# Patient Record
Sex: Male | Born: 1950 | Race: White | Hispanic: No | Marital: Single | Smoking: Current every day smoker
Health system: Southern US, Community
[De-identification: ages and names within clinical notes are randomized; demographics above are authoritative.]

## PROBLEM LIST (undated history)

## (undated) DIAGNOSIS — F329 Major depressive disorder, single episode, unspecified: Secondary | ICD-10-CM

## (undated) DIAGNOSIS — I10 Essential (primary) hypertension: Secondary | ICD-10-CM

## (undated) DIAGNOSIS — I639 Cerebral infarction, unspecified: Secondary | ICD-10-CM

## (undated) DIAGNOSIS — M869 Osteomyelitis, unspecified: Secondary | ICD-10-CM

## (undated) DIAGNOSIS — Z720 Tobacco use: Secondary | ICD-10-CM

## (undated) DIAGNOSIS — F32A Depression, unspecified: Secondary | ICD-10-CM

## (undated) DIAGNOSIS — E512 Wernicke's encephalopathy: Secondary | ICD-10-CM

## (undated) DIAGNOSIS — I739 Peripheral vascular disease, unspecified: Secondary | ICD-10-CM

## (undated) DIAGNOSIS — F419 Anxiety disorder, unspecified: Secondary | ICD-10-CM

## (undated) DIAGNOSIS — F04 Amnestic disorder due to known physiological condition: Secondary | ICD-10-CM

## (undated) DIAGNOSIS — E785 Hyperlipidemia, unspecified: Secondary | ICD-10-CM

## (undated) HISTORY — DX: Major depressive disorder, single episode, unspecified: F32.9

## (undated) HISTORY — DX: Osteomyelitis, unspecified: M86.9

## (undated) HISTORY — DX: Depression, unspecified: F32.A

## (undated) HISTORY — PX: FINGER SURGERY: SHX640

## (undated) HISTORY — PX: BELOW KNEE LEG AMPUTATION: SUR23

## (undated) HISTORY — DX: Cerebral infarction, unspecified: I63.9

## (undated) HISTORY — DX: Anxiety disorder, unspecified: F41.9

---

## 2015-11-05 ENCOUNTER — Encounter: Payer: Self-pay | Admitting: Emergency Medicine

## 2015-11-05 ENCOUNTER — Emergency Department
Admission: EM | Admit: 2015-11-05 | Discharge: 2015-11-07 | Disposition: A | Discharge status: Home / Self Care | Payer: Self-pay | Attending: Student in an Organized Health Care Education/Training Program | Admitting: Student in an Organized Health Care Education/Training Program

## 2015-11-05 ENCOUNTER — Emergency Department: Payer: Self-pay

## 2015-11-05 DIAGNOSIS — T7411XA Adult physical abuse, confirmed, initial encounter: Secondary | ICD-10-CM | POA: Insufficient documentation

## 2015-11-05 DIAGNOSIS — S0990XS Unspecified injury of head, sequela: Secondary | ICD-10-CM | POA: Insufficient documentation

## 2015-11-05 DIAGNOSIS — T7491XA Unspecified adult maltreatment, confirmed, initial encounter: Secondary | ICD-10-CM

## 2015-11-05 DIAGNOSIS — I10 Essential (primary) hypertension: Secondary | ICD-10-CM | POA: Insufficient documentation

## 2015-11-05 DIAGNOSIS — X58XXXS Exposure to other specified factors, sequela: Secondary | ICD-10-CM | POA: Insufficient documentation

## 2015-11-05 DIAGNOSIS — F17200 Nicotine dependence, unspecified, uncomplicated: Secondary | ICD-10-CM | POA: Insufficient documentation

## 2015-11-05 DIAGNOSIS — Z5181 Encounter for therapeutic drug level monitoring: Secondary | ICD-10-CM | POA: Insufficient documentation

## 2015-11-05 HISTORY — DX: Essential (primary) hypertension: I10

## 2015-11-05 LAB — URINALYSIS COMPLETE WITH MICROSCOPIC (ARMC ONLY)
Bacteria, UA: NONE SEEN
Bilirubin Urine: NEGATIVE
GLUCOSE, UA: NEGATIVE mg/dL
HGB URINE DIPSTICK: NEGATIVE
Ketones, ur: NEGATIVE mg/dL
Leukocytes, UA: NEGATIVE
Nitrite: NEGATIVE
PH: 6 (ref 5.0–8.0)
Protein, ur: 100 mg/dL — AB
Specific Gravity, Urine: 1.017 (ref 1.005–1.030)

## 2015-11-05 LAB — COMPREHENSIVE METABOLIC PANEL
ALBUMIN: 3.3 g/dL — AB (ref 3.5–5.0)
ALT: 37 U/L (ref 17–63)
AST: 34 U/L (ref 15–41)
Alkaline Phosphatase: 88 U/L (ref 38–126)
Anion gap: 8 (ref 5–15)
BILIRUBIN TOTAL: 0.5 mg/dL (ref 0.3–1.2)
BUN: 12 mg/dL (ref 6–20)
CO2: 23 mmol/L (ref 22–32)
CREATININE: 0.9 mg/dL (ref 0.61–1.24)
Calcium: 8.8 mg/dL — ABNORMAL LOW (ref 8.9–10.3)
Chloride: 104 mmol/L (ref 101–111)
GFR calc Af Amer: >60 mL/min (ref >60)
GFR calc non Af Amer: >60 mL/min (ref >60)
GLUCOSE: 103 mg/dL — AB (ref 65–99)
POTASSIUM: 3 mmol/L — AB (ref 3.5–5.1)
Sodium: 135 mmol/L (ref 135–145)
TOTAL PROTEIN: 7.1 g/dL (ref 6.5–8.1)

## 2015-11-05 LAB — CBC WITH DIFFERENTIAL/PLATELET
BASOS ABS: 0.1 10*3/uL (ref 0–0.1)
BASOS PCT: 1 %
Eosinophils Absolute: 0.2 10*3/uL (ref 0–0.7)
Eosinophils Relative: 3 %
HEMATOCRIT: 41.8 % (ref 40.0–52.0)
HEMOGLOBIN: 14.4 g/dL (ref 13.0–18.0)
Lymphocytes Relative: 24 %
Lymphs Abs: 2.3 10*3/uL (ref 1.0–3.6)
MCH: 35.1 pg — ABNORMAL HIGH (ref 26.0–34.0)
MCHC: 34.5 g/dL (ref 32.0–36.0)
MCV: 101.8 fL — AB (ref 80.0–100.0)
MONO ABS: 0.9 10*3/uL (ref 0.2–1.0)
Monocytes Relative: 10 %
NEUTROS ABS: 5.8 10*3/uL (ref 1.4–6.5)
NEUTROS PCT: 62 %
Platelets: 338 10*3/uL (ref 150–440)
RBC: 4.11 MIL/uL — AB (ref 4.40–5.90)
RDW: 16.4 % — AB (ref 11.5–14.5)
WBC: 9.4 10*3/uL (ref 3.8–10.6)

## 2015-11-05 LAB — URINE DRUG SCREEN, QUALITATIVE (ARMC ONLY)
Amphetamines, Ur Screen: NOT DETECTED
BARBITURATES, UR SCREEN: NOT DETECTED
BENZODIAZEPINE, UR SCRN: NOT DETECTED
Cannabinoid 50 Ng, Ur ~~LOC~~: POSITIVE — AB
Cocaine Metabolite,Ur ~~LOC~~: NOT DETECTED
MDMA (Ecstasy)Ur Screen: NOT DETECTED
METHADONE SCREEN, URINE: NOT DETECTED
Opiate, Ur Screen: NOT DETECTED
Phencyclidine (PCP) Ur S: NOT DETECTED
TRICYCLIC, UR SCREEN: NOT DETECTED

## 2015-11-05 LAB — ACETAMINOPHEN LEVEL

## 2015-11-05 LAB — ETHANOL

## 2015-11-05 LAB — PROTIME-INR
INR: 0.9
Prothrombin Time: 12.1 seconds (ref 11.4–15.2)

## 2015-11-05 LAB — SALICYLATE LEVEL

## 2015-11-05 MED ORDER — NICOTINE 21 MG/24HR TD PT24
21.0000 mg | MEDICATED_PATCH | Freq: Once | TRANSDERMAL | Status: AC
  Administered 2015-11-05: 21 mg via TRANSDERMAL
  Filled 2015-11-05: qty 1

## 2015-11-05 MED ORDER — ACETAMINOPHEN 500 MG PO TABS: 1000.0000 mg | ORAL_TABLET | Freq: Once | ORAL | Status: DC

## 2015-11-05 MED ORDER — VITAMIN B-1 100 MG PO TABS
100.0000 mg | ORAL_TABLET | Freq: Every day | ORAL | Status: DC
  Administered 2015-11-05 – 2015-11-07 (×3): 100 mg via ORAL
  Filled 2015-11-05 (×3): qty 1

## 2015-11-05 MED ORDER — FOLIC ACID 1 MG PO TABS
1.0000 mg | ORAL_TABLET | Freq: Once | ORAL | Status: AC
  Administered 2015-11-05: 1 mg via ORAL
  Filled 2015-11-05: qty 1

## 2015-11-05 MED ORDER — IBUPROFEN 600 MG PO TABS
600.0000 mg | ORAL_TABLET | Freq: Once | ORAL | Status: AC
  Administered 2015-11-05: 600 mg via ORAL
  Filled 2015-11-05: qty 1

## 2015-11-05 NOTE — ED Notes (Signed)
Spoke with patient, obtained permission to speak with daughter about patient status/care. Called patient's daughter for patient, patient currently on phone speaking to daughter.
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
## 2015-11-05 NOTE — ED Notes (Addendum)
Received call form DSS contact [name]. DSS would like to be contacted before patient is discharged to a facility with update on patient status/plan. Contact number [phone]

## 2015-11-05 NOTE — Care Management Note (Signed)
Case Management Note  Patient Details  Name: Maxwell Webb MRN: [id number] Date of Birth: [birth year]  Subjective/Objective:    Call placed  to APS .             Action/Plan:On call social worker to call Dr Roxan Hockeyobinson to discuss case.   Expected Discharge Date:                  Expected Discharge Plan:     In-House Referral:     Discharge planning Services     Post Acute Care Choice:    Choice offered to:     DME Arranged:    DME Agency:     HH Arranged:    HH Agency:     Status of Service:     If discussed at [employer] Meetings, dates discussed:    Additional Comments:  Caren MacadamMichelle Idil Maslanka, RN 11/05/2015, 6:24 PM
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
## 2015-11-05 NOTE — ED Notes (Signed)
Pt given snack and ginger ale 

## 2015-11-05 NOTE — ED Notes (Signed)
Pt bathed and dressed out.

## 2015-11-05 NOTE — ED Notes (Signed)
[facility] [place] to [phone]

## 2015-11-06 MED ORDER — GABAPENTIN 100 MG PO CAPS
100.0000 mg | ORAL_CAPSULE | Freq: Two times a day (BID) | ORAL | Status: DC
  Administered 2015-11-06 – 2015-11-07 (×3): 100 mg via ORAL
  Filled 2015-11-06 (×3): qty 1

## 2015-11-06 MED ORDER — PANTOPRAZOLE SODIUM 40 MG PO TBEC
40.0000 mg | DELAYED_RELEASE_TABLET | Freq: Every day | ORAL | Status: DC
  Administered 2015-11-06 – 2015-11-07 (×2): 40 mg via ORAL
  Filled 2015-11-06 (×2): qty 1

## 2015-11-06 MED ORDER — ATENOLOL 25 MG PO TABS
25.0000 mg | ORAL_TABLET | Freq: Every day | ORAL | Status: DC
  Administered 2015-11-06 – 2015-11-07 (×2): 25 mg via ORAL
  Filled 2015-11-06 (×2): qty 1

## 2015-11-06 MED ORDER — AMLODIPINE BESYLATE 5 MG PO TABS: 10.0000 mg | ORAL_TABLET | Freq: Every day | ORAL | Status: DC

## 2015-11-06 MED ORDER — CITALOPRAM HYDROBROMIDE 20 MG PO TABS
20.0000 mg | ORAL_TABLET | Freq: Every day | ORAL | Status: DC
  Administered 2015-11-06 – 2015-11-07 (×2): 20 mg via ORAL
  Filled 2015-11-06 (×2): qty 1

## 2015-11-06 MED ORDER — ADULT MULTIVITAMIN W/MINERALS CH
1.0000 | ORAL_TABLET | Freq: Every day | ORAL | Status: DC
  Administered 2015-11-06 – 2015-11-07 (×2): 1 via ORAL
  Filled 2015-11-06 (×2): qty 1

## 2015-11-06 NOTE — ED Notes (Signed)
Pt placed on bedpan and had moderate amount of semi soft stool. Peri care done.

## 2015-11-06 NOTE — [place] FL2 (Signed)
   MEDICAID FL2 LEVEL OF CARE SCREENING TOOL     IDENTIFICATION  Patient Name: Maxwell Webb Birthdate: May 01, 1950 Sex: male Admission Date (Current Location): 11/05/2015  [place]   Facility and Address:  [facility], [address], [place], [place] [address]      Provider Number: [id number]  Attending Physician Name and Address:  No att. providers found  Relative Name and Phone Number:       Current Level of Care: Hospital Recommended Level of Care: Assisted Living Facility, [facility] Prior Approval Number:   [id number] F Date Approved/Denied:   PASRR Number:    Discharge Plan: Domiciliary (Rest home)    Current Diagnoses: There are no active problems to display for this patient.   Orientation RESPIRATION BLADDER Height & Weight     Self, Situation, Place  Normal Incontinent Weight: 175 lb 14.8 oz (79.8 kg) Height:  5\' 8"  (172.7 cm)  BEHAVIORAL SYMPTOMS/MOOD NEUROLOGICAL BOWEL NUTRITION STATUS      Incontinent Diet (Normal)  AMBULATORY STATUS COMMUNICATION OF NEEDS Skin   Extensive Assist Verbally Skin abrasions, Bruising                       Personal Care Assistance Level of Assistance  Bathing, Feeding, Dressing, Total care Bathing Assistance: Limited assistance Feeding assistance: Limited assistance Dressing Assistance: Limited assistance Total Care Assistance: Limited assistance   Functional Limitations Info  Sight, Hearing, Speech Sight Info: Adequate Hearing Info: Adequate Speech Info: Adequate    SPECIAL CARE FACTORS FREQUENCY                       Contractures Contractures Info: Not present    Additional Factors Info                  Current Medications (11/06/2015):  This is the current hospital active medication list Current Facility-Administered Medications  Medication Dose Route Frequency Provider Last Rate Last Dose  . atenolol  (TENORMIN) tablet 25 mg  25 mg Oral Daily Loleta Roseory Forbach, MD      . citalopram (CELEXA) tablet 20 mg  20 mg Oral Daily Loleta Roseory Forbach, MD      . multivitamin with minerals tablet 1 tablet  1 tablet Oral Daily Loleta Roseory Forbach, MD      . nicotine (NICODERM CQ - dosed in mg/24 hours) patch 21 mg  21 mg Transdermal Once Willy EddyPatrick Robinson, MD   21 mg at 11/05/15 2028  . pantoprazole (PROTONIX) EC tablet 40 mg  40 mg Oral Daily Loleta Roseory Forbach, MD      . thiamine (VITAMIN B-1) tablet 100 mg  100 mg Oral Daily Willy EddyPatrick Robinson, MD   100 mg at 11/05/15 1813   No current outpatient prescriptions on file.     Discharge Medications: Please see discharge summary for a list of discharge medications.  Relevant Imaging Results:  Relevant Lab Results:   Additional Information  SSN [id number]  Cheron SchaumannBandi, Claudine M, [place]

## 2015-11-06 NOTE — ED Notes (Signed)
Discussed medications with patient. Pt reports taking lisinopril and simvastatin, however is unsure of the dosages. Pt reports his medications are purchased normally from either [facility] or [facility]. RN discussed medications with pharmacy tech [place] BeachBeth. [name] reports she will investigate further. MD York CeriseForbach informed.

## 2015-11-06 NOTE — ED Notes (Signed)
Pt eating lunch tray  

## 2015-11-06 NOTE — ED Notes (Signed)
Pt given ice cream per request at this time.
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
## 2015-11-06 NOTE — [place] FL2 (Deleted)
   MEDICAID FL2 LEVEL OF CARE SCREENING TOOL     IDENTIFICATION  Patient Name: Maxwell Webb Birthdate: [birth year] Sex: male Admission Date (Current Location): 11/05/2015  [place]   Facility and Address:  [facility], [address], [place], [place] [address]      Provider Number: [id number]  Attending Physician Name and Address:  No att. providers found  Relative Name and Phone Number:       Current Level of Care: Hospital Recommended Level of Care: Assisted Living Facility, Skilled Nursing Facility Prior Approval Number:    Date Approved/Denied:   PASRR Number:    Discharge Plan: Domiciliary (Rest home)    Current Diagnoses: There are no active problems to display for this patient.   Orientation RESPIRATION BLADDER Height & Weight     Self, Situation, Place  Normal Incontinent Weight: 175 lb 14.8 oz (79.8 kg) Height:  5\' 8"  (172.7 cm)  BEHAVIORAL SYMPTOMS/MOOD NEUROLOGICAL BOWEL NUTRITION STATUS      Incontinent Diet (Normal)  AMBULATORY STATUS COMMUNICATION OF NEEDS Skin   Extensive Assist Verbally Skin abrasions, Bruising                       Personal Care Assistance Level of Assistance  Bathing, Feeding, Dressing, Total care Bathing Assistance: Limited assistance Feeding assistance: Limited assistance Dressing Assistance: Limited assistance Total Care Assistance: Limited assistance   Functional Limitations Info  Sight, Hearing, Speech Sight Info: Adequate Hearing Info: Adequate Speech Info: Adequate    SPECIAL CARE FACTORS FREQUENCY                       Contractures Contractures Info: Not present    Additional Factors Info                  Current Medications (11/06/2015):  This is the current hospital active medication list Current Facility-Administered Medications  Medication Dose Route Frequency Provider Last Rate Last Dose  . atenolol (TENORMIN) tablet 25  mg  25 mg Oral Daily Loleta Roseory Forbach, MD   25 mg at 11/06/15 [id number]  . citalopram (CELEXA) tablet 20 mg  20 mg Oral Daily Loleta Roseory Forbach, MD   20 mg at 11/06/15 [id number]  . gabapentin (NEURONTIN) capsule 100 mg  100 mg Oral BID Myrna Blazeravid Matthew Schaevitz, MD   100 mg at 11/06/15 [id number]  . multivitamin with minerals tablet 1 tablet  1 tablet Oral Daily Loleta Roseory Forbach, MD   1 tablet at 11/06/15 [phone] [id number]  . nicotine (NICODERM CQ - dosed in mg/24 hours) patch 21 mg  21 mg Transdermal Once Willy EddyPatrick Robinson, MD   21 mg at 11/05/15 2028  . pantoprazole (PROTONIX) EC tablet 40 mg  40 mg Oral Daily Loleta Roseory Forbach, MD   40 mg at 11/06/15 [id number]  . thiamine (VITAMIN B-1) tablet 100 mg  100 mg Oral Daily Willy EddyPatrick Robinson, MD   100 mg at 11/06/15 [id number]   No current outpatient prescriptions on file.     Discharge Medications: Please see discharge summary for a list of discharge medications.  Relevant Imaging Results:  Relevant Lab Results:   Additional Information  SSN [id number]  Cheron SchaumannBandi, Ysabelle Goodroe M, [place]

## 2015-11-06 NOTE — ED Notes (Signed)
Reported patient's BP to MD York CeriseForbach.

## 2015-11-06 NOTE — ED Notes (Signed)
Pt given a gingerale upon request, no distress noted, pt also given the phone to call his oldest brother, pt states that he is trying to figure out where he is going to go now. Pt states that he spoke with a Child psychotherapistsocial worker earlier as well as a group home owner. Pt states that he can not go to the group home, states that there are 12 people who live there and he would have a roommate.
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
## 2015-11-06 NOTE — Progress Notes (Signed)
LCSW met patient and Group Home Provider Ms Jenita Seashore [phone] or cell phone [phone]  [facility]. She and patient met each other and he understands he needs a place to stay that will be safe  Such as this family care home. LCSW provided information to [facility] and she will call either tonight or tomorrow with a definite answer.  LCSW introduced DSS-APS Marga Melnick to patient who interviewed him. There were several different stories today and it appeared the patient has had real issues with memory. The APS is agreeable with him being placed in this family care home as he already has a patient who is an amputee  LCSW will provide handoff for weekday SW Matthew Saras to follow up  [employer] LCSW [phone]

## 2015-11-06 NOTE — ED Notes (Signed)
Pt talking with social work at this time.

## 2015-11-06 NOTE — Progress Notes (Signed)
LCSW called [facility] to see if they received information for possible placmenet.  A group home provider will be coming out at 3 pm to meet patient to see if they can support this patient.   Delta Air LinesClaudine Franchot Pollitt LCSW [phone]
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
## 2015-11-06 NOTE — ED Notes (Signed)
Pt eating breakfast at this time.  

## 2015-11-07 MED ORDER — IBUPROFEN 600 MG PO TABS
600.0000 mg | ORAL_TABLET | Freq: Once | ORAL | Status: AC
  Administered 2015-11-07: 600 mg via ORAL

## 2015-11-07 MED ORDER — NICOTINE 21 MG/24HR TD PT24
MEDICATED_PATCH | TRANSDERMAL | Status: AC
  Filled 2015-11-07: qty 1

## 2015-11-07 MED ORDER — TUBERCULIN PPD 5 UNIT/0.1ML ID SOLN
5.0000 [IU] | Freq: Once | INTRADERMAL | Status: DC
  Administered 2015-11-07: 5 [IU] via INTRADERMAL
  Filled 2015-11-07: qty 0.1

## 2015-11-07 MED ORDER — NICOTINE 21 MG/24HR TD PT24
21.0000 mg | MEDICATED_PATCH | Freq: Every day | TRANSDERMAL | Status: DC
  Administered 2015-11-07: 21 mg via TRANSDERMAL

## 2015-11-07 MED ORDER — TRAZODONE HCL 50 MG PO TABS
50.0000 mg | ORAL_TABLET | Freq: Every day | ORAL | Status: DC
  Administered 2015-11-07: 50 mg via ORAL
  Filled 2015-11-07: qty 1

## 2015-11-07 MED ORDER — IBUPROFEN 600 MG PO TABS
ORAL_TABLET | ORAL | Status: AC
  Filled 2015-11-07: qty 1

## 2015-11-07 NOTE — ED Notes (Signed)
Pt provided chocolate ice cream and graham crackers with sprite to drink. States he has a headache and wanted to eat something.

## 2015-11-07 NOTE — ED Notes (Signed)
Breakfast in room patient  Still sleeping at  This time

## 2015-11-07 NOTE — Clinical Social Work Placement (Addendum)
   CLINICAL SOCIAL WORK PLACEMENT  NOTE  Date:  11/07/2015  Patient Details  Name: Maxwell Webb MRN: [id number] Date of Birth: [birth year]  Clinical Social Work is seeking post-discharge placement for this patient at the [facility] level of care (*CSW will initial, date and re-position this form in  chart as items are completed):      Patient/family provided with [facility] Clinical Social Work Department's list of facilities offering this level of care within the geographic area requested by the patient (or if unable, by the patient's family).      Patient/family informed of their freedom to choose among providers that offer the needed level of care, that participate in Medicare, Medicaid or managed care program needed by the patient, have an available bed and are willing to accept the patient.      Patient/family informed of [facility]'s ownership interest in [facility] and [facility], as well as of the fact that they are under no obligation to receive care at these facilities.  PASRR submitted to EDS on       PASRR number received on       Existing PASRR number confirmed on 11/07/15     FL2 transmitted to all facilities in geographic area requested by pt/family on       FL2 transmitted to all facilities within larger geographic area on       Patient informed that his/her managed care company has contracts with or will negotiate with certain facilities, including the following:            Patient/family informed of bed offers received.  [facility]     Physician recommends and patient chooses bed at      [facility]  [facility] on 11/07/15.  Patient to be transferred to facility by [facility] staff will transport pt     Patient family notified on 11/07/15 of transfer.  Name of family member notified:  [name] (DSS social worker) [phone]     PHYSICIAN  Please prepare discharge  summaries and medications  Please sign FL2  Additional Comment:    _______________________________________________ Jonathon JordanLynn B Appollonia Klee, LCSWA 11/07/2015, 3:21 PM

## 2015-11-07 NOTE — ED Provider Notes (Signed)
-----------------------------------------   7:44 AM on 11/07/2015 -----------------------------------------   Blood pressure (!) 138/106, pulse 82, temperature 98.3 F (36.8 C), temperature source Oral, resp. rate 20, height 5\' 8"  (1.727 m), weight 175 lb 14.8 oz (79.8 kg), SpO2 97 %.  The patient had no acute events since last update.  Calm and cooperative at this time.  The patient is awaiting social work consult as well as a psych consult     Rebecka ApleyAllison P Anaalicia Reimann, MD 11/07/15 [phone]

## 2015-11-07 NOTE — ED Provider Notes (Signed)
-----------------------------------------   1:28 PM on 11/07/2015 -----------------------------------------  It appears the patient has been seen by specialist on-call may recommend discharge home. We are currently working with social work to arrange an appropriate care facility for the patient such as a group home.   Minna AntisKevin Rogina Schiano, MD 11/07/15 1328

## 2015-11-07 NOTE — ED Notes (Signed)
Pt wheeled to lobby, in care of Flint MelterSharon Bruce [facility]. In no distress when he was left in lobby, wearing blue scrubs, has belongings back and D/C papers

## 2015-11-07 NOTE — ED Notes (Signed)
Pt provided another ice cream, stated he was still hungry.

## 2015-11-07 NOTE — ED Notes (Signed)
Pt wheeled out to lobby with pet belongings bag and D/C papers.

## 2015-11-07 NOTE — ED Notes (Addendum)
Maxwell Webb, ED medic in room to help pt get dressed if he needs help.   Went back to triage note, stated pt came in without clothes. Will provide blue scrubs for pt. Medicines that were in pt belongings bag given back to pt.

## 2015-11-07 NOTE — ED Provider Notes (Signed)
-----------------------------------------   2:56 PM on 11/07/2015 -----------------------------------------  The patient has been accepted to a group home. They'll be here around 4 PM to pick up the patient. A PPD has been placed and they will read at the group home in 48 hours.   Minna AntisKevin [place] Agent, MD 11/07/15 [phone]

## 2015-11-07 NOTE — ED Notes (Signed)
Lunch was given to patient 

## 2015-11-29 ENCOUNTER — Encounter: Payer: Self-pay | Admitting: Emergency Medicine

## 2015-11-29 ENCOUNTER — Emergency Department
Admission: EM | Admit: 2015-11-29 | Discharge: 2015-12-01 | Disposition: A | Discharge status: Home / Self Care | Payer: Self-pay | Attending: Emergency Medicine | Admitting: Emergency Medicine

## 2015-11-29 DIAGNOSIS — I10 Essential (primary) hypertension: Secondary | ICD-10-CM | POA: Insufficient documentation

## 2015-11-29 DIAGNOSIS — F339 Major depressive disorder, recurrent, unspecified: Secondary | ICD-10-CM | POA: Insufficient documentation

## 2015-11-29 DIAGNOSIS — F17200 Nicotine dependence, unspecified, uncomplicated: Secondary | ICD-10-CM | POA: Insufficient documentation

## 2015-11-29 DIAGNOSIS — E512 Wernicke's encephalopathy: Secondary | ICD-10-CM

## 2015-11-29 DIAGNOSIS — F1290 Cannabis use, unspecified, uncomplicated: Secondary | ICD-10-CM | POA: Insufficient documentation

## 2015-11-29 DIAGNOSIS — Z5181 Encounter for therapeutic drug level monitoring: Secondary | ICD-10-CM | POA: Insufficient documentation

## 2015-11-29 LAB — SALICYLATE LEVEL

## 2015-11-29 LAB — URINE DRUG SCREEN, QUALITATIVE (ARMC ONLY)
AMPHETAMINES, UR SCREEN: NOT DETECTED
Barbiturates, Ur Screen: NOT DETECTED
Benzodiazepine, Ur Scrn: NOT DETECTED
Cannabinoid 50 Ng, Ur ~~LOC~~: NOT DETECTED
Cocaine Metabolite,Ur ~~LOC~~: NOT DETECTED
MDMA (ECSTASY) UR SCREEN: NOT DETECTED
Methadone Scn, Ur: NOT DETECTED
Opiate, Ur Screen: NOT DETECTED
Phencyclidine (PCP) Ur S: NOT DETECTED
TRICYCLIC, UR SCREEN: NOT DETECTED

## 2015-11-29 LAB — CBC
HCT: 40.7 % (ref 40.0–52.0)
Hemoglobin: 14 g/dL (ref 13.0–18.0)
MCH: 34.9 pg — ABNORMAL HIGH (ref 26.0–34.0)
MCHC: 34.4 g/dL (ref 32.0–36.0)
MCV: 101.2 fL — ABNORMAL HIGH (ref 80.0–100.0)
PLATELETS: 259 10*3/uL (ref 150–440)
RBC: 4.02 MIL/uL — AB (ref 4.40–5.90)
RDW: 15.1 % — ABNORMAL HIGH (ref 11.5–14.5)
WBC: 10.5 10*3/uL (ref 3.8–10.6)

## 2015-11-29 LAB — COMPREHENSIVE METABOLIC PANEL
ALT: 21 U/L (ref 17–63)
AST: 30 U/L (ref 15–41)
Albumin: 3.7 g/dL (ref 3.5–5.0)
Alkaline Phosphatase: 77 U/L (ref 38–126)
Anion gap: 7 (ref 5–15)
BUN: 11 mg/dL (ref 6–20)
CHLORIDE: 106 mmol/L (ref 101–111)
CO2: 26 mmol/L (ref 22–32)
Calcium: 9.4 mg/dL (ref 8.9–10.3)
Creatinine, Ser: 0.88 mg/dL (ref 0.61–1.24)
Glucose, Bld: 162 mg/dL — ABNORMAL HIGH (ref 65–99)
POTASSIUM: 2.7 mmol/L — AB (ref 3.5–5.1)
SODIUM: 139 mmol/L (ref 135–145)
Total Bilirubin: 0.5 mg/dL (ref 0.3–1.2)
Total Protein: 7.1 g/dL (ref 6.5–8.1)

## 2015-11-29 LAB — ETHANOL

## 2015-11-29 LAB — ACETAMINOPHEN LEVEL: Acetaminophen (Tylenol), Serum: <10 ug/mL — ABNORMAL LOW (ref 10–30)

## 2015-11-29 MED ORDER — NICOTINE 21 MG/24HR TD PT24
MEDICATED_PATCH | TRANSDERMAL | Status: AC
Start: 2015-11-29 — End: 2015-11-30
  Administered 2015-11-29: 21 mg via TRANSDERMAL
  Filled 2015-11-29: qty 1

## 2015-11-29 MED ORDER — NICOTINE 21 MG/24HR TD PT24
21.0000 mg | MEDICATED_PATCH | Freq: Once | TRANSDERMAL | Status: AC
  Administered 2015-11-29: 21 mg via TRANSDERMAL
  Filled 2015-11-29: qty 1

## 2015-11-29 MED ORDER — POTASSIUM CHLORIDE 20 MEQ PO PACK
40.0000 meq | PACK | Freq: Once | ORAL | Status: AC
  Administered 2015-11-29: 40 meq via ORAL

## 2015-11-29 MED ORDER — POTASSIUM CHLORIDE 20 MEQ PO PACK
PACK | ORAL | Status: AC
  Administered 2015-11-29: 40 meq via ORAL
  Filled 2015-11-29: qty 2

## 2015-11-29 NOTE — ED Triage Notes (Signed)
Pt to ed from group home with c/o depression and took four amlodipine today hoping it would make feel different. Pt is here voluntary. NAD. Pt with bilateral BTK amputation. Pt denies any thoughts of hurting himself or other and states he has "everything to live for".

## 2015-11-29 NOTE — ED Notes (Signed)
[place] is consulting at this time  

## 2015-11-29 NOTE — ED Notes (Signed)
Meal provided along with an extra drink

## 2015-11-29 NOTE — Consult Note (Signed)
[facility] Consult   Reason for Consult:  Consult for 65 year old man with a history of chronic memory impairment who comes to the emergency room after taking 2 extra blood pressure pills Referring Physician:  Cinda Quest Patient Identification: Maxwell Webb MRN:  [id number] Principal Diagnosis: Wernicke's encephalopathy Diagnosis:   Patient Active Problem List   Diagnosis Date Noted  . Wernicke's encephalopathy [E51.2] 11/29/2015    Total Time spent with patient: 1 hour  Subjective:   Maxwell Webb is a 65 y.o. male patient admitted with "I took too amlodipine as this I could get cheap thrills from that".  HPI:  Patient interviewed chart reviewed. This is a 65 year old man was brought over here from his group home after taking a suppose it overdose of blood pressure medicine. He had told some people earlier that it was 4 of his amlodipine. He tells me now that it was only 2 of them. He has consistently stated that he was not trying to hurt himself or kill himself. He says that he thought that maybe it would "change his outlook". It sounds like there might of also been some attempt to manipulate them into bringing him to the hospital. Patient says his mood has been bad strictly because of his living at the group home where he is right now. He's been there for about 3 weeks and evidently does not like it. Eyes any suicidal or homicidal thoughts. Denies any psychotic symptoms. Not currently drinking or using any drugs. He has told various providers various out rages things at different times during his visit to the emergency room this evening but at no point threatened to harm himself.  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 He is in a wheelchair. Has a history of dyslipidemia. 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  Social history: Currently currently he is residing in a group home. He's been there for about 3 weeks after coming to the emergency room homeless. Evidently does not like it. Patient is clearly confused and confabulates several stories about his social situation.  Past Psychiatric History: No history of past suicide attempts. Denies any history of psychiatric hospitalization. Does not appear to be on psychiatric medication.  Risk to Self: Is patient at risk for suicide?: No Risk to Others:   Prior Inpatient Therapy:   Prior Outpatient Therapy:    Past Medical History:  Past Medical History:  Diagnosis Date  . Hypertension     Past Surgical History:  Procedure Laterality Date  . BELOW KNEE LEG AMPUTATION Bilateral    Family History: No family history on file. Family Psychiatric  History: No known family history Social History:  History  Alcohol Use  . Yes     History  Drug Use  . Types: Marijuana    Social History   Social History  . Marital status: Single    Spouse name: N/A  . Number of children: N/A  . Years of education: N/A   Social History Main Topics  . Smoking status: Current Every Day Smoker  . Smokeless tobacco: Never Used  . Alcohol use Yes  . Drug use:     Types: Marijuana  . Sexual activity: Not Asked   Other Topics Concern  . None   Social History Narrative  . None   Additional Social History:    Allergies:  No Known Allergies  Labs:  Results for orders placed or performed during the hospital encounter of 11/29/15 (from the past 48 hour(s))  Comprehensive metabolic panel     Status: Abnormal   Collection Time: 11/29/15  4:55 PM  Result Value Ref Range   Sodium 139 135 - 145 mmol/L   Potassium 2.7 (LL) 3.5 - 5.1 mmol/L    Comment: CRITICAL RESULT CALLED TO, READ BACK BY AND VERIFIED WITH AMY TEAGUE @ 1726 11/29/15 BY TCH    Chloride 106 101 - 111 mmol/L   CO2 26 22 - 32 mmol/L   Glucose, Bld 162 (H) 65 - 99 mg/dL   BUN 11 6 -  20 mg/dL   Creatinine, Ser 2.31 0.61 - 1.24 mg/dL   Calcium 9.4 8.9 - 23.9 mg/dL   Total Protein 7.1 6.5 - 8.1 g/dL   Albumin 3.7 3.5 - 5.0 g/dL   AST 30 15 - 41 U/L   ALT 21 17 - 63 U/L   Alkaline Phosphatase 77 38 - 126 U/L   Total Bilirubin 0.5 0.3 - 1.2 mg/dL   GFR calc non Af Amer >60 >60 mL/min   GFR calc Af Amer >60 >60 mL/min    Comment: (NOTE) The eGFR has been calculated using the CKD EPI equation. This calculation has not been validated in all clinical situations. eGFR's persistently <60 mL/min signify possible Chronic Kidney Disease.    Anion gap 7 5 - 15  Ethanol     Status: None   Collection Time: 11/29/15  4:55 PM  Result Value Ref Range   Alcohol, Ethyl (B) <5 <5 mg/dL    Comment:        LOWEST DETECTABLE LIMIT FOR SERUM ALCOHOL IS 5 mg/dL FOR MEDICAL PURPOSES ONLY   Salicylate level     Status: None   Collection Time: 11/29/15  4:55 PM  Result Value Ref Range   Salicylate Lvl <7.0 2.8 - 30.0 mg/dL  Acetaminophen level     Status: Abnormal   Collection Time: 11/29/15  4:55 PM  Result Value Ref Range   Acetaminophen (Tylenol), Serum <10 (L) 10 - 30 ug/mL    Comment:        THERAPEUTIC CONCENTRATIONS VARY SIGNIFICANTLY. A RANGE OF 10-30 ug/mL MAY BE AN EFFECTIVE CONCENTRATION FOR MANY PATIENTS. HOWEVER, SOME ARE BEST TREATED AT CONCENTRATIONS OUTSIDE THIS RANGE. ACETAMINOPHEN CONCENTRATIONS >150 ug/mL AT 4 HOURS AFTER INGESTION AND >50 ug/mL AT 12 HOURS AFTER INGESTION ARE OFTEN ASSOCIATED WITH TOXIC REACTIONS.   cbc     Status: Abnormal   Collection Time: 11/29/15  4:55 PM  Result Value Ref Range   WBC 10.5 3.8 - 10.6 K/uL   RBC 4.02 (L) 4.40 - 5.90 MIL/uL   Hemoglobin 14.0 13.0 - 18.0 g/dL   HCT 71.4 10.4 - 85.3 %   MCV 101.2 (H) 80.0 - 100.0 fL   MCH 34.9 (H) 26.0 - 34.0 pg   MCHC 34.4 32.0 - 36.0 g/dL   RDW 92.9 (H) 72.9 - 10.9 %   Platelets 259 150 - 440 K/uL    No current facility-administered medications for this encounter.    No  current outpatient prescriptions on file.    Musculoskeletal: Strength & Muscle Tone: within normal limits Gait & Station: unable to stand Patient leans: N/A  Psychiatric Specialty Exam: Physical Exam  Constitutional: He appears well-developed and well-nourished.  HENT:  Head: Normocephalic and atraumatic.  Eyes: Conjunctivae are normal. Pupils are equal, round, and reactive to light.  Neck: Normal range  of motion.  Cardiovascular: Normal heart sounds.   Respiratory: Effort normal.  GI: Soft.  Musculoskeletal:       Legs: Neurological: He is alert.  Skin: Skin is warm and dry.  Psychiatric: His speech is normal. His affect is blunt. Thought content is not paranoid. He expresses impulsivity. He expresses no homicidal and no suicidal ideation. He exhibits abnormal recent memory and abnormal remote memory. He is inattentive.    Review of Systems  Constitutional: Negative.   HENT: Negative.   Eyes: Negative.   Respiratory: Negative.   Cardiovascular: Negative.   Gastrointestinal: Negative.   Musculoskeletal: Negative.   Skin: Negative.   Neurological: Negative.   Psychiatric/Behavioral: Positive for memory loss. Negative for depression, hallucinations, substance abuse and suicidal ideas. The patient is not nervous/anxious and does not have insomnia.     Blood pressure (!) 160/101, pulse (!) 115, temperature 97.9 F (36.6 C), resp. rate 20, height '6\' 8"'$  (2.032 m), weight 95.3 kg (210 lb), SpO2 96 %.Body mass index is 23.07 kg/m.  General Appearance: Fairly Groomed  Eye Contact:  Fair  Speech:  Normal Rate  Volume:  Normal  Mood:  Euthymic  Affect:  Constricted  Thought Process:  Goal Directed  Orientation:  Other:  He was not able to give [place] the correct year. He did know that he was in the hospital.  Thought Content:  Tangential  Suicidal Thoughts:  No  Homicidal Thoughts:  No  Memory:  Immediate;   Fair Recent;   Poor Remote;   Fair  Judgement:  Impaired  Insight:   Shallow  Psychomotor Activity:  Decreased  Concentration:  Concentration: Fair  Recall:  Poor  Fund of Knowledge:  Fair  Language:  Fair  Akathisia:  No  Handed:  Right  AIMS (if indicated):     Assets:  Financial Resources/Insurance Housing Social Support  ADL's:  Intact  Cognition:  Impaired,  Mild  Sleep:        Treatment Plan Summary: Plan This is a 65 year old man who presents to the hospital after taking a tiny overdose of pills without any evidence that he was actually trying to hurt himself. His affect is calm and pleasant. He clearly has chronic cognitive problems but does not appear to be at any risk to harm himself or others. Does not meet commitment criteria. Does not require any specific psychiatric hospitalization. I suggested his group home probably could be working with him better to meet his needs and keep him calm. No need for any prescriptions. He can be discharged from the emergency room. Case reviewed with the ER physician.  Disposition: Patient does not meet criteria for psychiatric inpatient admission. Supportive therapy provided about ongoing stressors.  Alethia Berthold, MD 11/29/2015 6:40 PM

## 2015-11-29 NOTE — ED Provider Notes (Signed)
[facility] Emergency Department Provider Note   ____________________________________________   First MD Initiated Contact with Patient 11/29/15 1722     (approximate)  I have reviewed the triage vital signs and the nursing notes.   HISTORY  Chief Complaint Depression   HPI Maxwell Webb is a 65 y.o. male patient who has a history of depression and substance induced mood disorder reports he was in a group home with run by per a person he knew and the person's daughter put a loaded check onto his head so he got in his motorized wheelchair and drove out the door and down the road and the police picked him up for obstructing traffic. Patient says he feels, depressed and blue. He is not suicidal. He is not homicidal. He says everything else is okay otherwise.   Past Medical History:  Diagnosis Date  . Hypertension     There are no active problems to display for this patient.   Past Surgical History:  Procedure Laterality Date  . BELOW KNEE LEG AMPUTATION Bilateral     Prior to Admission medications   Not on File    Allergies Patient has no known allergies.  No family history on file.  Social History Social History  Substance Use Topics  . Smoking status: Current Every Day Smoker  . Smokeless tobacco: Never Used  . Alcohol use Yes    Review of Systems Constitutional: No fever/chills Eyes: No visual changes. ENT: No sore throat. Cardiovascular: Denies chest pain. Respiratory: Denies shortness of breath. Gastrointestinal: No abdominal pain.  No nausea, no vomiting.  No diarrhea.  No constipation. Genitourinary: Negative for dysuria. Musculoskeletal: Negative for back pain.  10-point ROS otherwise negative.  ____________________________________________   PHYSICAL EXAM:  VITAL SIGNS: ED Triage Vitals [11/29/15 1642]  Enc Vitals Group     BP (!) 160/101     Pulse Rate (!) 115     Resp 20     Temp 97.9 F (36.6 C)     Temp src        SpO2 96 %     Weight 210 lb (95.3 kg)     Height 6\' 8"  (2.032 m)     Head Circumference      Peak Flow      Pain Score 4     Pain Loc      Pain Edu?      Excl. in GC?     Constitutional: Alert and oriented. Well appearing and in no acute distress. Eyes: Conjunctivae are normal. PERRL. EOMI. Head: Atraumatic. Nose: No congestion/rhinnorhea. Mouth/Throat: Mucous membranes are moist.  Oropharynx non-erythematous. Neck: No stridor.   Cardiovascular: Normal rate, regular rhythm. Grossly normal heart sounds.  Good peripheral circulation. Respiratory: Normal respiratory effort.  No retractions. Lungs CTAB. Gastrointestinal: Soft and nontender. No distention. No abdominal bruits. No CVA tenderness. Musculoskeletal: No lower extremity tenderness nor edema.  No joint effusions.Patient has bilateral BKA's.  ____________________________________________   LABS (all labs ordered are listed, but only abnormal results are displayed)  Labs Reviewed  COMPREHENSIVE METABOLIC PANEL - Abnormal; Notable for the following:       Result Value   Potassium 2.7 (*)    Glucose, Bld 162 (*)    All other components within normal limits  CBC - Abnormal; Notable for the following:    RBC 4.02 (*)    MCV 101.2 (*)    MCH 34.9 (*)    RDW 15.1 (*)    All other components  within normal limits  ETHANOL  SALICYLATE LEVEL  ACETAMINOPHEN LEVEL  URINE DRUG SCREEN, QUALITATIVE ([facility] ONLY)   ____________________________________________  EKG   ____________________________________________  RADIOLOGY   ____________________________________________   PROCEDURES  Procedure(s) performed:  Procedures  Critical Care performed: ____________________________________________   INITIAL IMPRESSION / ASSESSMENT AND PLAN / ED COURSE  Pertinent labs & imaging results that were available during my care of the patient were reviewed by me and considered in my medical decision making (see chart for  details).   Clinical Course      ____________________________________________   FINAL CLINICAL IMPRESSION(S) / ED DIAGNOSES  Final diagnoses:  None      NEW MEDICATIONS STARTED DURING THIS VISIT:  New Prescriptions   No medications on file     Note:  This document was prepared using Dragon voice recognition software and may include unintentional dictation errors.    Arnaldo NatalPaul F Mallery Harshman, MD 11/29/15 [phone]

## 2015-11-29 NOTE — ED Notes (Signed)

## 2015-11-30 MED ORDER — NAPROXEN 500 MG PO TABS
500.0000 mg | ORAL_TABLET | Freq: Once | ORAL | Status: AC
  Administered 2015-11-30: 500 mg via ORAL
  Filled 2015-11-30: qty 1

## 2015-11-30 MED ORDER — LORAZEPAM 0.5 MG PO TABS
0.5000 mg | ORAL_TABLET | Freq: Once | ORAL | Status: AC
  Administered 2015-11-30: 0.5 mg via ORAL
  Filled 2015-11-30: qty 1

## 2015-11-30 MED ORDER — METOCLOPRAMIDE HCL 10 MG PO TABS
10.0000 mg | ORAL_TABLET | Freq: Once | ORAL | Status: AC
  Administered 2015-11-30: 10 mg via ORAL
  Filled 2015-11-30: qty 1

## 2015-11-30 MED ORDER — IBUPROFEN 600 MG PO TABS
600.0000 mg | ORAL_TABLET | Freq: Once | ORAL | Status: AC
  Administered 2015-11-30: 600 mg via ORAL

## 2015-11-30 MED ORDER — NICOTINE 21 MG/24HR TD PT24
21.0000 mg | MEDICATED_PATCH | Freq: Once | TRANSDERMAL | Status: DC
  Administered 2015-11-30: 21 mg via TRANSDERMAL

## 2015-11-30 MED ORDER — LORAZEPAM 0.5 MG PO TABS
0.5000 mg | ORAL_TABLET | Freq: Four times a day (QID) | ORAL | Status: DC | PRN
  Administered 2015-12-01: 0.5 mg via ORAL
  Filled 2015-11-30: qty 1

## 2015-11-30 MED ORDER — IBUPROFEN 600 MG PO TABS
ORAL_TABLET | ORAL | Status: AC
  Administered 2015-11-30: 600 mg via ORAL
  Filled 2015-11-30: qty 1

## 2015-11-30 NOTE — ED Notes (Signed)
Pt given bedtime snack  

## 2015-11-30 NOTE — Progress Notes (Signed)
LCSW received call from APS [name]. Reports group home will not accept patient back at this time. [name] is actively working on Press photographerfiling for guardianship for patient and a new ALF.  He has requested an FL2 and which LCSW has completed and placed for MD to sign (assigned to [place])   [name] will follow up once he has spoken with ALFs regarding placement. Patient has income of $4600.00 a month to pay for ALF placement as there are no other payment sources.  Will continue to follow and assist with disposition.  Deretha EmoryHannah Anneli Bing LCSW, MSW Clinical Social Work: Optician, dispensingystem Wide Float Coverage for :  [phone]

## 2015-11-30 NOTE — ED Notes (Signed)
Social worker Printmaker([name]) came to see patient. Will attempt to find placement for patient. Dr. Sharma CovertNorman spoke to patient earlier about placement and being voluntary. Patient willing to see what social worker can do and then decide next course of action. Attempted to call owner of [facility] [name]([name]) with no answer. Social worker to follow up with owner to see if patient will be accepted back at her facility. Patient was to be discharged from this facility on 12/6 prior to this incident.

## 2015-11-30 NOTE — ED Notes (Signed)
Pt becoming agitated about having to stay at the hospital - reassured pt that his SW was working on placement - discussed with pt his frustration with not receiving medication and what he perceives as no one caring about him - pt seemed calmer after conversation

## 2015-11-30 NOTE — ED Notes (Signed)
Pt given medication - remains slightly frustrated but happy with effort to give him his medication - reviewed with pt again about SW working on placement - his short term memory seems impaired at this time

## 2015-11-30 NOTE — ED Notes (Addendum)
Pt is agitated and c/o headache with 8/10 pain and being anxious - he is requesting his belongings and that he be allowed to get a wheelchair and go outside to "have a smoke and a beer" - he states he has his own home to go to and he wants to go there - requesting to talk to SW - explained rules of the BHU and that he could not have his belongings at this time - explained to pt that this was a non-smoking facility and that alcohol was not allowed - discussed with pt again that SW was working on placement for him and that I would talk to the provider about obtaining medication for his headache and his anxiety - pt agrees with plan - this nurse spoke with provider about situation and obtained VO for medications

## 2015-11-30 NOTE — [place] FL2 (Signed)
   MEDICAID FL2 LEVEL OF CARE SCREENING TOOL     IDENTIFICATION  Patient Name: Maxwell Webb Birthdate: Nov 18, 1950 Sex: male Admission Date (Current Location): 11/29/2015  [facility] [place]   Facility and Address:  [facility], [address], [place], [place] [address]      Provider Number: [id number]  Attending Physician Name and Address:  No att. providers found  Relative Name and Phone Number:       Current Level of Care: Hospital Recommended Level of Care: Assisted Living Facility Prior Approval Number:    Date Approved/Denied:   PASRR Number:    Discharge Plan: Other (Comment) (Assisted Living Facility)    Current Diagnoses: Patient Active Problem List   Diagnosis Date Noted  . Wernicke's encephalopathy 11/29/2015    Orientation RESPIRATION BLADDER Height & Weight     Self, Situation, Place  Normal Incontinent Weight: 210 lb (95.3 kg) Height:  6\' 8"  (203.2 cm)  BEHAVIORAL SYMPTOMS/MOOD NEUROLOGICAL BOWEL NUTRITION STATUS      Incontinent Diet (regular)  AMBULATORY STATUS COMMUNICATION OF NEEDS Skin   Limited Assist Verbally Normal                       Personal Care Assistance Level of Assistance  Bathing, Feeding, Dressing Bathing Assistance: Limited assistance Feeding assistance: Limited assistance Dressing Assistance: Limited assistance     Functional Limitations Info  Sight, Hearing, Speech Sight Info: Adequate Hearing Info: Adequate Speech Info: Adequate    SPECIAL CARE FACTORS FREQUENCY                       Contractures Contractures Info: Not present    Additional Factors Info  Code Status, Allergies Code Status Info: Full Code Allergies Info: NKA           Current Medications (11/30/2015):  This is the current hospital active medication list Current Facility-Administered Medications  Medication Dose Route Frequency Provider Last Rate Last Dose  . nicotine  (NICODERM CQ - dosed in mg/24 hours) patch 21 mg  21 mg Transdermal Once Arnaldo NatalPaul F Malinda, MD   21 mg at 11/29/15 2009   No current outpatient prescriptions on file.     Discharge Medications: Please see discharge summary for a list of discharge medications.  Relevant Imaging Results:  Relevant Lab Results:   Additional Information  SSN [id number]  Maxwell SorrowCoble, [name], LCSW

## 2015-11-30 NOTE — ED Notes (Addendum)
Pt anxious about being here and current housing situation - allowed pt to verbalize concerns and reassured that SW was seeking placement - pt requested to be given "anxiety pill" again at bedtime - discussed with Dr Scotty CourtStafford and new order placed for Ativan every 6 hours prn

## 2015-11-30 NOTE — ED Notes (Signed)
Pt given cup of water and stated that he felt like he might need to have a BM later - reassured pt that we would assist him to BR if he would just let us know

## 2015-11-30 NOTE — ED Notes (Signed)
Pt requested update on his status - informed of situation and fact that SW were working on placement

## 2015-11-30 NOTE — ED Notes (Signed)
Pt found to not have a current FL2 on file at the hospital - called Irving ShowsScott Hunsaker cell and left message for him to contact Dahlia ClientHannah the hospital SW and obatin new FL2 - he was given Dahlia ClientHannah number [phone]

## 2015-11-30 NOTE — ED Notes (Signed)
Received call from pt social worker Teaching laboratory techniciancott Hunsaker - he stated that the group home pt came from would not accept him back - he ask if the MD here would consider changed FL2 to read "assisted living" instead of "group home" - Dr Sharma CovertNorman agreed to [facility]in Picket- [name] personal fax number is [phone]

## 2015-11-30 NOTE — ED Notes (Signed)
[facility] the BR [facility] - pt had BM

## 2015-11-30 NOTE — ED Notes (Signed)
PT  VOL  PENDING  PLACEMENT  PT  SEEN  BY  DR CLAPACS   ON 11/29/15

## 2015-11-30 NOTE — Progress Notes (Signed)
LCSW has called and left message for group home in reference to psych clearance and to pick patient up.  [facility].  Deretha EmoryHannah Charmane Protzman LCSW, MSW Clinical Social Work: Optician, dispensingystem Wide Float Coverage for :  [phone]

## 2015-12-01 MED ORDER — TUBERCULIN PPD 5 UNIT/0.1ML ID SOLN
5.0000 [IU] | Freq: Once | INTRADERMAL | Status: DC
  Administered 2015-12-01: 5 [IU] via INTRADERMAL
  Filled 2015-12-01: qty 0.1

## 2015-12-01 MED ORDER — GABAPENTIN 300 MG PO CAPS
300.0000 mg | ORAL_CAPSULE | Freq: Once | ORAL | Status: AC
  Administered 2015-12-01: 300 mg via ORAL
  Filled 2015-12-01: qty 1

## 2015-12-01 NOTE — Progress Notes (Signed)
Per APS worker [name] [facility] is sending a staff member to meet with patient today and evaluate to see if patient is a good fit for [facility]. RN is aware of above.   [facility] Giavanna Kang, LCSW [phone]

## 2015-12-01 NOTE — ED Notes (Signed)
[facility]

## 2015-12-01 NOTE — ED Notes (Signed)
Pt awake, pt given breakfast tray, pt in no acute distress

## 2015-12-01 NOTE — ED Notes (Signed)
Pt given wheelchair by [facility]

## 2015-12-01 NOTE — ED Notes (Signed)
Pt resting in bed, [facility]ess [facility] at bedside for evaluation

## 2015-12-01 NOTE — ED Notes (Signed)
Recliner placed in room, pt sitting in recliner, pt updated on [facility], awaiting to hear from social work, pt awake and alert in no acute distress

## 2015-12-01 NOTE — ED Notes (Signed)
[facility] given [facility]

## 2015-12-01 NOTE — Care Management Note (Signed)
Case Management Note  Patient Details  Name: Maxwell Webb MRN: [id number] Date of Birth: 01/10/1951  Subjective/Objective:    Called [facility] to see if the patient motorized w/c and prostheses  .They do not have either.              Action/Plan:   Expected Discharge Date:                  Expected Discharge Plan:     In-House Referral:     Discharge planning Services     Post Acute Care Choice:    Choice offered to:     DME Arranged:    DME Agency:     HH Arranged:    HH Agency:     Status of Service:     If discussed at [employer] Length of Stay Meetings, dates discussed:    Additional Comments:  [name], RN 12/01/2015, 1:01 PM

## 2015-12-01 NOTE — Progress Notes (Addendum)
Clinical Social Worker (CSW) received a call from Princeton[place] APS worker [phone] on yesterday 11/30/15 requesting CSW send FL2 to Maxwell Webb to review for group home admission. [name]. Per [name] APS is pursing guardianship and he is working on placement.   [name] called CSW today requested for FL2 to be sent to him and [name]. CSW faxed FL2 to [name] and APS.   [employer]Bailey Judith Demps, LCSW [phone]

## 2015-12-01 NOTE — Care Management Note (Signed)
Case Management Note  Patient Details  Name: Cynda AcresRobert Such MRN: [id number] Date of Birth: [birth year]  Subjective/Objective:     I have reached out to the [facility] to see if they can help with the purchase of a w/c for this man. I have also called 8-9 different places to price and locate a w/c. The closest outlet is the [facility] store. They have one in stock for $300 + tax. The next closest was [employer], stilll around $300, but we would have to have them pick it up on their way out of the hospital. I have made CSW [facility] aware of the challenge, and await word on dispo. Of the patient.               Action/Plan:   Expected Discharge Date:                  Expected Discharge Plan:     In-House Referral:     Discharge planning Services     Post Acute Care Choice:    Choice offered to:     DME Arranged:    DME Agency:     HH Arranged:    HH Agency:     Status of Service:     If discussed at [employer]Long Length of Stay Meetings, dates discussed:    Additional Comments:  Berna BueCheryl Ellianne Gowen, RN 12/01/2015, 12:55 PM

## 2015-12-01 NOTE — ED Notes (Signed)
Pt ate 100% of lunch

## 2015-12-01 NOTE — ED Notes (Signed)
PPD test administered, Right arm, area marked and dated

## 2015-12-01 NOTE — ED Notes (Signed)
Pt ate 100% of breakfast tray.

## 2015-12-01 NOTE — ED Notes (Signed)
Pt. Woke up and wanted to know what was going on.  Talked to and reassured patient. Gave pt. Some apple juice and snack.

## 2015-12-01 NOTE — ED Provider Notes (Signed)
Patient has been accepted to a nursing home, is medically stable for discharge.   Emily FilbertJonathan E Williams, MD 12/01/15 [phone]

## 2015-12-01 NOTE — ED Provider Notes (Signed)
-----------------------------------------   7:12 AM on 12/01/2015 -----------------------------------------   Blood pressure (!) 131/95, pulse 78, temperature 98 F (36.7 C), temperature source Oral, resp. rate 18, height 6\' 8"  (2.032 m), weight 210 lb (95.3 kg), SpO2 98 %.  The patient had no acute events since last update.  Calm and cooperative at this time.  Disposition is pending Psychiatry/Behavioral Medicine team recommendations.     Myrna Blazeravid Matthew Kalven Ganim, MD 12/01/15 [phone]

## 2015-12-01 NOTE — ED Notes (Signed)
Pt resting in bed, TV channel changed for pt, pt awake and alert in no acute distress

## 2015-12-01 NOTE — Progress Notes (Signed)
Per [name] APS worker [facility] will accept patient today and pick him up. APS worker asked if patient can get a wheel chair. RN case manager arranged for patient to have a wheel chair. Patient has an expired PASARR, Dustin [facility] administrator is aware of that and requested for CSW to just start PASARR and place PPD test. CSW started PASARR and RN placed PPD. Per Amalia Haileyustin [facility] will pick patient up at 2:30 pm today. Patient and RN aware of above. Please reconsult if future social work needs arise. CSW signing off.   Baker [employer] Inc[employer]Bailey Alitza Cowman, LCSW [phone]

## 2015-12-16 ENCOUNTER — Emergency Department
Admission: EM | Admit: 2015-12-16 | Discharge: 2015-12-16 | Disposition: A | Discharge status: Home / Self Care | Payer: Medicaid Other | Attending: Emergency Medicine | Admitting: Emergency Medicine

## 2015-12-16 DIAGNOSIS — Z5181 Encounter for therapeutic drug level monitoring: Secondary | ICD-10-CM | POA: Insufficient documentation

## 2015-12-16 DIAGNOSIS — F17200 Nicotine dependence, unspecified, uncomplicated: Secondary | ICD-10-CM | POA: Insufficient documentation

## 2015-12-16 DIAGNOSIS — F4325 Adjustment disorder with mixed disturbance of emotions and conduct: Secondary | ICD-10-CM

## 2015-12-16 DIAGNOSIS — F329 Major depressive disorder, single episode, unspecified: Secondary | ICD-10-CM | POA: Insufficient documentation

## 2015-12-16 DIAGNOSIS — F32A Depression, unspecified: Secondary | ICD-10-CM

## 2015-12-16 DIAGNOSIS — F04 Amnestic disorder due to known physiological condition: Secondary | ICD-10-CM

## 2015-12-16 DIAGNOSIS — E512 Wernicke's encephalopathy: Secondary | ICD-10-CM | POA: Diagnosis present

## 2015-12-16 DIAGNOSIS — I10 Essential (primary) hypertension: Secondary | ICD-10-CM | POA: Insufficient documentation

## 2015-12-16 LAB — COMPREHENSIVE METABOLIC PANEL
ALBUMIN: 4.1 g/dL (ref 3.5–5.0)
ALT: 19 U/L (ref 17–63)
AST: 21 U/L (ref 15–41)
Alkaline Phosphatase: 70 U/L (ref 38–126)
Anion gap: 8 (ref 5–15)
BILIRUBIN TOTAL: 0.5 mg/dL (ref 0.3–1.2)
BUN: 22 mg/dL — AB (ref 6–20)
CHLORIDE: 106 mmol/L (ref 101–111)
CO2: 26 mmol/L (ref 22–32)
Calcium: 9.7 mg/dL (ref 8.9–10.3)
Creatinine, Ser: 0.91 mg/dL (ref 0.61–1.24)
GFR calc Af Amer: >60 mL/min (ref >60)
GFR calc non Af Amer: >60 mL/min (ref >60)
GLUCOSE: 88 mg/dL (ref 65–99)
POTASSIUM: 4.4 mmol/L (ref 3.5–5.1)
Sodium: 140 mmol/L (ref 135–145)
Total Protein: 8 g/dL (ref 6.5–8.1)

## 2015-12-16 LAB — SALICYLATE LEVEL: Salicylate Lvl: <7 mg/dL (ref 2.8–30.0)

## 2015-12-16 LAB — CBC
HEMATOCRIT: 42.2 % (ref 40.0–52.0)
HEMOGLOBIN: 14.5 g/dL (ref 13.0–18.0)
MCH: 34.7 pg — ABNORMAL HIGH (ref 26.0–34.0)
MCHC: 34.5 g/dL (ref 32.0–36.0)
MCV: 100.7 fL — AB (ref 80.0–100.0)
Platelets: 284 10*3/uL (ref 150–440)
RBC: 4.19 MIL/uL — AB (ref 4.40–5.90)
RDW: 13.9 % (ref 11.5–14.5)
WBC: 9.8 10*3/uL (ref 3.8–10.6)

## 2015-12-16 LAB — URINE DRUG SCREEN, QUALITATIVE (ARMC ONLY)
AMPHETAMINES, UR SCREEN: NOT DETECTED
Barbiturates, Ur Screen: NOT DETECTED
Benzodiazepine, Ur Scrn: NOT DETECTED
Cannabinoid 50 Ng, Ur ~~LOC~~: NOT DETECTED
Cocaine Metabolite,Ur ~~LOC~~: NOT DETECTED
MDMA (ECSTASY) UR SCREEN: NOT DETECTED
Methadone Scn, Ur: NOT DETECTED
OPIATE, UR SCREEN: NOT DETECTED
PHENCYCLIDINE (PCP) UR S: NOT DETECTED
Tricyclic, Ur Screen: NOT DETECTED

## 2015-12-16 LAB — ETHANOL: Alcohol, Ethyl (B): <5 mg/dL (ref <5)

## 2015-12-16 LAB — ACETAMINOPHEN LEVEL: Acetaminophen (Tylenol), Serum: <10 ug/mL — ABNORMAL LOW (ref 10–30)

## 2015-12-16 NOTE — Discharge Instructions (Signed)
Please seek medical attention and help for any thoughts about wanting to harm herself, harm others, any concerning change in behavior, severe depression, inappropriate drug use or any other new or concerning symptoms. ° °

## 2015-12-16 NOTE — Consult Note (Signed)
[place] Psychiatry Consult   Reason for Consult:  Consult for 65 year old man with a history of dementia brought in from the [place] because of supposed suicidal statements Referring Physician:  Archie Balboa Patient Identification: Maxwell Webb MRN:  [id number] Principal Diagnosis: Korsakoff syndrome Diagnosis:   Patient Active Problem List   Diagnosis Date Noted  . Adjustment disorder with mixed disturbance of emotions and conduct [F43.25] 12/16/2015  . 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 male patient admitted with "I really have no idea". The patient was brought here with a report that he had made suicidal statements at his group home. The patient has consistently stated that he cannot remember the events. When I talked to him just now he said that he can remember being in an ambulance coming over here but doesn't really remember anything before that. He says that he accepts that it's possible he may have made suicidal statements but that he is sure it was just out of frustration. Patient absolutely denies any suicidal or homicidal ideation. He is not having any hallucinations. He feels like his mood is chronically frustrated. Doesn't have any other specific symptoms. Has not been drinking or abusing drugs.  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 Otherwise no specific ongoing active medical problems  Social history: Patient recently was moved from his old group home into the [place] where it was thought she could probably get better treatment. I think he has some family around but they're not very actively involved with him.  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  HPI:  See full note  above  Past Psychiatric History: Patient has a history of being seen here in our emergency room a few weeks ago. At that time he had really acted out in an agitated and potentially dangerous manner. This time it sounds like he was just running his mouth. Patient does not appear to be at high risk of self injury or aggression to others. He appears to be at his baseline with chronic dementia  Risk to Self:   Risk to Others:   Prior Inpatient Therapy:   Prior Outpatient Therapy:    Past Medical History:  Past Medical History:  Diagnosis Date  . Hypertension     Past Surgical History:  Procedure Laterality Date  . BELOW KNEE LEG AMPUTATION Bilateral    Family History: No family history on file. Family Psychiatric  History: No known family history Social History:  History  Alcohol Use  . Yes     History  Drug Use  . Types: Marijuana    Social History   Social History  . Marital status: Single    Spouse name: N/A  . Number of children: N/A  . Years of education: N/A   Social History Main Topics  . Smoking status: Current Every Day Smoker  . Smokeless tobacco: Never Used  . Alcohol use Yes  . Drug use:     Types: Marijuana  . Sexual activity: Not Asked   Other Topics Concern  . None   Social History Narrative  . None   Additional Social History:    Allergies:   Allergies  Allergen Reactions  . Other  Labs:  Results for orders placed or performed during the hospital encounter of 12/16/15 (from the past 48 hour(s))  Comprehensive metabolic panel     Status: Abnormal   Collection Time: 12/16/15  4:16 PM  Result Value Ref Range   Sodium 140 135 - 145 mmol/L   Potassium 4.4 3.5 - 5.1 mmol/L   Chloride 106 101 - 111 mmol/L   CO2 26 22 - 32 mmol/L   Glucose, Bld 88 65 - 99 mg/dL   BUN 22 (H) 6 - 20 mg/dL   Creatinine, Ser 0.91 0.61 - 1.24 mg/dL   Calcium 9.7 8.9 - 10.3 mg/dL   Total Protein 8.0 6.5 - 8.1 g/dL   Albumin 4.1 3.5 - 5.0 g/dL   AST 21 15 -  41 U/L   ALT 19 17 - 63 U/L   Alkaline Phosphatase 70 38 - 126 U/L   Total Bilirubin 0.5 0.3 - 1.2 mg/dL   GFR calc non Af Amer >60 >60 mL/min   GFR calc Af Amer >60 >60 mL/min    Comment: (NOTE) The eGFR has been calculated using the CKD EPI equation. This calculation has not been validated in all clinical situations. eGFR's persistently <60 mL/min signify possible Chronic Kidney Disease.    Anion gap 8 5 - 15  Ethanol     Status: None   Collection Time: 12/16/15  4:16 PM  Result Value Ref Range   Alcohol, Ethyl (B) <5 <5 mg/dL    Comment:        LOWEST DETECTABLE LIMIT FOR SERUM ALCOHOL IS 5 mg/dL FOR MEDICAL PURPOSES ONLY   Salicylate level     Status: None   Collection Time: 12/16/15  4:16 PM  Result Value Ref Range   Salicylate Lvl <1.0 2.8 - 30.0 mg/dL  Acetaminophen level     Status: Abnormal   Collection Time: 12/16/15  4:16 PM  Result Value Ref Range   Acetaminophen (Tylenol), Serum <10 (L) 10 - 30 ug/mL    Comment:        THERAPEUTIC CONCENTRATIONS VARY SIGNIFICANTLY. A RANGE OF 10-30 ug/mL MAY BE AN EFFECTIVE CONCENTRATION FOR MANY PATIENTS. HOWEVER, SOME ARE BEST TREATED AT CONCENTRATIONS OUTSIDE THIS RANGE. ACETAMINOPHEN CONCENTRATIONS >150 ug/mL AT 4 HOURS AFTER INGESTION AND >50 ug/mL AT 12 HOURS AFTER INGESTION ARE OFTEN ASSOCIATED WITH TOXIC REACTIONS.   cbc     Status: Abnormal   Collection Time: 12/16/15  4:16 PM  Result Value Ref Range   WBC 9.8 3.8 - 10.6 K/uL   RBC 4.19 (L) 4.40 - 5.90 MIL/uL   Hemoglobin 14.5 13.0 - 18.0 g/dL   HCT 42.2 40.0 - 52.0 %   MCV 100.7 (H) 80.0 - 100.0 fL   MCH 34.7 (H) 26.0 - 34.0 pg   MCHC 34.5 32.0 - 36.0 g/dL   RDW 13.9 11.5 - 14.5 %   Platelets 284 150 - 440 K/uL    No current facility-administered medications for this encounter.    No current outpatient prescriptions on file.    Musculoskeletal: Strength & Muscle Tone: within normal limits Gait & Station: unable to stand Patient leans:  Backward  Psychiatric Specialty Exam: Physical Exam  Constitutional: He appears well-developed and well-nourished.  HENT:  Head: Normocephalic and atraumatic.  Eyes: Conjunctivae are normal. Pupils are equal, round, and reactive to light.  Neck: Normal range of motion.  Cardiovascular: Normal heart sounds.   Respiratory: Effort normal.  GI: Soft.  Musculoskeletal: Normal range of motion.  Legs: Neurological: He is alert.  Skin: Skin is warm and dry.  Psychiatric: His affect is blunt. His speech is delayed. He is slowed. He expresses impulsivity. He expresses no homicidal and no suicidal ideation. He exhibits abnormal recent memory and abnormal remote memory.    Review of Systems  Constitutional: Negative.   HENT: Negative.   Eyes: Negative.   Respiratory: Negative.   Cardiovascular: Negative.   Gastrointestinal: Negative.   Musculoskeletal: Negative.   Skin: Negative.   Neurological: Negative.   Psychiatric/Behavioral: Positive for memory loss. Negative for depression, hallucinations, substance abuse and suicidal ideas. The patient is nervous/anxious. The patient does not have insomnia.     Blood pressure (!) 136/94, pulse (!) 18, temperature 98.5 F (36.9 C), temperature source Oral, resp. rate 18, weight 95.3 kg (210 lb), SpO2 100 %.Body mass index is 23.07 kg/m.  General Appearance: Casual  Eye Contact:  Fair  Speech:  Normal Rate  Volume:  Decreased  Mood:  Euthymic  Affect:  Constricted  Thought Process:  Disorganized  Orientation:  Negative  Thought Content:  Illogical and Rumination  Suicidal Thoughts:  No  Homicidal Thoughts:  No  Memory:  Immediate;   Good Recent;   Poor Remote;   Poor  Judgement:  Impaired  Insight:  Shallow  Psychomotor Activity:  Decreased  Concentration:  Concentration: Poor  Recall:  Poor  Fund of Knowledge:  Fair  Language:  Fair  Akathisia:  No  Handed:  Right  AIMS (if indicated):     Assets:  Housing  ADL's:  Impaired   Cognition:  Impaired,  Mild and Moderate  Sleep:        Treatment Plan Summary: Plan This is a 65 year old man with chronic dementia. History of acting out impulsively. On current exam there is no sign of acute or treatable mood symptoms or psychosis. He is calm and cooperative. No indication of any dangerousness to himself or others no indication that he would benefit from hospitalization. Case reviewed with emergency room physician. Patient can be discharged back to the [facility] to follow-up with his usual outpatient activity  Disposition: Patient does not meet criteria for psychiatric inpatient admission.  Alethia Berthold, MD 12/16/2015 6:21 PM

## 2015-12-16 NOTE — ED Notes (Signed)
Called for EMS transport [phone] 

## 2015-12-16 NOTE — ED Notes (Signed)
Upon assessment, patient states, "I don't want to hurt myself. Life is too beautiful. I just told them that because I don't want to be there." Patient denies HI/SI at this time. Patient is appears mildly confused and reports he cannot remember where he was when EMS picked him up. Patient states he is hungry and thirsty but denies any other needs at this time. Will continue to monitor.

## 2015-12-16 NOTE — ED Triage Notes (Signed)
Pt arrives to ER via [facility] from [facility]. States that he must have told a caregiver there that he wanted to kil himself due to the fact that he does not want to live at the [facility]. Pt does not recall a particular incident when he made such comments but states" I wouldn't put it past me". Pt denies any plan or attempts past. Pt poor historian.

## 2015-12-16 NOTE — ED Notes (Signed)
Pt asking for disposition info, told same info relayed 20 mins ago, pt had no recall and became agitated at this revelation and asked for a business card; pt given apology for any inappropriate behavior and ginger ale

## 2015-12-16 NOTE — ED Provider Notes (Signed)
[facility] Emergency Department Provider Note   ____________________________________________   I have reviewed the triage vital signs and the nursing notes.   HISTORY  Chief Complaint Suicidal   History limited by: Poor historian   HPI Maxwell Webb is a 65 y.o. male with history of depression, who presents to the emergency department today because of concern for suicidal ideation. He comes from the [facility]. He does state that he has been on Prozac for a number of years. He denies currently having a psychiatrist or therapist. He is a somewhat poor historian and orders simply not very forthcoming when asked about what led him to express SI to staff today. He does state however that he typically does not feel that way. He denies any plans. Denies feeling this way currently. Would not answer if there is any emotional event that made him mention SI to the staff. He denies any recent medical complaints. Denies any chest pain, fevers, nausea vomiting.   Past Medical History:  Diagnosis Date  . Hypertension     Patient Active Problem List   Diagnosis Date Noted  . Wernicke's encephalopathy 11/29/2015    Past Surgical History:  Procedure Laterality Date  . BELOW KNEE LEG AMPUTATION Bilateral     Prior to Admission medications   Not on File    Allergies Other  No family history on file.  Social History Social History  Substance Use Topics  . Smoking status: Current Every Day Smoker  . Smokeless tobacco: Never Used  . Alcohol use Yes    Review of Systems  Constitutional: Negative for fever. Cardiovascular: Negative for chest pain. Respiratory: Negative for shortness of breath. Gastrointestinal: Negative for abdominal pain, vomiting and diarrhea. Genitourinary: Negative for dysuria. Neurological: Negative for headaches, focal weakness or numbness. Psychiatric:Denies current SI   10-point ROS otherwise  negative.  ____________________________________________   PHYSICAL EXAM:  VITAL SIGNS: ED Triage Vitals  Enc Vitals Group     BP 12/16/15 1613 (!) 136/94     Pulse Rate 12/16/15 1613 (!) 18     Resp 12/16/15 1613 18     Temp 12/16/15 1613 98.5 F (36.9 C)     Temp Source 12/16/15 1613 Oral     SpO2 12/16/15 1613 100 %     Weight 12/16/15 1614 210 lb (95.3 kg)     Height --      Head Circumference --      Peak Flow --      Pain Score 12/16/15 1614 0   Constitutional: Alert and oriented. Flat affect, depressed. Eyes: Conjunctivae are normal. Normal extraocular movements. ENT   Head: Normocephalic and atraumatic.   Nose: No congestion/rhinnorhea.   Mouth/Throat: Mucous membranes are moist.   Neck: No stridor. Hematological/Lymphatic/Immunilogical: No cervical lymphadenopathy. Cardiovascular: Normal rate, regular rhythm.  No murmurs, rubs, or gallops.  Respiratory: Normal respiratory effort without tachypnea nor retractions. Breath sounds are clear and equal bilaterally. No wheezes/rales/rhonchi. Gastrointestinal: Soft and nontender. No distention.  Genitourinary: Deferred Musculoskeletal: Normal range of motion in all extremities. No lower extremity edema. Neurologic:  Normal speech and language. No gross focal neurologic deficits are appreciated.  Skin:  Skin is warm, dry and intact. No rash noted. Psychiatric: Denies SI. Appears slightly flat and depressed.  ____________________________________________    LABS (pertinent positives/negatives)  Labs Reviewed  COMPREHENSIVE METABOLIC PANEL - Abnormal; Notable for the following:       Result Value   BUN 22 (*)    All other  components within normal limits  ACETAMINOPHEN LEVEL - Abnormal; Notable for the following:    Acetaminophen (Tylenol), Serum <10 (*)    All other components within normal limits  CBC - Abnormal; Notable for the following:    RBC 4.19 (*)    MCV 100.7 (*)    MCH 34.7 (*)    All other  components within normal limits  ETHANOL  SALICYLATE LEVEL  URINE DRUG SCREEN, QUALITATIVE ([facility] ONLY)     ____________________________________________   EKG  None  ____________________________________________    RADIOLOGY  None  ____________________________________________   PROCEDURES  Procedures  ____________________________________________   INITIAL IMPRESSION / ASSESSMENT AND PLAN / ED COURSE  Pertinent labs & imaging results that were available during my care of the patient were reviewed by me and considered in my medical decision making (see chart for details).  Patient to the emergency department apparently after expressing some thoughts of depression and SI to nursing home staff. The time my examination he denies any SI. Will plan on having patient be evaluated by psychiatry.  Clinical Course    Patient seen by psychiatry. Feel he is safe for discharge back to living facility. ____________________________________________   FINAL CLINICAL IMPRESSION(S) / ED DIAGNOSES  Final diagnoses:  Depression, unspecified depression type     Note: This dictation was prepared with Dragon dictation. Any transcriptional errors that result from this process are unintentional    Phineas SemenGraydon Javione Gunawan, MD 12/16/15 [phone]

## 2015-12-16 NOTE — ED Notes (Signed)
Called for EMS transport [phone]

## 2015-12-16 NOTE — ED Notes (Signed)
Called the [facility] to give reports. Instructed by Sheralyn Boatmanoni to give report to EMS and they can pass it on.

## 2015-12-31 ENCOUNTER — Emergency Department
Admission: EM | Admit: 2015-12-31 | Discharge: 2015-12-31 | Disposition: A | Discharge status: Home / Self Care | Payer: BC Managed Care – PPO | Attending: Emergency Medicine | Admitting: Emergency Medicine

## 2015-12-31 ENCOUNTER — Encounter: Payer: Self-pay | Admitting: *Deleted

## 2015-12-31 DIAGNOSIS — M79605 Pain in left leg: Secondary | ICD-10-CM | POA: Diagnosis present

## 2015-12-31 DIAGNOSIS — F17210 Nicotine dependence, cigarettes, uncomplicated: Secondary | ICD-10-CM | POA: Diagnosis not present

## 2015-12-31 DIAGNOSIS — G546 Phantom limb syndrome with pain: Secondary | ICD-10-CM | POA: Insufficient documentation

## 2015-12-31 DIAGNOSIS — I10 Essential (primary) hypertension: Secondary | ICD-10-CM | POA: Insufficient documentation

## 2015-12-31 NOTE — ED Notes (Signed)
RN called report the [facility]. Pt taken back by EMS at this time.

## 2015-12-31 NOTE — ED Triage Notes (Signed)
PT arrived to ED via EMS from [facility] for phantom leg pain. PT is bilateral leg pain. Pt in NAD upon arrival.

## 2015-12-31 NOTE — ED Notes (Signed)
RN called pts Emergency contact, Daughter Melody who informed RN that he is in [facility]. Pt is to be taken back per EMS. Family reports they are not supposed to get pt.

## 2015-12-31 NOTE — ED Provider Notes (Signed)
[facility] Emergency Department Provider Note  ____________________________________________   First MD Initiated Contact with Patient 12/31/15 2027     (approximate)  I have reviewed the triage vital signs and the nursing notes.   HISTORY  Chief Complaint Leg Pain   HPI Maxwell Webb is a 65 y.o. male he previous history of or in acute  And lower extremity bilateral below the amputation.  Patient reports he's had this type of discomfort off-and-on for years. He describes a discomfort that is located below the left knee, and it is below the area of his amputation in his "phantom limb"  No fevers chills or nausea or vomiting. Reports he's had the same symptoms off and on many times, now being treated with gabapentin which he just took before coming  Past Medical History:  Diagnosis Date  . Hypertension     Patient Active Problem List   Diagnosis Date Noted  . 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 BELOW KNEE LEG AMPUTATION Bilateral     Prior to Admission medications   Not on File    Allergies Other  History reviewed. No pertinent family history.  Social History Social History  Substance Use Topics  . Smoking status: Current Every Day Smoker  . Smokeless tobacco: Never Used  . Alcohol use Yes    Review of Systems Constitutional: No fever/chills Eyes: No visual changes. ENT: No sore throat. Cardiovascular: Denies chest pain. Respiratory: Denies shortness of breath. Gastrointestinal: No abdominal pain.  No nausea, no vomiting.   Genitourinary: Negative for dysuria. Musculoskeletal: Negative for back pain. Skin: Negative for rash. Neurological: Negative for headaches, focal weakness or numbness.  10-point ROS otherwise negative.  ____________________________________________   PHYSICAL EXAM:  VITAL  SIGNS: ED Triage Vitals  Enc Vitals Group     BP 12/31/15 2024 (!) 148/90     Pulse Rate 12/31/15 2024 (!) 108     Resp 12/31/15 2024 16     Temp 12/31/15 2024 98.4 F (36.9 C)     Temp Source 12/31/15 2024 Oral     SpO2 12/31/15 2024 97 %     Weight 12/31/15 2024 210 lb (95.3 kg)     Height 12/31/15 2024 6\' 8"  (2.032 m)     Head Circumference --      Peak Flow --      Pain Score 12/31/15 2026 6     Pain Loc --      Pain Edu? --      Excl. in GC? --     Constitutional: Alert and oriented. Well appearing and in no acute distress. Eyes: Conjunctivae are normal. PERRL. EOMI. Head: Atraumatic. Nose: No congestion/rhinnorhea. Mouth/Throat: Mucous membranes are moist.  Oropharynx non-erythematous. Neck: No stridor.   Cardiovascular: Normal rate, regular rhythm. Grossly normal heart sounds.  Good peripheral circulation. Respiratory: Normal respiratory effort.  No retractions. Lungs CTAB. Gastrointestinal: Soft and nontender. No distention.  Musculoskeletal: No lower extremity tenderness nor edema.Bilateral below the knee amputation sites are extremely clean, appear chronic and intact. No skin changes or edema. Incisions very clean, no evidence of infection. Patient reports that the leg and thighs themselves are not tender, he reports the pain and discomfort he feels off-and-on is in the phantom regions of his limb. Neurologic:  Normal speech and language. No gross focal neurologic deficits are appreciated.Skin:  Skin is warm, dry and  intact. No rash noted. Psychiatric: Mood and affect are normal. Speech and behavior are normal.  ____________________________________________   LABS (all labs ordered are listed, but only abnormal results are displayed)  Labs Reviewed - No data to display ____________________________________________  EKG   ____________________________________________  RADIOLOGY   ____________________________________________   PROCEDURES  Procedure(s)  performed: None  Procedures  Critical Care performed: No  ____________________________________________   INITIAL IMPRESSION / ASSESSMENT AND PLAN / ED COURSE  Pertinent labs & imaging results that were available during my care of the patient were reviewed by me and considered in my medical decision making (see chart for details).  Patient appears stable. Reports intermittent and chronic pain in his phantom limb. Extremely reassuring exam, no evidence of acute abnormality. Discussed with the patient, and he reports his pain and symptoms are much better after having taking gabapentin before arrival. Discussed return precautions, recommended neurology follow-up, the patient is agreeable.  Clinical Course      ____________________________________________   FINAL CLINICAL IMPRESSION(S) / ED DIAGNOSES  Final diagnoses:  Phantom limb syndrome with pain (HCC)      NEW MEDICATIONS STARTED DURING THIS VISIT:  There are no discharge medications for this patient.    Note:  This document was prepared using Dragon voice recognition software and may include unintentional dictation errors.     Sharyn CreamerMark Willette Mudry, MD 01/01/16 [phone]

## 2015-12-31 NOTE — Discharge Instructions (Signed)
Please return to the emergency room if you develop severe pain, a fever, redness or color changes in the leg or swelling, or other new concerns arise.  I recommended follow-up with your regular doctor, as well as set up a routine follow-up visit with neurology.

## 2016-01-04 ENCOUNTER — Encounter: Payer: Self-pay | Admitting: Emergency Medicine

## 2016-01-04 ENCOUNTER — Observation Stay (HOSPITAL_BASED_OUTPATIENT_CLINIC_OR_DEPARTMENT_OTHER)
Admit: 2016-01-04 | Discharge: 2016-01-04 | Disposition: A | Discharge status: Home / Self Care | Payer: BC Managed Care – PPO | Attending: Internal Medicine | Admitting: Internal Medicine

## 2016-01-04 ENCOUNTER — Observation Stay
Admission: EM | Admit: 2016-01-04 | Discharge: 2016-01-05 | Disposition: A | Discharge status: Home / Self Care | Payer: BC Managed Care – PPO | Attending: Internal Medicine | Admitting: Internal Medicine

## 2016-01-04 ENCOUNTER — Emergency Department: Payer: BC Managed Care – PPO

## 2016-01-04 ENCOUNTER — Observation Stay: Payer: BC Managed Care – PPO

## 2016-01-04 DIAGNOSIS — F04 Amnestic disorder due to known physiological condition: Secondary | ICD-10-CM | POA: Insufficient documentation

## 2016-01-04 DIAGNOSIS — R200 Anesthesia of skin: Secondary | ICD-10-CM | POA: Diagnosis not present

## 2016-01-04 DIAGNOSIS — G459 Transient cerebral ischemic attack, unspecified: Secondary | ICD-10-CM | POA: Diagnosis not present

## 2016-01-04 DIAGNOSIS — F17200 Nicotine dependence, unspecified, uncomplicated: Secondary | ICD-10-CM | POA: Insufficient documentation

## 2016-01-04 DIAGNOSIS — Z89512 Acquired absence of left leg below knee: Secondary | ICD-10-CM | POA: Insufficient documentation

## 2016-01-04 DIAGNOSIS — F1010 Alcohol abuse, uncomplicated: Secondary | ICD-10-CM | POA: Insufficient documentation

## 2016-01-04 DIAGNOSIS — I639 Cerebral infarction, unspecified: Secondary | ICD-10-CM | POA: Diagnosis present

## 2016-01-04 DIAGNOSIS — E1151 Type 2 diabetes mellitus with diabetic peripheral angiopathy without gangrene: Secondary | ICD-10-CM | POA: Insufficient documentation

## 2016-01-04 DIAGNOSIS — Z8249 Family history of ischemic heart disease and other diseases of the circulatory system: Secondary | ICD-10-CM | POA: Diagnosis not present

## 2016-01-04 DIAGNOSIS — F4325 Adjustment disorder with mixed disturbance of emotions and conduct: Secondary | ICD-10-CM | POA: Insufficient documentation

## 2016-01-04 DIAGNOSIS — G458 Other transient cerebral ischemic attacks and related syndromes: Secondary | ICD-10-CM

## 2016-01-04 DIAGNOSIS — Z833 Family history of diabetes mellitus: Secondary | ICD-10-CM | POA: Insufficient documentation

## 2016-01-04 DIAGNOSIS — Z7982 Long term (current) use of aspirin: Secondary | ICD-10-CM | POA: Diagnosis not present

## 2016-01-04 DIAGNOSIS — Z89511 Acquired absence of right leg below knee: Secondary | ICD-10-CM | POA: Diagnosis not present

## 2016-01-04 DIAGNOSIS — M6281 Muscle weakness (generalized): Secondary | ICD-10-CM

## 2016-01-04 DIAGNOSIS — I6502 Occlusion and stenosis of left vertebral artery: Secondary | ICD-10-CM | POA: Insufficient documentation

## 2016-01-04 DIAGNOSIS — I10 Essential (primary) hypertension: Secondary | ICD-10-CM | POA: Diagnosis not present

## 2016-01-04 LAB — GLUCOSE, CAPILLARY
GLUCOSE-CAPILLARY: 121 mg/dL — AB (ref 65–99)
GLUCOSE-CAPILLARY: 180 mg/dL — AB (ref 65–99)
Glucose-Capillary: 157 mg/dL — ABNORMAL HIGH (ref 65–99)

## 2016-01-04 LAB — TROPONIN I: Troponin I: <0.03 ng/mL (ref <0.03)

## 2016-01-04 LAB — DIFFERENTIAL
BASOS PCT: 1 %
Basophils Absolute: 0.1 10*3/uL (ref 0–0.1)
EOS ABS: 0.5 10*3/uL (ref 0–0.7)
EOS PCT: 6 %
LYMPHS PCT: 26 %
Lymphs Abs: 2.1 10*3/uL (ref 1.0–3.6)
MONO ABS: 0.9 10*3/uL (ref 0.2–1.0)
Monocytes Relative: 11 %
NEUTROS PCT: 56 %
Neutro Abs: 4.6 10*3/uL (ref 1.4–6.5)

## 2016-01-04 LAB — COMPREHENSIVE METABOLIC PANEL
ALBUMIN: 4.1 g/dL (ref 3.5–5.0)
ALK PHOS: 66 U/L (ref 38–126)
ALT: 20 U/L (ref 17–63)
ANION GAP: 8 (ref 5–15)
AST: 30 U/L (ref 15–41)
BUN: 21 mg/dL — ABNORMAL HIGH (ref 6–20)
CALCIUM: 9.8 mg/dL (ref 8.9–10.3)
CHLORIDE: 107 mmol/L (ref 101–111)
CO2: 25 mmol/L (ref 22–32)
Creatinine, Ser: 0.91 mg/dL (ref 0.61–1.24)
GFR calc non Af Amer: >60 mL/min (ref >60)
GLUCOSE: 129 mg/dL — AB (ref 65–99)
POTASSIUM: 4.3 mmol/L (ref 3.5–5.1)
SODIUM: 140 mmol/L (ref 135–145)
Total Bilirubin: 0.7 mg/dL (ref 0.3–1.2)
Total Protein: 7.8 g/dL (ref 6.5–8.1)

## 2016-01-04 LAB — CBC
HCT: 48.1 % (ref 40.0–52.0)
Hemoglobin: 16 g/dL (ref 13.0–18.0)
MCH: 33 pg (ref 26.0–34.0)
MCHC: 33.2 g/dL (ref 32.0–36.0)
MCV: 99.4 fL (ref 80.0–100.0)
PLATELETS: 305 10*3/uL (ref 150–440)
RBC: 4.83 MIL/uL (ref 4.40–5.90)
RDW: 13.3 % (ref 11.5–14.5)
WBC: 8.2 10*3/uL (ref 3.8–10.6)

## 2016-01-04 LAB — PROTIME-INR
INR: 0.92
PROTHROMBIN TIME: 12.3 s (ref 11.4–15.2)

## 2016-01-04 LAB — APTT: aPTT: 32 seconds (ref 24–36)

## 2016-01-04 MED ORDER — ASPIRIN EC 81 MG PO TBEC
81.0000 mg | DELAYED_RELEASE_TABLET | Freq: Every day | ORAL | Status: DC
  Administered 2016-01-04 – 2016-01-05 (×2): 81 mg via ORAL
  Filled 2016-01-04 (×2): qty 1

## 2016-01-04 MED ORDER — INSULIN ASPART 100 UNIT/ML ~~LOC~~ SOLN
0.0000 [IU] | Freq: Three times a day (TID) | SUBCUTANEOUS | Status: DC
  Administered 2016-01-04 – 2016-01-05 (×2): 2 [IU] via SUBCUTANEOUS
  Filled 2016-01-04 (×2): qty 2

## 2016-01-04 MED ORDER — CITALOPRAM HYDROBROMIDE 20 MG PO TABS
20.0000 mg | ORAL_TABLET | Freq: Every day | ORAL | Status: DC
  Administered 2016-01-05: 11:00:00 20 mg via ORAL
  Filled 2016-01-04: qty 1

## 2016-01-04 MED ORDER — ACETAMINOPHEN 325 MG PO TABS
650.0000 mg | ORAL_TABLET | ORAL | Status: DC | PRN
  Filled 2016-01-04: qty 2

## 2016-01-04 MED ORDER — ACETAMINOPHEN 650 MG RE SUPP: 650.0000 mg | RECTAL | Status: DC | PRN

## 2016-01-04 MED ORDER — GABAPENTIN 100 MG PO CAPS
100.0000 mg | ORAL_CAPSULE | Freq: Two times a day (BID) | ORAL | Status: DC
  Administered 2016-01-04 – 2016-01-05 (×2): 100 mg via ORAL
  Filled 2016-01-04 (×2): qty 1

## 2016-01-04 MED ORDER — STROKE: EARLY STAGES OF RECOVERY BOOK
Freq: Once | Status: AC
  Administered 2016-01-04: 15:00:00

## 2016-01-04 MED ORDER — ACETAMINOPHEN 160 MG/5ML PO SOLN: 650.0000 mg | ORAL | Status: DC | PRN

## 2016-01-04 MED ORDER — IBUPROFEN 400 MG PO TABS
400.0000 mg | ORAL_TABLET | Freq: Three times a day (TID) | ORAL | Status: DC | PRN
  Administered 2016-01-04 – 2016-01-05 (×2): 400 mg via ORAL
  Filled 2016-01-04 (×2): qty 1

## 2016-01-04 MED ORDER — ENOXAPARIN SODIUM 40 MG/0.4ML ~~LOC~~ SOLN
40.0000 mg | SUBCUTANEOUS | Status: DC
  Administered 2016-01-04: 40 mg via SUBCUTANEOUS
  Filled 2016-01-04: qty 0.4

## 2016-01-04 MED ORDER — NICOTINE 21 MG/24HR TD PT24
21.0000 mg | MEDICATED_PATCH | Freq: Every day | TRANSDERMAL | Status: DC
  Administered 2016-01-04 – 2016-01-05 (×2): 21 mg via TRANSDERMAL
  Filled 2016-01-04 (×2): qty 1

## 2016-01-04 MED ORDER — ALPRAZOLAM 0.25 MG PO TABS
0.2500 mg | ORAL_TABLET | Freq: Once | ORAL | Status: AC
  Administered 2016-01-04: 0.25 mg via ORAL
  Filled 2016-01-04: qty 1

## 2016-01-04 NOTE — ED Notes (Signed)
Pt asking for food multiple times. Informed once admitting MD sees him and okays him to eat will get him food. NAD. Urinal at bedside.

## 2016-01-04 NOTE — ED Provider Notes (Signed)
[facility] Emergency Department Provider Note   ____________________________________________   First MD Initiated Contact with Patient 01/04/16 1129     (approximate)  I have reviewed the triage vital signs and the nursing notes.   HISTORY  Chief Complaint Numbness    HPI Maxwell Webb is a 65 y.o. male who reports he woke up with a headache in the occipital area of his head and left-sided numbness of his face, arm and leg. The numbness is improved somewhat this point where now in the emergency room is only got numbness in the left hand and leg. He's never had anything like this before.   Past Medical History:  Diagnosis Date  . Hypertension     Patient Active Problem List   Diagnosis Date Noted  . 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 BELOW KNEE LEG AMPUTATION Bilateral     Prior to Admission medications   Medication Sig Start Date End Date Taking? Authorizing Provider  citalopram (CELEXA) 20 MG tablet Take 20 mg by mouth daily.   Yes Historical Provider, MD  gabapentin (NEURONTIN) 100 MG capsule Take 100 mg by mouth 2 (two) times daily.   Yes Historical Provider, MD    Allergies Patient has no known allergies.  History reviewed. No pertinent family history.  Social History Social History  Substance Use Topics  . Smoking status: Current Every Day Smoker  . Smokeless tobacco: Never Used  . Alcohol use Yes    Review of Systems Constitutional: No fever/chills Eyes: No visual changes. ENT: No sore throat. Cardiovascular: Denies chest pain. Respiratory: Denies shortness of breath. Gastrointestinal: No abdominal pain.  No nausea, no vomiting.  No diarrhea.  No constipation. Genitourinary: Negative for dysuria. Musculoskeletal: Negative for back pain. Skin: Negative for rash. Neurological: Negative for  headaches, focal weakness   10-point ROS otherwise negative.  ____________________________________________   PHYSICAL EXAM:  VITAL SIGNS: ED Triage Vitals  Enc Vitals Group     BP 01/04/16 1118 117/67     Pulse Rate 01/04/16 1118 95     Resp 01/04/16 1118 14     Temp 01/04/16 1118 98.8 F (37.1 C)     Temp Source 01/04/16 1118 Oral     SpO2 01/04/16 1118 96 %     Weight 01/04/16 1118 215 lb (97.5 kg)     Height 01/04/16 1118 6\' 8"  (2.032 m)     Head Circumference --      Peak Flow --      Pain Score 01/04/16 1119 6     Pain Loc --      Pain Edu? --      Excl. in GC? --     Constitutional: Alert and oriented. Well appearing and in no acute distress. Eyes: Conjunctivae are normal. PERRL. EOMI. Head: Atraumatic. Nose: No congestion/rhinnorhea. Mouth/Throat: Mucous membranes are moist.  Oropharynx non-erythematous. Neck: No stridor. Cardiovascular: Normal rate, regular rhythm. Grossly normal heart sounds.  Good peripheral circulation. Respiratory: Normal respiratory effort.  No retractions. Lungs CTAB. Gastrointestinal: Soft and nontender. No distention. No abdominal bruits. No CVA tenderness. Musculoskeletal:Patient has bilateral BKA's Neurologic:  Normal speech and language. Cranial nerves II through XII are intact cerebellar finger-nose is normal motor strength is 5 over 5 throughout in all intact areas of the body patient is numb in his left leg and left hand now. His facial numbness and  left arm numbness have resolved. Skin:  Skin is warm, dry and intact. No rash noted. Psychiatric: Mood and affect are normal. Speech and behavior are normal.  ____________________________________________   LABS (all labs ordered are listed, but only abnormal results are displayed)  Labs Reviewed  GLUCOSE, CAPILLARY - Abnormal; Notable for the following:       Result Value   Glucose-Capillary 121 (*)    All other components within normal limits  PROTIME-INR  APTT  CBC    DIFFERENTIAL  COMPREHENSIVE METABOLIC PANEL  TROPONIN I  CBG MONITORING, ED   ____________________________________________  EKG  E read and interpreted by me shows normal sinus rhythm rate of 95 there is some PR segment depression inferiorly otherwise besides the right bundle-branch block the EKG shows no acute changes there are no old EKGs to compare with ____________________________________________  RADIOLOGY  Study Result   CLINICAL DATA:  Code stroke.  Headache.  Left-sided numbness.  EXAM: CT HEAD WITHOUT CONTRAST  TECHNIQUE: Contiguous axial images were obtained from the base of the skull through the vertex without intravenous contrast.  COMPARISON:  CT head 11/05/2015  FINDINGS: Brain: Generalized atrophy. Chronic microvascular ischemic changes in the white matter. Chronic ischemia in the right corona radiata unchanged.  Negative for acute infarct.  Negative for hemorrhage or mass.  Vascular: No hyperdense vessel or unexpected calcification.  Skull: Negative  Sinuses/Orbits: Negative  Other: None  ASPECTS (Alberta Stroke Program Early CT Score)  - Ganglionic level infarction (caudate, lentiform nuclei, internal capsule, insula, M1-M3 cortex): 7  - Supraganglionic infarction (M4-M6 cortex): 3  Total score (0-10 with 10 being normal): 10  IMPRESSION: 1. Negative for acute abnormality. Atrophy and chronic microvascular ischemia. 2. ASPECTS is 10  These results were called by telephone at the time of interpretation on 01/04/2016 at 11:48 am to Dr. Dorothea GlassmanPAUL Kenneith Stief , who verbally acknowledged these results.   Electronically Signed   By: Marlan Palauharles  Clark M.D.   On: 01/04/2016 11:49    ____________________________________________   PROCEDURES  Procedure(s) performed:   Procedures  Critical Care performed:   ____________________________________________   INITIAL IMPRESSION / ASSESSMENT AND PLAN / ED COURSE  Pertinent labs &  imaging results that were available during my care of the patient were reviewed by me and considered in my medical decision making (see chart for details).    Clinical Course      ____________________________________________   FINAL CLINICAL IMPRESSION(S) / ED DIAGNOSES  Final diagnoses:  Cerebrovascular accident (CVA), unspecified mechanism (HCC)      NEW MEDICATIONS STARTED DURING THIS VISIT:  New Prescriptions   No medications on file     Note:  This document was prepared using Dragon voice recognition software and may include unintentional dictation errors.    Arnaldo NatalPaul F Ledford Goodson, MD 01/04/16 2027

## 2016-01-04 NOTE — ED Notes (Signed)
Meal tray ordered 

## 2016-01-04 NOTE — Consult Note (Signed)
Referring Physician: Darnelle CatalanMalinda    Chief Complaint: Headache, left sided numbness  HPI: Maxwell Webb is an 65 y.o. male who reports awakening this morning and noting a bitemporal headache and left sided numbness.  Symptoms did not improve and therefore patient presented for evaluation.  Patient reports current headache is now occipital, sharp and at a severity of 6/10.  Numbness has improved somewhat to only involve his left hand.  Initial NIHSS of 2.    Date last known well: Date: 01/03/2016 Time last known well: Time: 23:00 tPA Given: No: Outside time window  Past Medical History:  Diagnosis Date  . Hypertension     Past Surgical History:  Procedure Laterality Date  . BELOW KNEE LEG AMPUTATION Bilateral     Family history: CAD, DM, HTN  Social History:  reports that he has been smoking.  He has never used smokeless tobacco. He reports that he drinks alcohol. He reports that he uses drugs, including Marijuana.  Allergies: No Known Allergies  Medications: I have reviewed the patient's current medications. Prior to Admission:  Prior to Admission medications   Medication Sig Start Date End Date Taking? Authorizing Provider  citalopram (CELEXA) 20 MG tablet Take 20 mg by mouth daily.   Yes Historical Provider, MD  gabapentin (NEURONTIN) 100 MG capsule Take 100 mg by mouth 2 (two) times daily.   Yes Historical Provider, MD    ROS: History obtained from the patient  General ROS: negative for - chills, fatigue, fever, night sweats, weight gain or weight loss Psychological ROS: negative for - behavioral disorder, hallucinations, memory difficulties, mood swings or suicidal ideation Ophthalmic ROS: negative for - blurry vision, double vision, eye pain or loss of vision ENT ROS: negative for - epistaxis, nasal discharge, oral lesions, sore throat, tinnitus or vertigo Allergy and Immunology ROS: negative for - hives or itchy/watery eyes Hematological and Lymphatic ROS: negative for -  bleeding problems, bruising or swollen lymph nodes Endocrine ROS: negative for - galactorrhea, hair pattern changes, polydipsia/polyuria or temperature intolerance Respiratory ROS: negative for - cough, hemoptysis, shortness of breath or wheezing Cardiovascular ROS: negative for - chest pain, dyspnea on exertion, edema or irregular heartbeat Gastrointestinal ROS: negative for - abdominal pain, diarrhea, hematemesis, nausea/vomiting or stool incontinence Genito-Urinary ROS: negative for - dysuria, hematuria, incontinence or urinary frequency/urgency Musculoskeletal ROS: negative for - joint swelling or muscular weakness Neurological ROS: as noted in HPI Dermatological ROS: negative for rash and skin lesion changes  Physical Examination: Blood pressure (!) 134/91, pulse 85, temperature 98.8 F (37.1 C), resp. rate 19, height 6\' 8"  (2.032 m), weight 97.5 kg (215 lb), SpO2 97 %.  HEENT-  Normocephalic, no lesions, without obvious abnormality.  Normal external eye and conjunctiva.  Normal TM's bilaterally.  Normal auditory canals and external ears. Normal external nose, mucus membranes and septum.  Normal pharynx. Cardiovascular- S1, S2 normal, pulses palpable throughout   Lungs- chest clear, no wheezing, rales, normal symmetric air entry Abdomen- soft, non-tender; bowel sounds normal; no masses,  no organomegaly Extremities- bilateral BKA's Lymph-no adenopathy palpable Musculoskeletal-no joint tenderness, deformity or swelling Skin-warm and dry, no hyperpigmentation, vitiligo, or suspicious lesions  Neurological Examination Mental Status: Alert, oriented, thought content appropriate.  Speech fluent without evidence of aphasia.  Able to follow 3 step commands without difficulty. Cranial Nerves: II: Discs flat bilaterally; Visual fields grossly normal, pupils equal, round, reactive to light and accommodation III,IV, VI: ptosis not present, extra-ocular motions intact bilaterally V,VII: smile  symmetric, facial light touch sensation  normal bilaterally VIII: hearing normal bilaterally IX,X: gag reflex present XI: bilateral shoulder shrug XII: midline tongue extension Motor: Right : Upper extremity   5/5    Left:     Upper extremity   5/5  Lower extremity   5/5     Lower extremity   5/5 Tone and bulk:normal tone throughout; no atrophy noted Sensory: Pinprick and light touch decreased in the left hand Deep Tendon Reflexes: 1+ in the upper extremities and absent at the knees bilaterally Plantars: Bilateral BKA's  Cerebellar: Normal finger-to-nose testing bilaterally Gait: not tested due to absent prosthesis    Laboratory Studies:  Basic Metabolic Panel:  Recent Labs Lab 01/04/16 1125  NA 140  K 4.3  CL 107  CO2 25  GLUCOSE 129*  BUN 21*  CREATININE 0.91  CALCIUM 9.8    Liver Function Tests:  Recent Labs Lab 01/04/16 1125  AST 30  ALT 20  ALKPHOS 66  BILITOT 0.7  PROT 7.8  ALBUMIN 4.1   No results for input(s): LIPASE, AMYLASE in the last 168 hours. No results for input(s): AMMONIA in the last 168 hours.  CBC:  Recent Labs Lab 01/04/16 1125  WBC 8.2  NEUTROABS 4.6  HGB 16.0  HCT 48.1  MCV 99.4  PLT 305    Cardiac Enzymes:  Recent Labs Lab 01/04/16 1125  TROPONINI <0.03    BNP: Invalid input(s): POCBNP  CBG:  Recent Labs Lab 01/04/16 1128  GLUCAP 121*    Microbiology: No results found for this or any previous visit.  Coagulation Studies:  Recent Labs  01/04/16 1125  LABPROT 12.3  INR 0.92    Urinalysis: No results for input(s): COLORURINE, LABSPEC, PHURINE, GLUCOSEU, HGBUR, BILIRUBINUR, KETONESUR, PROTEINUR, UROBILINOGEN, NITRITE, LEUKOCYTESUR in the last 168 hours.  Invalid input(s): APPERANCEUR  Lipid Panel: No results found for: CHOL, TRIG, HDL, CHOLHDL, VLDL, LDLCALC  HgbA1C: No results found for: HGBA1C  Urine Drug Screen:      Component Value Date/Time   LABOPIA NONE DETECTED 12/16/2015 1615    COCAINSCRNUR NONE DETECTED 12/16/2015 1615   LABBENZ NONE DETECTED 12/16/2015 1615   AMPHETMU NONE DETECTED 12/16/2015 1615   THCU NONE DETECTED 12/16/2015 1615   LABBARB NONE DETECTED 12/16/2015 1615    Alcohol Level: No results for input(s): ETH in the last 168 hours.   Imaging: Ct Head Code Stroke W/o Cm  Result Date: 01/04/2016 CLINICAL DATA:  Code stroke.  Headache.  Left-sided numbness. EXAM: CT HEAD WITHOUT CONTRAST TECHNIQUE: Contiguous axial images were obtained from the base of the skull through the vertex without intravenous contrast. COMPARISON:  CT head 11/05/2015 FINDINGS: Brain: Generalized atrophy. Chronic microvascular ischemic changes in the white matter. Chronic ischemia in the right corona radiata unchanged. Negative for acute infarct.  Negative for hemorrhage or mass. Vascular: No hyperdense vessel or unexpected calcification. Skull: Negative Sinuses/Orbits: Negative Other: None ASPECTS (Alberta Stroke Program Early CT Score) - Ganglionic level infarction (caudate, lentiform nuclei, internal capsule, insula, M1-M3 cortex): 7 - Supraganglionic infarction (M4-M6 cortex): 3 Total score (0-10 with 10 being normal): 10 IMPRESSION: 1. Negative for acute abnormality. Atrophy and chronic microvascular ischemia. 2. ASPECTS is 10 These results were called by telephone at the time of interpretation on 01/04/2016 at 11:48 am to Dr. Dorothea Glassman , who verbally acknowledged these results. Electronically Signed   By: Marlan Palau M.D.   On: 01/04/2016 11:49    Assessment: 65 y.o. male presenting with headache and left sided numbness.  Patient outside window for  tPA.  Head CT reviewed and shows no acute changes.  Can not rule out acute infarct based on presentation.  Further work up recommended.  Patient on no antiplatelet therapy.    Stroke Risk Factors - hypertension and smoking  Plan: 1. HgbA1c, fasting lipid panel 2. MRI, MRA  of the brain without contrast 3. PT consult, OT consult,  Speech consult 4. Echocardiogram 5. Carotid dopplers 6. Prophylactic therapy-Antiplatelet med: Aspirin - dose 325mg  daily 7. NPO until RN stroke swallow screen 8. Telemetry monitoring 9. Frequent neuro checks 10. Smoking cessation counseling    Thana FarrLeslie Koreena Joost, MD Neurology [phone] 01/04/2016, 1:32 PM

## 2016-01-04 NOTE — H&P (Signed)
[facility] [place] [facility]   PATIENT NAME: Maxwell AcresRobert Webb    MR#:  [id number]  DATE OF BIRTH:  07/15/1950  DATE OF ADMISSION:  01/04/2016  PRIMARY CARE PHYSICIAN: No PCP Per Patient   REQUESTING/REFERRING PHYSICIAN: Dr Darnelle Catalanmalinda  CHIEF COMPLAINT:  Left sided numbness  HISTORY OF PRESENT ILLNESS:  Maxwell Webb  is a 65 y.o. male with a known history of DM-2 not on any meds, tobacco abuse,Peripheral arterial disease status post bilateral lower extremity amputation, ongoing alcohol abuse comes to the emergency room with left-sided numbness. Patient said he was at his usual state of well-being last night woke up feeling numb in his left upper and lower extremity. He denies any focal weakness dysphagia and aphasia or blurred vision. Patient was seen by neurology recommends further workup for possible CVA. The patient reports drinking alcohol about a couple months ago. He lives in a family care home.  PAST MEDICAL HISTORY:   Past Medical History:  Diagnosis Date  . Hypertension     PAST SURGICAL HISTOIRY:   Past Surgical History:  Procedure Laterality Date  . BELOW KNEE LEG AMPUTATION Bilateral     SOCIAL HISTORY:   Social History  Substance Use Topics  . Smoking status: Current Every Day Smoker  . Smokeless tobacco: Never Used  . Alcohol use Yes    FAMILY HISTORY:  History reviewed. No pertinent family history.  DRUG ALLERGIES:  No Known Allergies  REVIEW OF SYSTEMS:  Review of Systems  Constitutional: Negative for chills, fever and weight loss.  HENT: Negative for ear discharge, ear pain and nosebleeds.   Eyes: Negative for blurred vision, pain and discharge.  Respiratory: Negative for sputum production, shortness of breath, wheezing and stridor.   Cardiovascular: Negative for chest pain, palpitations, orthopnea and PND.  Gastrointestinal: Negative for abdominal pain, diarrhea, nausea and vomiting.  Genitourinary: Negative for  frequency and urgency.  Musculoskeletal: Negative for back pain and joint pain.  Neurological: Positive for tingling, sensory change and weakness. Negative for speech change and focal weakness.  Psychiatric/Behavioral: Negative for depression and hallucinations. The patient is not nervous/anxious.      MEDICATIONS AT HOME:   Prior to Admission medications   Medication Sig Start Date End Date Taking? Authorizing Provider  citalopram (CELEXA) 20 MG tablet Take 20 mg by mouth daily.   Yes Historical Provider, MD  gabapentin (NEURONTIN) 100 MG capsule Take 100 mg by mouth 2 (two) times daily.   Yes Historical Provider, MD      VITAL SIGNS:  Blood pressure (!) 134/91, pulse 85, temperature 98.8 F (37.1 C), resp. rate 19, height 6\' 8"  (2.032 m), weight 97.5 kg (215 lb), SpO2 97 %.  PHYSICAL EXAMINATION:  GENERAL:  65 y.o.-year-old patient lying in the bed with no acute distress.  EYES: Pupils equal, round, reactive to light and accommodation. No scleral icterus. Extraocular muscles intact.  HEENT: Head atraumatic, normocephalic. Oropharynx and nasopharynx clear.  NECK:  Supple, no jugular venous distention. No thyroid enlargement, no tenderness.  LUNGS: Normal breath sounds bilaterally, no wheezing, rales,rhonchi or crepitation. No use of accessory muscles of respiration.  CARDIOVASCULAR: S1, S2 normal. No murmurs, rubs, or gallops.  ABDOMEN: Soft, nontender, nondistended. Bowel sounds present. No organomegaly or mass.  EXTREMITIES: bilateral lower extremity BKA NEUROLOGIC: Cranial nerves II through XII are intact. Muscle strength 5/5 in all extremities. Sensation intact. Gait not checked.  PSYCHIATRIC: The patient is alert and oriented x 3.  SKIN: No obvious rash,  lesion, or ulcer.   LABORATORY PANEL:   CBC  Recent Labs Lab 01/04/16 1125  WBC 8.2  HGB 16.0  HCT 48.1  PLT 305    ------------------------------------------------------------------------------------------------------------------  Chemistries   Recent Labs Lab 01/04/16 1125  NA 140  K 4.3  CL 107  CO2 25  GLUCOSE 129*  BUN 21*  CREATININE 0.91  CALCIUM 9.8  AST 30  ALT 20  ALKPHOS 66  BILITOT 0.7   ------------------------------------------------------------------------------------------------------------------  Cardiac Enzymes  Recent Labs Lab 01/04/16 1125  TROPONINI <0.03   ------------------------------------------------------------------------------------------------------------------  RADIOLOGY:  Ct Head Code Stroke W/o Cm  Result Date: 01/04/2016 CLINICAL DATA:  Code stroke.  Headache.  Left-sided numbness. EXAM: CT HEAD WITHOUT CONTRAST TECHNIQUE: Contiguous axial images were obtained from the base of the skull through the vertex without intravenous contrast. COMPARISON:  CT head 11/05/2015 FINDINGS: Brain: Generalized atrophy. Chronic microvascular ischemic changes in the white matter. Chronic ischemia in the right corona radiata unchanged. Negative for acute infarct.  Negative for hemorrhage or mass. Vascular: No hyperdense vessel or unexpected calcification. Skull: Negative Sinuses/Orbits: Negative Other: None ASPECTS (Alberta Stroke Program Early CT Score) - Ganglionic level infarction (caudate, lentiform nuclei, internal capsule, insula, M1-M3 cortex): 7 - Supraganglionic infarction (M4-M6 cortex): 3 Total score (0-10 with 10 being normal): 10 IMPRESSION: 1. Negative for acute abnormality. Atrophy and chronic microvascular ischemia. 2. ASPECTS is 10 These results were called by telephone at the time of interpretation on 01/04/2016 at 11:48 am to Dr. Dorothea GlassmanPAUL MALINDA , who verbally acknowledged these results. Electronically Signed   By: Marlan Palauharles  Clark M.D.   On: 01/04/2016 11:49    EKG:   NSR, RBBB IMPRESSION AND PLAN:   Maxwell Webb  is a 65 y.o. male with a known history of  DM-2 not on any meds, tobacco abuse,Peripheral arterial disease status post bilateral lower extremity amputation, ongoing alcohol abuse comes to the emergency room with left-sided numbness. Patient said he was at his usual state of well-being last night woke up feeling numb in his left upper and lower extremity  1. Left sided numbness suspect TIA CT head neg for acute CVA, old stroke Admit to medical floor Neurology consult Continue aspirin MRI MRA ultrasound carotid doppler and echo of the heart   2.ongoing Tobacco abuse Advised smoking 4 mins spend  3. Type 2 DM -ssi Patient not on any medications at home  4. History of alcohol abuse. -Patient reports he drinks only that he has access to it. He lives in a family care home. He reports he has not had it for a few months. Not sure about the reliability of that information. We'll continue monitor for signs of withdrawal.  5. DVT prophylaxis subcutaneous Lovenox.  All the records are reviewed and case discussed with ED provider. Management plans discussed with the patient, family and they are in agreement.  CODE STATUS: FULL TOTAL TIME TAKING CARE OF THIS PATIENT: 45 minutes.    Fritzi Scripter M.D on 01/04/2016 at 1:25 PM  Between 7am to 6pm - Pager - [phone]  After 6pm go to [email or web address] - password [email or web address] [facility]  [facility]  Office  [phone]  CC: Primary care physician; No PCP Per Patient

## 2016-01-04 NOTE — Plan of Care (Signed)
Problem: Education: Goal: Knowledge of [place] [facility] Education information/materials will improve Outcome: Progressing Pt answers appropriately to questions, but is forgetful.     Problem: Physical Regulation: Goal: Ability to maintain clinical measurements within normal limits will improve Outcome: Progressing Pt admitted from the ED. No neuro changes. NIH 1. Denies pain. Pt c/o anxiety. MD notified, xanax ordered and given. Pt irritable prior to shift change. Pt refuses tele and to be swabbed for MRSA PCR. Dr Allena KatzPatel is aware. Received an order to d/c tele.

## 2016-01-04 NOTE — ED Triage Notes (Signed)
Pt started at 0930 today with headache between temples and left sided numbness to face, forearm, and leg. Denies other sx. Speech WNL. Bilateral BKA

## 2016-01-04 NOTE — ED Notes (Signed)
Dr Thad Rangerreynolds at bedside while in CT

## 2016-01-04 NOTE — ED Notes (Signed)
Patient transported to CT with RN 

## 2016-01-05 ENCOUNTER — Observation Stay: Payer: BC Managed Care – PPO

## 2016-01-05 DIAGNOSIS — G458 Other transient cerebral ischemic attacks and related syndromes: Secondary | ICD-10-CM | POA: Diagnosis not present

## 2016-01-05 LAB — LIPID PANEL
Cholesterol: 178 mg/dL (ref 0–200)
HDL: 41 mg/dL (ref >40)
LDL CALC: 119 mg/dL — AB (ref 0–99)
TRIGLYCERIDES: 89 mg/dL (ref <150)
Total CHOL/HDL Ratio: 4.3 RATIO
VLDL: 18 mg/dL (ref 0–40)

## 2016-01-05 LAB — ECHOCARDIOGRAM COMPLETE
Height: 80 in
WEIGHTICAEL: 3209.6 [oz_av]

## 2016-01-05 LAB — GLUCOSE, CAPILLARY
GLUCOSE-CAPILLARY: 104 mg/dL — AB (ref 65–99)
GLUCOSE-CAPILLARY: 159 mg/dL — AB (ref 65–99)

## 2016-01-05 MED ORDER — ATORVASTATIN CALCIUM 40 MG PO TABS: 40.0000 mg | ORAL_TABLET | Freq: Every day | ORAL | 2 refills | Status: DC

## 2016-01-05 MED ORDER — ATORVASTATIN CALCIUM 20 MG PO TABS: 40.0000 mg | ORAL_TABLET | Freq: Every day | ORAL | Status: DC

## 2016-01-05 MED ORDER — ASPIRIN 81 MG PO TBEC: 81.0000 mg | DELAYED_RELEASE_TABLET | Freq: Every day | ORAL | 2 refills | Status: DC
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
## 2016-01-05 NOTE — Care Management (Signed)
Physical therapy evaluation completed. Recommending home with home health and physical therapy. Discussed home health agencies with Mr. Maxwell OxfordLink at the bedside. Chose [facility]. Will update Maxwell GottronJason Webb, [facility] representative. Discharge to home today per Dr. Reina Fusehen Araiya Tilmon Webb Maxwell Jacobson RN MSN CCM Care Management

## 2016-01-05 NOTE — Progress Notes (Signed)
Subjective: Patient reports although improved continues to have numbness in his left hand.    Objective: Current vital signs: BP 121/82 (BP Location: Right Arm)   Pulse 99   Temp 97.8 F (36.6 C) (Oral)   Resp 18   Ht 6\' 8"  (2.032 m) Comment: prior to leg ambulation  Wt 91 kg (200 lb 9.6 oz)   SpO2 96%   BMI 22.04 kg/m  Vital signs in last 24 hours: Temp:  [97.5 F (36.4 C)-98.8 F (37.1 C)] 97.8 F (36.6 C) (12/21 0938) Pulse Rate:  [73-109] 99 (12/21 0938) Resp:  [14-19] 18 (12/21 0938) BP: (99-141)/(67-97) 121/82 (12/21 0938) SpO2:  [91 %-99 %] 96 % (12/21 0938) Weight:  [91 kg (200 lb 9.6 oz)-97.5 kg (215 lb)] 91 kg (200 lb 9.6 oz) (12/20 1230)  Intake/Output from previous day: 12/20 0701 - 12/21 0700 In: 240 [P.O.:240] Out: 500 [Urine:500] Intake/Output this shift: Total I/O In: 240 [P.O.:240] Out: 200 [Urine:200] Nutritional status: Diet Heart Room service appropriate? Yes; Fluid consistency: Thin  Neurologic Exam: Alert, oriented, thought content appropriate.  Speech fluent without evidence of aphasia.  Able to follow 3 step commands without difficulty. Cranial Nerves: II: Discs flat bilaterally; Visual fields grossly normal, pupils equal, round, reactive to light and accommodation III,IV, VI: ptosis not present, extra-ocular motions intact bilaterally V,VII: smile symmetric, facial light touch sensation normal bilaterally VIII: hearing normal bilaterally IX,X: gag reflex present XI: bilateral shoulder shrug XII: midline tongue extension Motor: Right :  Upper extremity   5/5                                      Left:     Upper extremity   5/5             Lower extremity   5/5                                                  Lower extremity   5/5 Tone and bulk:normal tone throughout; no atrophy noted Sensory: Pinprick and light touch decreased in the left hand    Lab Results: Basic Metabolic Panel:  Recent Labs Lab 01/04/16 1125  NA 140  K 4.3  CL  107  CO2 25  GLUCOSE 129*  BUN 21*  CREATININE 0.91  CALCIUM 9.8    Liver Function Tests:  Recent Labs Lab 01/04/16 1125  AST 30  ALT 20  ALKPHOS 66  BILITOT 0.7  PROT 7.8  ALBUMIN 4.1   No results for input(s): LIPASE, AMYLASE in the last 168 hours. No results for input(s): AMMONIA in the last 168 hours.  CBC:  Recent Labs Lab 01/04/16 1125  WBC 8.2  NEUTROABS 4.6  HGB 16.0  HCT 48.1  MCV 99.4  PLT 305    Cardiac Enzymes:  Recent Labs Lab 01/04/16 1125  TROPONINI <0.03    Lipid Panel:  Recent Labs Lab 01/05/16 0456  CHOL 178  TRIG 89  HDL 41  CHOLHDL 4.3  VLDL 18  LDLCALC 161*    CBG:  Recent Labs Lab 01/04/16 1128 01/04/16 1707 01/04/16 2148 01/05/16 0731  GLUCAP 121* 180* 157* 104*    Microbiology: No results found for this or any previous visit.  Coagulation Studies:  Recent Labs  01/04/16 1125  LABPROT 12.3  INR 0.92    Imaging: [place] Carotid Bilateral (at [facility] Only)  Result Date: 01/04/2016 CLINICAL DATA:  65 year old male with a history of TIA. Cardiovascular risk factors include hypertension, diabetes, tobacco use EXAM: BILATERAL CAROTID DUPLEX ULTRASOUND TECHNIQUE: Wallace CullensGray scale imaging, color Doppler and duplex ultrasound were performed of bilateral carotid and vertebral arteries in the neck. COMPARISON:  No prior duplex FINDINGS: Criteria: Quantification of carotid stenosis is based on velocity parameters that correlate the residual internal carotid diameter with NASCET-based stenosis levels, using the diameter of the distal internal carotid lumen as the denominator for stenosis measurement. The following velocity measurements were obtained: RIGHT ICA:  Systolic 53 cm/sec, Diastolic 21 cm/sec CCA:  57 cm/sec SYSTOLIC ICA/CCA RATIO:  0.9 ECA:  119 cm/sec LEFT ICA:  Systolic 82 cm/sec, Diastolic 19 cm/sec CCA:  105 cm/sec SYSTOLIC ICA/CCA RATIO:  0.8 ECA:  207 cm/sec Right Brachial SBP: Not acquired Left Brachial SBP: Not  acquired RIGHT CAROTID ARTERY: No significant calcifications of the right common carotid artery. Intermediate waveform maintained. Heterogeneous and partially calcified plaque at the right carotid bifurcation. No significant lumen shadowing. Low resistance waveform of the right ICA. No significant tortuosity. RIGHT VERTEBRAL ARTERY: Antegrade flow with low resistance waveform. LEFT CAROTID ARTERY: No significant calcifications of the left common carotid artery. Intermediate waveform maintained. Heterogeneous and partially calcified plaque at the left carotid bifurcation without significant lumen shadowing. Low resistance waveform of the left ICA. No significant tortuosity. LEFT VERTEBRAL ARTERY:  Antegrade flow with low resistance waveform. IMPRESSION: Color duplex indicates moderate heterogeneous and calcified plaque, with no hemodynamically significant stenosis by duplex criteria in the extracranial cerebrovascular circulation. Signed, Yvone NeuJaime S. Loreta AveWagner, DO [facility] Electronically Signed   By: Gilmer MorJaime  Wagner D.O.   On: 01/04/2016 16:57   Ct Head Code Stroke W/o Cm  Result Date: 01/04/2016 CLINICAL DATA:  Code stroke.  Headache.  Left-sided numbness. EXAM: CT HEAD WITHOUT CONTRAST TECHNIQUE: Contiguous axial images were obtained from the base of the skull through the vertex without intravenous contrast. COMPARISON:  CT head 11/05/2015 FINDINGS: Brain: Generalized atrophy. Chronic microvascular ischemic changes in the white matter. Chronic ischemia in the right corona radiata unchanged. Negative for acute infarct.  Negative for hemorrhage or mass. Vascular: No hyperdense vessel or unexpected calcification. Skull: Negative Sinuses/Orbits: Negative Other: None ASPECTS (Alberta Stroke Program Early CT Score) - Ganglionic level infarction (caudate, lentiform nuclei, internal capsule, insula, M1-M3 cortex): 7 - Supraganglionic infarction (M4-M6 cortex): 3  Total score (0-10 with 10 being normal): 10 IMPRESSION: 1. Negative for acute abnormality. Atrophy and chronic microvascular ischemia. 2. ASPECTS is 10 These results were called by telephone at the time of interpretation on 01/04/2016 at 11:48 am to Dr. Dorothea GlassmanPAUL MALINDA , who verbally acknowledged these results. Electronically Signed   By: Marlan Palauharles  Clark M.D.   On: 01/04/2016 11:49    Medications:  I have reviewed the patient's current medications. Scheduled: . aspirin EC  81 mg Oral Daily  . citalopram  20 mg Oral Daily  . enoxaparin (LOVENOX) injection  40 mg Subcutaneous Q24H  . gabapentin  100 mg Oral BID  . insulin aspart  0-9 Units Subcutaneous TID WC  . nicotine  21 mg Transdermal Daily    Assessment/Plan: Patient stable.  No new neurological complaints.  MRI pending.  Carotid dopplers show no hemodynamically significant stenosis.  LDL 119.  A1c pending.    Recommendations: 1.  Continue ASA 2.  Statin for lipid management with target LDL<70.   LOS: 0 days   Thana FarrLeslie Mashal Slavick, MD Neurology [phone] 01/05/2016  10:31 AM

## 2016-01-05 NOTE — Evaluation (Addendum)
Speech Language Pathology Evaluation Patient Details Name: Maxwell Webb MRN: [id number] DOB: 1950/11/04 Today's Date: 01/05/2016 Time: 0930-1030 SLP Time Calculation (min) (ACUTE ONLY): 60 min  Problem List:  Patient Active Problem List   Diagnosis Date Noted  . TIA (transient ischemic attack) 01/04/2016  . 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 Hypertension    Past Surgical History:  Past Surgical History:  Procedure Laterality Date  . BELOW KNEE LEG AMPUTATION Bilateral    HPI:  PT is a 65 y.o. male with a known history of DM-2 not on any meds, tobacco abuse, alcohol use, Peripheral arterial disease status post bilateral lower extremity amputation, ongoing alcohol abuse comes to the emergency room with left-sided numbness. Patient said he was at his usual state of well-being last night woke up feeling numb in his left upper and lower extremity. He denies any focal weakness dysphagia and aphasia or blurred vision. Pt lives in a family group home.    Assessment / Plan / Recommendation Clinical Impression  Pt appears at a functional baseline for communicating his wants and needs w/ others in an appropriate way. Pt's responses in conversation were brief and short; unsure if this is his baseline communication presentation/skills. Pt was not too engaged w/ extensive assessment but did answer basic y/n questions and follow commands appropriately. Pt engaged in brief social interaction/greeting phrases appropriately. Pt did deny any difficulty w/ his speech and language/communication w/ others including the NSG this morning. He may be at his baseline for expressive and receptive communication. Pt denied any swallowing deficits while eating ice cream and crackers w/ sips of water intermittently(again, unsure of pt's baseline at the Group Home where he  resides). If when pt returns home and cognitive-linguistic deficits are noted, recommend f/u w/ ST services for a formal evaluation. NSG updated, agreed.     SLP Assessment  Patient does not need any further Speech Lanaguage Pathology Services; any deficits are noted upon return to home setting at discharge, then would recommend a formal assessment in an outpatient or home health setting to determine needs.    Follow Up Recommendations   (TBD if need arises) If need is identified upon discharge to home setting.   Frequency and Duration  n/a        SLP Evaluation Cognition  Overall Cognitive Status: Within Functional Limits for tasks assessed Arousal/Alertness: Awake/alert Orientation Level: Oriented X4 Attention: Focused Focused Attention: Appears intact Awareness: Appears intact Problem Solving: Appears intact Behaviors:  (reduced verbal output at times; brief) Comments: pt was not too engaged w/ extensive assessment       Comprehension  Auditory Comprehension Overall Auditory Comprehension: Appears within functional limits for tasks assessed Yes/No Questions: Within Functional Limits Commands: Within Functional Limits Conversation: Simple Other Conversation Comments: unsure of his baseline conversational level Reading Comprehension Reading Status: Not tested    Expression Expression Primary Mode of Expression: Verbal Verbal Expression Overall Verbal Expression: Impaired Initiation: No impairment Automatic Speech:  (wfl) Level of Generative/Spontaneous Verbalization: Sentence Naming: No impairment Pragmatics:  (min ) Other Verbal Expression Comments: pt's responses in conversation were brief and short; unsure of his baseline Written Expression Dominant Hand: Right Written Expression: Not tested   Oral / Motor  Oral Motor/Sensory Function Overall Oral Motor/Sensory Function: Within functional limits Motor Speech Overall Motor Speech: Appears within functional  limits for tasks assessed Respiration: Within functional  limits Phonation: Normal Resonance: Within functional limits Articulation: Within functional limitis Intelligibility: Intelligible Motor Planning: Witnin functional limits Motor Speech Errors: Not applicable   GO          Functional Assessment Tool Used: clinical judgement Functional Limitations: Spoken language expressive Spoken Language Comprehension Current Status [phone](G9159): 0 percent impaired, limited or restricted Spoken Language Comprehension Goal Status ([id number](G9160): 0 percent impaired, limited or restricted Spoken Language Comprehension Discharge Status [phone](G9161): 0 percent impaired, limited or restricted Spoken Language Expression Current Status [phone](G9162): 0 percent impaired, limited or restricted Spoken Language Expression Goal Status ([phone](G9163): 0 percent impaired, limited or restricted Spoken Language Expression Discharge Status [phone](G9164): 0 percent impaired, limited or restricted          Jerilynn SomKatherine Amita Atayde, MS, CCC-SLP Maymuna Detzel 01/05/2016, 11:24 AM

## 2016-01-05 NOTE — Plan of Care (Signed)
Problem: Education: Goal: Knowledge of [facility] Education information/materials will improve Outcome: Not Progressing Pt given stroke materials but not eager to learn. Pt refused cardiac monitor at change of shift last night, MD made aware. He also refused MRSA swab (he is from [facility]). Furthermore, pt refused to have IV restarted after he removed it. MD made aware. Dr Tobi BastosPyreddy stated " we can't force him". NIH=1

## 2016-01-05 NOTE — Progress Notes (Signed)
Pt is being discharged back to the [facility]. Discharge papers explained to pt. Pt verbalized understanding. Meds and f/u appointment reviewed with pt. Discharge papers and RX placed in pt's discharge packet and given to [place] [facility].

## 2016-01-05 NOTE — Clinical Social Work Note (Signed)
Patient to be d/c'ed today to [facility].  Patient agreeable to plans will transport via facility transportation.  Windell MouldingEric Francisca Langenderfer, MSW, LCSWA Mon-Fri 8a-4:30p [phone]

## 2016-01-05 NOTE — Discharge Summary (Signed)
[facility] [place] [facility]   PATIENT NAME: Maxwell Webb    MR#:  [id number]  DATE OF BIRTH:  Sep 02, 1950  DATE OF ADMISSION:  01/04/2016   ADMITTING PHYSICIAN: Enedina Finner, MD  DATE OF DISCHARGE: 01/05/2016  PRIMARY CARE PHYSICIAN: No PCP Per Patient   ADMISSION DIAGNOSIS:  Cerebrovascular accident (CVA), unspecified mechanism (HCC) [I63.9] DISCHARGE DIAGNOSIS:  Active Problems:   TIA (transient ischemic attack)  SECONDARY DIAGNOSIS:   Past Medical History:  Diagnosis Date  . Hypertension    HOSPITAL COURSE:  Maxwell Webb  is a 65 y.o. male with a known history of DM-2 not on any meds, tobacco abuse,Peripheral arterial disease status post bilateral lower extremity amputation, ongoing alcohol abuse comes to the emergency room with left-sided numbness. Patient said he was at his usual state of well-being last night woke up feeling numb in his left upper and lower extremity  1. Left sided numbness suspect TIA CT head and MRI brain neg for acute CVA, old stroke Neurology consult: ASA, statin. Continue aspirin, add lipitor. No stenosis per carotid doppler and EF: 55% -   60% oer echo of the heart   2.ongoing Tobacco abuse Advised smoking 4 mins spend  3. Type 2 DM -ssi Patient not on any medications at home  4. History of alcohol abuse. -Patient reports he drinks only that he has access to it. He lives in a family care home. He reports he has not had it for a few months. Not sure about the reliability of that information. No signs of withdrawal.  DISCHARGE CONDITIONS:  Stable, discharge to ALF with HHPT. CONSULTS OBTAINED:  Treatment Team:  Kym Groom, MD DRUG ALLERGIES:  No Known Allergies DISCHARGE MEDICATIONS:   Allergies as of 01/05/2016   No Known Allergies     Medication List    TAKE these medications   aspirin 81 MG EC tablet Take 1 tablet (81 mg total) by mouth daily. Start taking on:  01/06/2016   atorvastatin  40 MG tablet Commonly known as:  LIPITOR Take 1 tablet (40 mg total) by mouth daily at 6 PM.   citalopram 20 MG tablet Commonly known as:  CELEXA Take 20 mg by mouth daily.   gabapentin 100 MG capsule Commonly known as:  NEURONTIN Take 100 mg by mouth 2 (two) times daily.        DISCHARGE INSTRUCTIONS:  See AVS  If you experience worsening of your admission symptoms, develop shortness of breath, life threatening emergency, suicidal or homicidal thoughts you must seek medical attention immediately by calling 911 or calling your MD immediately  if symptoms less severe.  You Must read complete instructions/literature along with all the possible adverse reactions/side effects for all the Medicines you take and that have been prescribed to you. Take any new Medicines after you have completely understood and accpet all the possible adverse reactions/side effects.   Please note  You were cared for by a hospitalist during your hospital stay. If you have any questions about your discharge medications or the care you received while you were in the hospital after you are discharged, you can call the unit and asked to speak with the hospitalist on call if the hospitalist that took care of you is not available. Once you are discharged, your primary care physician will handle any further medical issues. Please note that NO REFILLS for any discharge medications will be authorized once you are discharged, as it is imperative that you return  to your primary care physician (or establish a relationship with a primary care physician if you do not have one) for your aftercare needs so that they can reassess your need for medications and monitor your lab values.    On the day of Discharge:  VITAL SIGNS:  Blood pressure 119/81, pulse 95, temperature 97.7 F (36.5 C), temperature source Oral, resp. rate 20, height 6\' 8"  (2.032 m), weight 200 lb 9.6 oz (91 kg), SpO2 100 %. PHYSICAL EXAMINATION:  GENERAL:   65 y.o.-year-old patient lying in the bed with no acute distress.  EYES: Pupils equal, round, reactive to light and accommodation. No scleral icterus. Extraocular muscles intact.  HEENT: Head atraumatic, normocephalic. Oropharynx and nasopharynx clear.  NECK:  Supple, no jugular venous distention. No thyroid enlargement, no tenderness.  LUNGS: Normal breath sounds bilaterally, no wheezing, rales,rhonchi or crepitation. No use of accessory muscles of respiration.  CARDIOVASCULAR: S1, S2 normal. No murmurs, rubs, or gallops.  ABDOMEN: Soft, non-tender, non-distended. Bowel sounds present. No organomegaly or mass.  EXTREMITIES: No pedal edema, cyanosis, or clubbing. B/l BKA. NEUROLOGIC: Cranial nerves II through XII are intact. Muscle strength 5/5 in all extremities. Sensation intact. Gait not checked.  PSYCHIATRIC: The patient is alert and oriented x 3.  SKIN: No obvious rash, lesion, or ulcer.  DATA REVIEW:   CBC  Recent Labs Lab 01/04/16 1125  WBC 8.2  HGB 16.0  HCT 48.1  PLT 305    Chemistries   Recent Labs Lab 01/04/16 1125  NA 140  K 4.3  CL 107  CO2 25  GLUCOSE 129*  BUN 21*  CREATININE 0.91  CALCIUM 9.8  AST 30  ALT 20  ALKPHOS 66  BILITOT 0.7     Microbiology Results  No results found for this or any previous visit.  RADIOLOGY:  Mr Brain 62Wo Contrast  Result Date: 01/05/2016 CLINICAL DATA:  65 year old diabetic male with history of smoking and ongoing alcohol abuse presenting with left upper and lower extremity numbness. Subsequent encounter. EXAM: MRI HEAD WITHOUT CONTRAST TECHNIQUE: Multiplanar, multiecho pulse sequences of the brain and surrounding structures were obtained without intravenous contrast. COMPARISON:  01/04/2016 and 11/05/2015 head CT. No comparison brain MR. FINDINGS: The present examination is limited to 3 sequences. Patient refused to continue exam. Remote infarct mid right centrum semiovale. Right paracentral pons region linear area of  altered signal intensity on diffusion imaging may represent artifact or result of prior infarct without accompanying ADC abnormality as may be expected if this represented an acute infarct. Overall, no acute infarct is noted. Chronic microvascular changes. No obvious intracranial hemorrhage or mass although evaluation limited. Mild atrophy without hydrocephalus. Limited evaluation of intracranial vasculature. IMPRESSION: The present examination is limited to 3 sequences. Patient refused to continue exam. No acute infarct detected.  Please see above. Electronically Signed   By: Lacy DuverneySteven  Olson M.D.   On: 01/05/2016 11:14   [place] Carotid Bilateral (at [facility] Only)  Result Date: 01/04/2016 CLINICAL DATA:  65 year old male with a history of TIA. Cardiovascular risk factors include hypertension, diabetes, tobacco use EXAM: BILATERAL CAROTID DUPLEX ULTRASOUND TECHNIQUE: Wallace CullensGray scale imaging, color Doppler and duplex ultrasound were performed of bilateral carotid and vertebral arteries in the neck. COMPARISON:  No prior duplex FINDINGS: Criteria: Quantification of carotid stenosis is based on velocity parameters that correlate the residual internal carotid diameter with NASCET-based stenosis levels, using the diameter of the distal internal carotid lumen as the denominator for stenosis measurement. The following velocity measurements were  obtained: RIGHT ICA:  Systolic 53 cm/sec, Diastolic 21 cm/sec CCA:  57 cm/sec SYSTOLIC ICA/CCA RATIO:  0.9 ECA:  119 cm/sec LEFT ICA:  Systolic 82 cm/sec, Diastolic 19 cm/sec CCA:  105 cm/sec SYSTOLIC ICA/CCA RATIO:  0.8 ECA:  207 cm/sec Right Brachial SBP: Not acquired Left Brachial SBP: Not acquired RIGHT CAROTID ARTERY: No significant calcifications of the right common carotid artery. Intermediate waveform maintained. Heterogeneous and partially calcified plaque at the right carotid bifurcation. No significant lumen shadowing. Low resistance waveform of the right ICA. No significant  tortuosity. RIGHT VERTEBRAL ARTERY: Antegrade flow with low resistance waveform. LEFT CAROTID ARTERY: No significant calcifications of the left common carotid artery. Intermediate waveform maintained. Heterogeneous and partially calcified plaque at the left carotid bifurcation without significant lumen shadowing. Low resistance waveform of the left ICA. No significant tortuosity. LEFT VERTEBRAL ARTERY:  Antegrade flow with low resistance waveform. IMPRESSION: Color duplex indicates moderate heterogeneous and calcified plaque, with no hemodynamically significant stenosis by duplex criteria in the extracranial cerebrovascular circulation. Signed, Yvone NeuJaime S. Loreta AveWagner, DO [facility] Electronically Signed   By: Gilmer MorJaime  Wagner D.O.   On: 01/04/2016 16:57     Management plans discussed with the patient, family and they are in agreement.  CODE STATUS:     Code Status Orders        Start     Ordered   01/04/16 1504  Full code  Continuous     01/04/16 1503    Code Status History    Date Active Date Inactive Code Status Order ID Comments User Context   This patient has a current code status but no historical code status.      TOTAL TIME TAKING CARE OF THIS PATIENT: 35 minutes.    Maxwell Webb, Maxwell Webb M.D on 01/05/2016 at 3:46 PM  Between 7am to 6pm - Pager - [phone]  After 6pm go to [email or web address] - Scientist, research (life sciences)password [email or web address]  [facility]  Office  [phone]  CC: Primary care physician; No PCP Per Patient   Note: This dictation was prepared with Dragon dictation along with smaller phrase technology. Any transcriptional errors that result from this process are unintentional.
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
## 2016-01-05 NOTE — Discharge Instructions (Signed)
Heart healthy diet. °Fall precaution. °HHPT. °

## 2016-01-06 LAB — HEMOGLOBIN A1C
Hgb A1c MFr Bld: 5.4 % (ref 4.8–5.6)
Mean Plasma Glucose: 108 mg/dL

## 2016-01-06 NOTE — Clinical Social Work Note (Addendum)
Clinical Social Work Assessment  Patient Details  Name: Maxwell Webb MRN: [id number] Date of Birth: 24-Aug-1950  Date of referral:  01/05/16               Reason for consult:  Facility Placement                Permission sought to share information with:  Oceanographeracility Contact Representative Permission granted to share information::  Yes, Verbal Permission Granted  Name::        Agency::  [facility] Staff  Relationship::     Contact Information:     Housing/Transportation Living arrangements for the past 2 months:  Assisted Living Facility Source of Information:  Patient Patient Interpreter Needed:  None Criminal Activity/Legal Involvement Pertinent to Current Situation/Hospitalization:  No - Comment as needed Significant Relationships:  None Lives with:  Facility Resident Do you feel safe going back to the place where you live?  Yes Need for family participation in patient care:  No (Coment)  Care giving concerns:  Patient [place] not really like [facility] [place] because he feels he is too young to be living at ALF.  However he did not express any concerns about returning back to ALF.   Social Worker assessment / plan:  Patient is a 65 year old male who is alert and oriented x4 and able to express his feelings.  [facility], and he expressed he feels like he is too young to be at ALF, but he is okay with returning back.  Patient expressed that he is interested in looking at different ALFs to consider moving in the future.  Patient expressed that he is not in a very Christmas like mood, CSW provided psychosocial support for patient and reflective listening.  Patient expressed being appreciative of CSW listening and talking with patient.  Patient stated he will accept home health, CSW told patient that he can have a home health social worker visit him to help with trying to find a different ALF if he requests.  Patient stated he would appreciate having a Child psychotherapistsocial worker meet with him.   Patient did not express any other concerns or issues.   Employment status:  Disabled (Comment on whether or not currently receiving Disability) Insurance information:  Managed Care PT Recommendations:  Home with Home Health Information / Referral to community resources:     Patient/Family's Response to care:  Patient in agreement to returning back to ALF.  Patient/Family's Understanding of and Emotional Response to Diagnosis, Current Treatment, and Prognosis:  Patient expressed being sad about returning back to ALF because he feels like he is too young to be in ALF, CSW provided psychosocial support for patient.  Emotional Assessment Appearance:  Appears stated age Attitude/Demeanor/Rapport:    Affect (typically observed):  Appropriate, Calm Orientation:  Oriented to Place, Oriented to  Time, Oriented to Situation, Oriented to Self Alcohol / Substance use:  Not Applicable Psych involvement (Current and /or in the community):  Yes (Comment)  Discharge Needs  Concerns to be addressed:  Other (Comment Required ([facility], but is willing to return. ) Readmission within the last 30 days:  No Current discharge risk:  None Barriers to Discharge:  No Barriers Identified   Ervin KnackEric R. Kenneith Stief, MSW, Theresia MajorsLCSWA [phone]  Mon-Fri 8a-4:30p 01/05/16  5:30pm

## 2016-02-05 ENCOUNTER — Encounter: Payer: Self-pay | Admitting: Emergency Medicine

## 2016-02-05 ENCOUNTER — Emergency Department: Payer: BC Managed Care – PPO

## 2016-02-05 ENCOUNTER — Emergency Department
Admission: EM | Admit: 2016-02-05 | Discharge: 2016-02-05 | Discharge status: Against Medical Advice | Payer: BC Managed Care – PPO | Attending: Student in an Organized Health Care Education/Training Program | Admitting: Student in an Organized Health Care Education/Training Program

## 2016-02-05 DIAGNOSIS — I10 Essential (primary) hypertension: Secondary | ICD-10-CM | POA: Insufficient documentation

## 2016-02-05 DIAGNOSIS — F17210 Nicotine dependence, cigarettes, uncomplicated: Secondary | ICD-10-CM | POA: Insufficient documentation

## 2016-02-05 DIAGNOSIS — R200 Anesthesia of skin: Secondary | ICD-10-CM | POA: Diagnosis not present

## 2016-02-05 DIAGNOSIS — Z5181 Encounter for therapeutic drug level monitoring: Secondary | ICD-10-CM | POA: Diagnosis not present

## 2016-02-05 DIAGNOSIS — Z79899 Other long term (current) drug therapy: Secondary | ICD-10-CM | POA: Diagnosis not present

## 2016-02-05 HISTORY — DX: Hyperlipidemia, unspecified: E78.5

## 2016-02-05 LAB — COMPREHENSIVE METABOLIC PANEL
ALBUMIN: 4 g/dL (ref 3.5–5.0)
ALT: 20 U/L (ref 17–63)
ANION GAP: 8 (ref 5–15)
AST: 26 U/L (ref 15–41)
Alkaline Phosphatase: 70 U/L (ref 38–126)
BILIRUBIN TOTAL: 0.6 mg/dL (ref 0.3–1.2)
BUN: 17 mg/dL (ref 6–20)
CHLORIDE: 106 mmol/L (ref 101–111)
CO2: 25 mmol/L (ref 22–32)
Calcium: 9.2 mg/dL (ref 8.9–10.3)
Creatinine, Ser: 1.02 mg/dL (ref 0.61–1.24)
GFR calc Af Amer: >60 mL/min (ref >60)
GFR calc non Af Amer: >60 mL/min (ref >60)
GLUCOSE: 108 mg/dL — AB (ref 65–99)
POTASSIUM: 3.9 mmol/L (ref 3.5–5.1)
SODIUM: 139 mmol/L (ref 135–145)
TOTAL PROTEIN: 7.7 g/dL (ref 6.5–8.1)

## 2016-02-05 LAB — DIFFERENTIAL
BASOS ABS: 0.1 10*3/uL (ref 0–0.1)
BASOS PCT: 1 %
EOS ABS: 0.6 10*3/uL (ref 0–0.7)
EOS PCT: 7 %
LYMPHS ABS: 1.8 10*3/uL (ref 1.0–3.6)
Lymphocytes Relative: 21 %
Monocytes Absolute: 0.9 10*3/uL (ref 0.2–1.0)
Monocytes Relative: 10 %
NEUTROS PCT: 61 %
Neutro Abs: 5.2 10*3/uL (ref 1.4–6.5)

## 2016-02-05 LAB — CBC
HCT: 43.6 % (ref 40.0–52.0)
Hemoglobin: 15 g/dL (ref 13.0–18.0)
MCH: 33.3 pg (ref 26.0–34.0)
MCHC: 34.4 g/dL (ref 32.0–36.0)
MCV: 96.6 fL (ref 80.0–100.0)
PLATELETS: 257 10*3/uL (ref 150–440)
RBC: 4.52 MIL/uL (ref 4.40–5.90)
RDW: 13.6 % (ref 11.5–14.5)
WBC: 8.6 10*3/uL (ref 3.8–10.6)

## 2016-02-05 LAB — PROTIME-INR
INR: 0.95
PROTHROMBIN TIME: 12.7 s (ref 11.4–15.2)

## 2016-02-05 LAB — APTT: APTT: 32 s (ref 24–36)

## 2016-02-05 LAB — TROPONIN I: Troponin I: <0.03 ng/mL (ref <0.03)

## 2016-02-05 MED ORDER — ASPIRIN 81 MG PO TBEC: 325.0000 mg | DELAYED_RELEASE_TABLET | Freq: Every day | ORAL | 0 refills | Status: DC

## 2016-02-05 MED ORDER — ACETAMINOPHEN 325 MG PO TABS
650.0000 mg | ORAL_TABLET | Freq: Once | ORAL | Status: DC
  Filled 2016-02-05: qty 2

## 2016-02-05 MED ORDER — IBUPROFEN 400 MG PO TABS
400.0000 mg | ORAL_TABLET | Freq: Once | ORAL | Status: AC
  Administered 2016-02-05: 400 mg via ORAL
  Filled 2016-02-05: qty 1

## 2016-02-05 NOTE — ED Provider Notes (Signed)
[facility] Emergency Department Provider Note    First MD Initiated Contact with Patient 02/05/16 [id number]     (approximate)  I have reviewed the triage vital signs and the nursing notes.   HISTORY  Chief Complaint Numbness    HPI Maxwell Webb is a 66 y.o. male with the chief complaint of numbness on the left side of his hand and left leg that started this morning. Patient states he is otherwise feeling fine last night. States that he woke up and walking breakfast this morning and noted this numbness and tingling. Denies any weakness. Denies any headache at this time. Denies any previous history of stroke.  He denies any chest pain, no nausea or vomiting. No recent fevers. No visual disturbance.   Past Medical History:  Diagnosis Date  . Hyperlipemia   . Hypertension    No family history on file. Past Surgical History:  Procedure Laterality Date  . BELOW KNEE LEG AMPUTATION Bilateral    Patient Active Problem List   Diagnosis Date Noted  . TIA (transient ischemic attack) 01/04/2016  . 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 Authorizing Provider  citalopram (CELEXA) 20 MG tablet Take 20 mg by mouth daily.   Yes Historical Provider, MD  gabapentin (NEURONTIN) 100 MG capsule Take 100 mg by mouth 2 (two) times daily.   Yes Historical Provider, MD  aspirin EC 81 MG EC tablet Take 1 tablet (81 mg total) by mouth daily. Patient not taking: Reported on 02/05/2016 01/06/16   Shaune PollackQing Chen, MD  atorvastatin (LIPITOR) 40 MG tablet Take 1 tablet (40 mg total) by mouth daily at 6 PM. Patient not taking: Reported on 02/05/2016 01/05/16   Shaune PollackQing Chen, MD    Allergies Patient has no known allergies.    Social History Social History  Substance Use Topics  . Smoking status: Current Every Day Smoker    Packs/day:  0.50    Years: 20.00    Types: Cigarettes  . Smokeless tobacco: Never Used  . Alcohol use 3.6 oz/week    6 Cans of beer per week     Comment: once a /week    Review of Systems Patient denies headaches, rhinorrhea, blurry vision, numbness, shortness of breath, chest pain, edema, cough, abdominal pain, nausea, vomiting, diarrhea, dysuria, fevers, rashes or hallucinations unless otherwise stated above in HPI. ____________________________________________   PHYSICAL EXAM:  VITAL SIGNS: Vitals:   02/05/16 1000 02/05/16 1115  BP: 103/80 (!) 117/95  Pulse: 86 82  Resp: (!) 23 18  Temp:      Constitutional: Alert and oriented, in no acute distress. Eyes: Conjunctivae are normal. PERRL. EOMI. Head: Atraumatic. Nose: No congestion/rhinnorhea. Mouth/Throat: Mucous membranes are moist.  Oropharynx non-erythematous. Neck: No stridor. Painless ROM. No cervical spine tenderness to palpation Hematological/Lymphatic/Immunilogical: No cervical lymphadenopathy. Cardiovascular: Normal rate, regular rhythm. Grossly normal heart sounds.  Good peripheral circulation. Respiratory: Normal respiratory effort.  No retractions. Lungs CTAB. Gastrointestinal: Soft and nontender. No distention. No abdominal bruits. No CVA tenderness.  Musculoskeletal: s/p bilateral BKA. Neurologic:  CN- intact.  No facial droop, Normal FNF.  Normal heel to shin.  Sensation intact bilaterally. Normal speech and language. No gross focal neurologic deficits are appreciated. No gait instability.  Skin:  Skin is warm, dry and intact. No rash noted. Psychiatric: Mood and affect are normal. Speech and  behavior are normal.  ____________________________________________   LABS (all labs ordered are listed, but only abnormal results are displayed)  Results for orders placed or performed during the hospital encounter of 02/05/16 (from the past 24 hour(s))  Protime-INR     Status: None   Collection Time: 02/05/16 10:01 AM    Result Value Ref Range   Prothrombin Time 12.7 11.4 - 15.2 seconds   INR 0.95   APTT     Status: None   Collection Time: 02/05/16 10:01 AM  Result Value Ref Range   aPTT 32 24 - 36 seconds  CBC     Status: None   Collection Time: 02/05/16 10:01 AM  Result Value Ref Range   WBC 8.6 3.8 - 10.6 K/uL   RBC 4.52 4.40 - 5.90 MIL/uL   Hemoglobin 15.0 13.0 - 18.0 g/dL   HCT 16.1 09.6 - 04.5 %   MCV 96.6 80.0 - 100.0 fL   MCH 33.3 26.0 - 34.0 pg   MCHC 34.4 32.0 - 36.0 g/dL   RDW 40.9 81.1 - 91.4 %   Platelets 257 150 - 440 K/uL  Differential     Status: None   Collection Time: 02/05/16 10:01 AM  Result Value Ref Range   Neutrophils Relative % 61 %   Neutro Abs 5.2 1.4 - 6.5 K/uL   Lymphocytes Relative 21 %   Lymphs Abs 1.8 1.0 - 3.6 K/uL   Monocytes Relative 10 %   Monocytes Absolute 0.9 0.2 - 1.0 K/uL   Eosinophils Relative 7 %   Eosinophils Absolute 0.6 0 - 0.7 K/uL   Basophils Relative 1 %   Basophils Absolute 0.1 0 - 0.1 K/uL  Comprehensive metabolic panel     Status: Abnormal   Collection Time: 02/05/16 10:01 AM  Result Value Ref Range   Sodium 139 135 - 145 mmol/L   Potassium 3.9 3.5 - 5.1 mmol/L   Chloride 106 101 - 111 mmol/L   CO2 25 22 - 32 mmol/L   Glucose, Bld 108 (H) 65 - 99 mg/dL   BUN 17 6 - 20 mg/dL   Creatinine, Ser 7.82 0.61 - 1.24 mg/dL   Calcium 9.2 8.9 - 95.6 mg/dL   Total Protein 7.7 6.5 - 8.1 g/dL   Albumin 4.0 3.5 - 5.0 g/dL   AST 26 15 - 41 U/L   ALT 20 17 - 63 U/L   Alkaline Phosphatase 70 38 - 126 U/L   Total Bilirubin 0.6 0.3 - 1.2 mg/dL   GFR calc non Af Amer >60 >60 mL/min   GFR calc Af Amer >60 >60 mL/min   Anion gap 8 5 - 15  Troponin I     Status: None   Collection Time: 02/05/16 10:01 AM  Result Value Ref Range   Troponin I <0.03 <0.03 ng/mL   ____________________________________________  EKG My review and personal interpretation at Time: 9:47   Indication: numbness  Rate: 90  Rhythm: sinus Axis: normal Other: ivcd, no  STEMI ____________________________________________  RADIOLOGY  I personally reviewed all radiographic images ordered to evaluate for the above acute complaints and reviewed radiology reports and findings.  These findings were personally discussed with the patient.  Please see medical record for radiology report. ____________________________________________   PROCEDURES  Procedure(s) performed:  Procedures    Critical Care performed: no ____________________________________________   INITIAL IMPRESSION / ASSESSMENT AND PLAN / ED COURSE  Pertinent labs & imaging results that were available during my care of the patient were reviewed  by me and considered in my medical decision making (see chart for details).  DDX: cva, tia, hypoglycemia, dehydration, electrolyte abnormality, dissection, sepsis   Maxwell Webb is a 66 y.o. who presents to the ED with left-sided numbness as described above.  Patient is AFVSS in ED. Exam as above. Given current presentation have considered the above differential. Patient with no focal neuro deficit on exam. Neck has mild subjective tingling but no significant sensory deficit as I can appreciate clinically. CT imaging ordered to evaluate for CVA shows no acute abnormality. Blood work ordered to evaluate for left O abnormality shows none. No evidence of dysrhythmia.  I discussed my concern for CVA with patient and have recommended MRI to further characterize. Patient states at this time that his symptoms are improving and that he has no symptoms at this point. At this point I did recommend continued evaluation with MRI and further monitoring and patient agreed.  Clinical Course as of Feb 05 1712  Maxwell Webb Feb 05, 2016  1137 Called to bedside as patient declined MRI and is requesting discharge back to nursing facility. The patient insisted on leaving AMA. I talked to the patient at length and carefully explained, in layman's terms, that the standard of care is to  obtain MRI for further evaluation of his symptoms of possilbe stroke, TIA or   be admitted for further monitoring and that by leaving against medical advice they not allowing [place] to treat their acute medical conditions according to our best medical judgement, and that a serious adverse outcome including increased pain, suffering, short- or long-term disability and even death, could result. I also explained to the patient that we respect their point-of-view and are not angry. I made sure that they understood that they can, and should, return at any time if they change their mind. I also offered any anxiolysis that may help him complete the MRI. The patient indicated understanding of our conversation but still desired to leave against medical advice. I deemed him to be of sound mind to make this decision and demonstrate understanding of our conversation.  I will provide rx for ASA and referral to neurology and have encourage he return if he changes his mind.     [PR]    Clinical Course User Index [PR] Willy Eddy, MD     ____________________________________________   FINAL CLINICAL IMPRESSION(S) / ED DIAGNOSES  Final diagnoses:  Left sided numbness      NEW MEDICATIONS STARTED DURING THIS VISIT:  New Prescriptions   No medications on file     Note:  This document was prepared using Dragon voice recognition software and may include unintentional dictation errors.    Willy Eddy, MD 02/05/16 [phone]

## 2016-02-05 NOTE — ED Notes (Signed)
Patient refusing MRI at this time. States, "I feel fine and just want to go home." MD made aware. Will continue to monitor.

## 2016-02-05 NOTE — ED Notes (Signed)
RN in patients room to answer call light. Pt is requesting to go back to [facility]. Patient was informed that MD had ordered an MRI. Pt states that he does not care about having an MRI and would like to [facility]. Patient states that the numbness in his hand has improved some but it is still present.  Patient was informed that RN would notify MD.   Dr. Roxan Hockeyobinson notified of above.

## 2016-02-05 NOTE — Discharge Instructions (Signed)
Please return for any concerns or if you wish to be further evaluated.

## 2016-02-05 NOTE — ED Notes (Signed)
Dr. Roxan Hockeyobinson spoke with facility and alerted them patient was returning to facility and did not want any further treatment. Dr. Roxan Hockeyobinson informed Mick SellLakeisha that the patient had no neuro deficits and was fine to return to facility if he did not want any further evaluation, however, it was against medical advice to leave.  Mick SellLakeisha reported to Dr. Roxan Hockeyobinson that it was ok for the patient to return to the facility if he chose to.

## 2016-02-05 NOTE — ED Triage Notes (Signed)
[facility] from [facility], patient states that upon waking he noted numbness in the left side of his face, numbness in his left hand, and numbness in the left leg. Patient last known well time was last PM. No facial droop noted, grips equal bilaterally.

## 2016-02-09 ENCOUNTER — Emergency Department
Admission: EM | Admit: 2016-02-09 | Discharge: 2016-02-09 | Discharge status: Against Medical Advice | Payer: BC Managed Care – PPO | Attending: Emergency Medicine | Admitting: Emergency Medicine

## 2016-02-09 ENCOUNTER — Encounter: Payer: Self-pay | Admitting: Emergency Medicine

## 2016-02-09 ENCOUNTER — Emergency Department: Payer: BC Managed Care – PPO

## 2016-02-09 DIAGNOSIS — F17210 Nicotine dependence, cigarettes, uncomplicated: Secondary | ICD-10-CM | POA: Diagnosis not present

## 2016-02-09 DIAGNOSIS — R519 Headache, unspecified: Secondary | ICD-10-CM

## 2016-02-09 DIAGNOSIS — Z7982 Long term (current) use of aspirin: Secondary | ICD-10-CM | POA: Insufficient documentation

## 2016-02-09 DIAGNOSIS — I10 Essential (primary) hypertension: Secondary | ICD-10-CM | POA: Insufficient documentation

## 2016-02-09 DIAGNOSIS — R51 Headache: Secondary | ICD-10-CM | POA: Insufficient documentation

## 2016-02-09 DIAGNOSIS — Z79899 Other long term (current) drug therapy: Secondary | ICD-10-CM | POA: Insufficient documentation

## 2016-02-09 DIAGNOSIS — R531 Weakness: Secondary | ICD-10-CM | POA: Insufficient documentation

## 2016-02-09 MED ORDER — SODIUM CHLORIDE 0.9 % IV BOLUS (SEPSIS): 1000.0000 mL | Freq: Once | INTRAVENOUS | Status: DC

## 2016-02-09 NOTE — ED Notes (Signed)
Ambulance arrived for pt p/u  - pt refused to let me update vs.

## 2016-02-09 NOTE — ED Triage Notes (Signed)
States had left side numbness today. Also had the same 4 days ago and then it resolved. States he believes its from the cheap w/c he is in. States the police took it from him.

## 2016-02-09 NOTE — ED Notes (Signed)
Pt refusing to allow me to draw blood - dr Don Perkingveronese speaking with patient who wants to leave ama

## 2016-02-09 NOTE — Discharge Instructions (Signed)
As I explained to you know you have refused to be evaluated for possible stroke causing your symptoms. You may be having small transient strokes and therefore you are a much higher risk of developing a larger stroke that can cause significant deficits which could put you on a wheelchair for the rest of your life or even cause your death. Therefore it is imperative that you follow-up with your primary care doctor tomorrow for evaluation as an outpatient of stroke. We are happy to see you at any time if you change your mind to finish your evaluation.

## 2016-02-09 NOTE — ED Provider Notes (Signed)
[facility] Emergency Department Provider Note  ____________________________________________  Time seen: Approximately 1:34 PM  I have reviewed the triage vital signs and the nursing notes.   HISTORY  Chief Complaint Numbness   HPI Rhyatt Muska is a 66 y.o. male with a history of smoking, alcohol abuse, hypertension, hyperlipidemia who presents for evaluation of left-sided numbness. Patient has had a similar episode 4 days ago when he was seen here for evaluation. Had a normal CT and refused MRI at that time. Patient reports that he woke up this morning with the symptoms of left upper and lower extremity numbness. No weakness, he also has had a global headache since this morning that he describes as 5 out of 10. Patient has a history of headaches and this is similar to his chronic headaches. He denies neurological deficits with these headaches in the past. He does not have a personal or family history of stroke. He denies chest pain back pain, abdominal pain, nausea or vomiting, diarrhea. He reports that he took 4 baby aspirins for his headache earlier this morning. He reports that his symptoms have markedly improved however he still has mild numbness on his left extremities. No slurred speech, no dysarthria, no diplopia, no dysphagia.  Past Medical History:  Diagnosis Date  . Hyperlipemia   . Hypertension     Patient Active Problem List   Diagnosis Date Noted  . TIA (transient ischemic attack) 01/04/2016  . 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 BELOW KNEE LEG AMPUTATION Bilateral     Prior to Admission medications   Medication Sig Start Date End Date Taking? Authorizing Provider  aspirin 81 MG EC tablet Take 4 tablets (325 mg total) by mouth daily. 02/05/16   Willy Eddy, MD  atorvastatin (LIPITOR) 40 MG tablet Take 1  tablet (40 mg total) by mouth daily at 6 PM. Patient not taking: Reported on 02/05/2016 01/05/16   Shaune Pollack, MD  citalopram (CELEXA) 20 MG tablet Take 20 mg by mouth daily.    Historical Provider, MD  gabapentin (NEURONTIN) 100 MG capsule Take 100 mg by mouth 2 (two) times daily.    Historical Provider, MD    Allergies Patient has no known allergies.  History reviewed. No pertinent family history.  Social History Social History  Substance Use Topics  . Smoking status: Current Every Day Smoker    Packs/day: 0.50    Years: 20.00    Types: Cigarettes  . Smokeless tobacco: Never Used  . Alcohol use 3.6 oz/week    6 Cans of beer per week     Comment: once a /week    Review of Systems  Constitutional: Negative for fever. Eyes: Negative for visual changes. ENT: Negative for sore throat. Neck: No neck pain  Cardiovascular: Negative for chest pain. Respiratory: Negative for shortness of breath. Gastrointestinal: Negative for abdominal pain, vomiting or diarrhea. Genitourinary: Negative for dysuria. Musculoskeletal: Negative for back pain. Skin: Negative for rash. Neurological: + global HA and L sided numbness Psych: No SI or HI  ____________________________________________   PHYSICAL EXAM:  VITAL SIGNS: ED Triage Vitals  Enc Vitals Group     BP 02/09/16 1311 115/85     Pulse Rate 02/09/16 1311 (!) 110     Resp 02/09/16 1311 20     Temp 02/09/16 1311 98 F (36.7 C)     Temp src --  SpO2 02/09/16 1311 95 %     Weight 02/09/16 1312 200 lb (90.7 kg)     Height --      Head Circumference --      Peak Flow --      Pain Score 02/09/16 1312 0     Pain Loc --      Pain Edu? --      Excl. in GC? --     Constitutional: Alert and oriented. Well appearing and in no apparent distress. HEENT:      Head: Normocephalic and atraumatic.         Eyes: Conjunctivae are normal. Sclera is non-icteric. EOMI. PERRL      Mouth/Throat: Mucous membranes are moist.       Neck:  Supple with no signs of meningismus. Cardiovascular: Tachycardic with regular rhythm. No murmurs, gallops, or rubs. 2+ symmetrical distal pulses are present in all extremities. No JVD. Respiratory: Normal respiratory effort. Lungs are clear to auscultation bilaterally. No wheezes, crackles, or rhonchi.  Gastrointestinal: Soft, non tender, and non distended with positive bowel sounds. No rebound or guarding. Genitourinary: No CVA tenderness. Musculoskeletal: b/l AKA Neurologic: Normal speech and language. A & O x3, PERRL, no nystagmus, CN II-XII intact, motor testing reveals good tone and bulk throughout. There is no evidence of pronator drift or dysmetria. Muscle strength is 5/5 throughout. Deep tendon reflexes are 2+ throughout with downgoing toes. Sensory examination is intact. Gait is normal. Skin: Skin is warm, dry and intact. No rash noted. Psychiatric: Mood and affect are normal. Speech and behavior are normal.  ____________________________________________   LABS (all labs ordered are listed, but only abnormal results are displayed)  Labs Reviewed  ETHANOL  PROTIME-INR  CBC  DIFFERENTIAL  COMPREHENSIVE METABOLIC PANEL  TROPONIN I  URINE DRUG SCREEN, QUALITATIVE ([facility] ONLY)   ____________________________________________  EKG  none  ____________________________________________  RADIOLOGY  none ____________________________________________   PROCEDURES  Procedure(s) performed: None Procedures Critical Care performed:  None ____________________________________________   INITIAL IMPRESSION / ASSESSMENT AND PLAN / ED COURSE  66 y.o. male with a history of smoking, alcohol abuse, hypertension, hyperlipidemia who presents for evaluation of left-sided numbness and HA since waking up this morning. Last normal yesterday evening. Patient is neurologically intact at this time. We'll get a head CT, basic blood work, CBG to rule out stroke, TIA, hypoglycemia. Will monitor on  telemetry.   Clinical Course as of Feb 08 1402  Thu Feb 09, 2016  1356 Patient is requesting discharge and is refusing blood work and imaging. He tells me that his symptoms have resolved and he would like to go back to his facility. Had a long conversation with him that I'm concerned that his been having TIAs and that he is a much higher risk for a devastating stroke. He has refused MRI on his last visit 4 days ago. He tells me that he "doesn't buy that these are strokes" and that "we are not doing anything for him". Patient has been in the ED less than and I explained to him that even though he hasn't been here long enough he hasn't really been evaluated by a physician and I have already ordered blood work and a CAT scan. I recommended he stay for evaluation for TIA and explained to him that he might have a much bigger stroke that could lead to significant morbility and mortality. Patient understands that but tells me that he doesn't care and he wants to go back home. Patient be  discharged AMA.  [CV]    Clinical Course User Index [CV] Nita Sicklearolina Standley Bargo, MD    02/09/2016 at 2:02 PM:  The patient requested to leave.  I considered this to be leaving against medical advice. I personally discussed the following with them:  1)  That they currently had a medical condition of L sided weakness and HA and I am concerned that they may have TIA, stroke, head bleed  2)  My proposed course of evaluation and treatment includes, but is not limited to,  Blood work, CT head, neurology consult, and possible admission for stroke workup.  Benefits of staying include possible diagnosis or excluding of TIA, stroke or head bleed, which if identified early would lead to appropriate intervention in a timely manner lessening the burden of disability and death.  3) Risks of leaving before this had been completed include: misdiagnosis, worsening illness leading up to and including prolonged or permanent disability or  death.  Specific risks pertinent, but not all inclusive, of their current medical condition include but are not limited to much larger stroke with severe neurological deficits, death, airway compromise.  I also discussed alternatives including observation.  Despite this they stated they wanted to leave because he does not believe in my concerns and refused further evaluation, treatment, or admission at this time.   They appeared clinically sober, were mentating appropriately, were free from distracting injury, had adequately controlled acute pain, appeared to have intact insight, judgment, and reason, and in my opinion had the capacity to make this decision.  Specifically, they were able to verbally state back in a coherent manner their current medical condition/current diagnosis, the proposed course of evaluation and/or treatment, and the risks, benefits, and alternatives of treatment versus leaving against medical advice.   They understand that they may return to seek medical attention here at ANY time they want.  I strongly advised them to return to the Emergency Department immediately if they experience any new or worsening symptoms that concern them, or simply if they reconsider continued evaluation and/or treatment as previously discussed.  This would be without any repercussions, though they understand they likely will need to wait again in the Emergency Department if other patients are in front of them, rather than being brought straight back.  They understood this is another advantage of staying, but still insisted upon leaving.  I recommended they follow-up with his doctor at the earliest available opportunity/appointment for further evaluation and treatment.   The patient was discharged against medical advice.  They did accept written discharge instructions.    Pertinent labs & imaging results that were available during my care of the patient were reviewed by me and considered in my  medical decision making (see chart for details).    ____________________________________________   FINAL CLINICAL IMPRESSION(S) / ED DIAGNOSES  Final diagnoses:  Acute nonintractable headache, unspecified headache type  Left-sided weakness      NEW MEDICATIONS STARTED DURING THIS VISIT:  New Prescriptions   No medications on file     Note:  This document was prepared using Dragon voice recognition software and may include unintentional dictation errors.    Nita Sicklearolina Joyce Heitman, MD 02/09/16 [phone]

## 2016-02-10 ENCOUNTER — Emergency Department: Payer: BC Managed Care – PPO

## 2016-02-10 ENCOUNTER — Encounter: Payer: Self-pay | Admitting: Emergency Medicine

## 2016-02-10 ENCOUNTER — Emergency Department
Admission: EM | Admit: 2016-02-10 | Discharge: 2016-02-10 | Disposition: A | Discharge status: Home / Self Care | Payer: BC Managed Care – PPO | Attending: Emergency Medicine | Admitting: Emergency Medicine

## 2016-02-10 DIAGNOSIS — R51 Headache: Secondary | ICD-10-CM | POA: Diagnosis not present

## 2016-02-10 DIAGNOSIS — F17210 Nicotine dependence, cigarettes, uncomplicated: Secondary | ICD-10-CM | POA: Insufficient documentation

## 2016-02-10 DIAGNOSIS — Z79899 Other long term (current) drug therapy: Secondary | ICD-10-CM | POA: Insufficient documentation

## 2016-02-10 DIAGNOSIS — R0789 Other chest pain: Secondary | ICD-10-CM | POA: Insufficient documentation

## 2016-02-10 DIAGNOSIS — Z7982 Long term (current) use of aspirin: Secondary | ICD-10-CM | POA: Diagnosis not present

## 2016-02-10 DIAGNOSIS — Z5321 Procedure and treatment not carried out due to patient leaving prior to being seen by health care provider: Secondary | ICD-10-CM | POA: Diagnosis not present

## 2016-02-10 DIAGNOSIS — I10 Essential (primary) hypertension: Secondary | ICD-10-CM | POA: Insufficient documentation

## 2016-02-10 LAB — BASIC METABOLIC PANEL
Anion gap: 8 (ref 5–15)
BUN: 14 mg/dL (ref 6–20)
CALCIUM: 8.9 mg/dL (ref 8.9–10.3)
CO2: 21 mmol/L — ABNORMAL LOW (ref 22–32)
CREATININE: 1.02 mg/dL (ref 0.61–1.24)
Chloride: 106 mmol/L (ref 101–111)
GFR calc Af Amer: >60 mL/min (ref >60)
GLUCOSE: 107 mg/dL — AB (ref 65–99)
Potassium: 3.9 mmol/L (ref 3.5–5.1)
Sodium: 135 mmol/L (ref 135–145)

## 2016-02-10 LAB — CBC
HCT: 47.2 % (ref 40.0–52.0)
Hemoglobin: 16.3 g/dL (ref 13.0–18.0)
MCH: 33.1 pg (ref 26.0–34.0)
MCHC: 34.6 g/dL (ref 32.0–36.0)
MCV: 95.7 fL (ref 80.0–100.0)
Platelets: 187 10*3/uL (ref 150–440)
RBC: 4.93 MIL/uL (ref 4.40–5.90)
RDW: 13.6 % (ref 11.5–14.5)
WBC: 4.8 10*3/uL (ref 3.8–10.6)

## 2016-02-10 LAB — TROPONIN I: Troponin I: <0.03 ng/mL (ref <0.03)

## 2016-02-10 NOTE — ED Notes (Addendum)
Pt reports his chest does not hurt and he does not know why he was here. Pt called a ride from [facility] to come get him. Pt encouraged to stay for evaluation but stated he was going home.

## 2016-02-10 NOTE — ED Triage Notes (Addendum)
Pt in via EMS from [facility] with complaints of chest pain this morning, pt reports associated weakness at that time.  Pt denies any chest pain now, pt complains of headache, pt diaphoretic upon assessment.  Pt also reports numbness/tingling to left arm/leg x approximately 2 days.  Pt evaluated for same yesterday.  No immediate distress noted at this time.

## 2016-02-10 NOTE — ED Triage Notes (Signed)
Pt arrived via EMS from home for reports of non-radiating left side CP. Pt seen here yesterday. EMS reports 99.0 oral, 105HR, 124/85, 95% RA.

## 2016-02-16 ENCOUNTER — Emergency Department: Payer: BC Managed Care – PPO

## 2016-02-16 ENCOUNTER — Observation Stay: Payer: BC Managed Care – PPO

## 2016-02-16 ENCOUNTER — Encounter: Payer: Self-pay | Admitting: Emergency Medicine

## 2016-02-16 ENCOUNTER — Inpatient Hospital Stay
Admission: EM | Admit: 2016-02-16 | Discharge: 2016-02-17 | DRG: 065 | Disposition: A | Discharge status: Skilled Nursing Facility | Payer: BC Managed Care – PPO | Attending: Internal Medicine | Admitting: Internal Medicine

## 2016-02-16 DIAGNOSIS — G8194 Hemiplegia, unspecified affecting left nondominant side: Secondary | ICD-10-CM | POA: Diagnosis present

## 2016-02-16 DIAGNOSIS — R29703 NIHSS score 3: Secondary | ICD-10-CM | POA: Diagnosis present

## 2016-02-16 DIAGNOSIS — I639 Cerebral infarction, unspecified: Secondary | ICD-10-CM | POA: Diagnosis not present

## 2016-02-16 DIAGNOSIS — I739 Peripheral vascular disease, unspecified: Secondary | ICD-10-CM | POA: Diagnosis present

## 2016-02-16 DIAGNOSIS — F17210 Nicotine dependence, cigarettes, uncomplicated: Secondary | ICD-10-CM | POA: Diagnosis present

## 2016-02-16 DIAGNOSIS — E785 Hyperlipidemia, unspecified: Secondary | ICD-10-CM | POA: Diagnosis present

## 2016-02-16 DIAGNOSIS — Z79899 Other long term (current) drug therapy: Secondary | ICD-10-CM

## 2016-02-16 DIAGNOSIS — Z89511 Acquired absence of right leg below knee: Secondary | ICD-10-CM

## 2016-02-16 DIAGNOSIS — Z89512 Acquired absence of left leg below knee: Secondary | ICD-10-CM

## 2016-02-16 DIAGNOSIS — I10 Essential (primary) hypertension: Secondary | ICD-10-CM | POA: Diagnosis present

## 2016-02-16 DIAGNOSIS — R200 Anesthesia of skin: Secondary | ICD-10-CM | POA: Diagnosis present

## 2016-02-16 DIAGNOSIS — Z8249 Family history of ischemic heart disease and other diseases of the circulatory system: Secondary | ICD-10-CM

## 2016-02-16 DIAGNOSIS — M6281 Muscle weakness (generalized): Secondary | ICD-10-CM

## 2016-02-16 DIAGNOSIS — Z7982 Long term (current) use of aspirin: Secondary | ICD-10-CM

## 2016-02-16 DIAGNOSIS — Z833 Family history of diabetes mellitus: Secondary | ICD-10-CM

## 2016-02-16 HISTORY — DX: Tobacco use: Z72.0

## 2016-02-16 HISTORY — DX: Amnestic disorder due to known physiological condition: F04

## 2016-02-16 HISTORY — DX: Peripheral vascular disease, unspecified: I73.9

## 2016-02-16 LAB — DIFFERENTIAL
Basophils Absolute: 0 10*3/uL (ref 0–0.1)
Basophils Relative: 1 %
EOS PCT: 5 %
Eosinophils Absolute: 0.3 10*3/uL (ref 0–0.7)
LYMPHS ABS: 1.9 10*3/uL (ref 1.0–3.6)
LYMPHS PCT: 30 %
MONO ABS: 0.7 10*3/uL (ref 0.2–1.0)
Monocytes Relative: 11 %
Neutro Abs: 3.5 10*3/uL (ref 1.4–6.5)
Neutrophils Relative %: 53 %

## 2016-02-16 LAB — CBC
HCT: 46.8 % (ref 40.0–52.0)
Hemoglobin: 15.7 g/dL (ref 13.0–18.0)
MCH: 32.4 pg (ref 26.0–34.0)
MCHC: 33.7 g/dL (ref 32.0–36.0)
MCV: 96.3 fL (ref 80.0–100.0)
PLATELETS: 155 10*3/uL (ref 150–440)
RBC: 4.86 MIL/uL (ref 4.40–5.90)
RDW: 13 % (ref 11.5–14.5)
WBC: 6.5 10*3/uL (ref 3.8–10.6)

## 2016-02-16 LAB — COMPREHENSIVE METABOLIC PANEL
ALK PHOS: 75 U/L (ref 38–126)
ALT: 24 U/L (ref 17–63)
ANION GAP: 7 (ref 5–15)
AST: 28 U/L (ref 15–41)
Albumin: 3.7 g/dL (ref 3.5–5.0)
BILIRUBIN TOTAL: 0.6 mg/dL (ref 0.3–1.2)
BUN: 14 mg/dL (ref 6–20)
CALCIUM: 8.7 mg/dL — AB (ref 8.9–10.3)
CO2: 26 mmol/L (ref 22–32)
Chloride: 107 mmol/L (ref 101–111)
Creatinine, Ser: 0.92 mg/dL (ref 0.61–1.24)
GFR calc non Af Amer: >60 mL/min (ref >60)
Glucose, Bld: 124 mg/dL — ABNORMAL HIGH (ref 65–99)
Potassium: 3.7 mmol/L (ref 3.5–5.1)
Sodium: 140 mmol/L (ref 135–145)
TOTAL PROTEIN: 7.4 g/dL (ref 6.5–8.1)

## 2016-02-16 LAB — APTT: aPTT: 33 seconds (ref 24–36)

## 2016-02-16 LAB — GLUCOSE, CAPILLARY: Glucose-Capillary: 121 mg/dL — ABNORMAL HIGH (ref 65–99)

## 2016-02-16 LAB — PROTIME-INR
INR: 0.96
PROTHROMBIN TIME: 12.8 s (ref 11.4–15.2)

## 2016-02-16 LAB — TROPONIN I

## 2016-02-16 MED ORDER — ASPIRIN 325 MG PO TABS
325.0000 mg | ORAL_TABLET | Freq: Every day | ORAL | Status: DC
  Administered 2016-02-17: 09:00:00 325 mg via ORAL
  Filled 2016-02-16: qty 1

## 2016-02-16 MED ORDER — LORAZEPAM 2 MG/ML IJ SOLN
2.0000 mg | Freq: Once | INTRAMUSCULAR | Status: AC
  Administered 2016-02-16: 2 mg via INTRAVENOUS
  Filled 2016-02-16: qty 1

## 2016-02-16 MED ORDER — ASPIRIN 81 MG PO CHEW
324.0000 mg | CHEWABLE_TABLET | Freq: Once | ORAL | Status: AC
  Administered 2016-02-16: 324 mg via ORAL
  Filled 2016-02-16: qty 4

## 2016-02-16 MED ORDER — ACETAMINOPHEN 650 MG RE SUPP: 650.0000 mg | RECTAL | Status: DC | PRN

## 2016-02-16 MED ORDER — ACETAMINOPHEN 325 MG PO TABS: 650.0000 mg | ORAL_TABLET | ORAL | Status: DC | PRN

## 2016-02-16 MED ORDER — ATORVASTATIN CALCIUM 20 MG PO TABS
40.0000 mg | ORAL_TABLET | Freq: Every day | ORAL | Status: DC
  Administered 2016-02-16 – 2016-02-17 (×2): 40 mg via ORAL
  Filled 2016-02-16 (×2): qty 2

## 2016-02-16 MED ORDER — NICOTINE 21 MG/24HR TD PT24
MEDICATED_PATCH | TRANSDERMAL | Status: AC
  Administered 2016-02-16: 21 mg via TRANSDERMAL
  Filled 2016-02-16: qty 1

## 2016-02-16 MED ORDER — IBUPROFEN 600 MG PO TABS
600.0000 mg | ORAL_TABLET | Freq: Once | ORAL | Status: AC
  Administered 2016-02-16: 600 mg via ORAL

## 2016-02-16 MED ORDER — GABAPENTIN 100 MG PO CAPS
100.0000 mg | ORAL_CAPSULE | Freq: Two times a day (BID) | ORAL | Status: DC
  Administered 2016-02-16 – 2016-02-17 (×2): 100 mg via ORAL
  Filled 2016-02-16 (×2): qty 1

## 2016-02-16 MED ORDER — IBUPROFEN 600 MG PO TABS
ORAL_TABLET | ORAL | Status: AC
  Administered 2016-02-16: 600 mg via ORAL
  Filled 2016-02-16: qty 1

## 2016-02-16 MED ORDER — STROKE: EARLY STAGES OF RECOVERY BOOK
Freq: Once | Status: AC
  Administered 2016-02-16: 17:00:00

## 2016-02-16 MED ORDER — NICOTINE 21 MG/24HR TD PT24: 21.0000 mg | MEDICATED_PATCH | Freq: Every day | TRANSDERMAL | Status: DC

## 2016-02-16 MED ORDER — DIAZEPAM 5 MG PO TABS: 10.0000 mg | ORAL_TABLET | Freq: Once | ORAL | Status: DC

## 2016-02-16 MED ORDER — ACETAMINOPHEN 160 MG/5ML PO SOLN: 650.0000 mg | ORAL | Status: DC | PRN

## 2016-02-16 MED ORDER — IBUPROFEN 200 MG PO TABS
200.0000 mg | ORAL_TABLET | Freq: Four times a day (QID) | ORAL | Status: DC | PRN
  Administered 2016-02-17: 200 mg via ORAL
  Filled 2016-02-16: qty 1

## 2016-02-16 MED ORDER — ENOXAPARIN SODIUM 40 MG/0.4ML ~~LOC~~ SOLN
40.0000 mg | SUBCUTANEOUS | Status: DC
  Administered 2016-02-16: 22:00:00 40 mg via SUBCUTANEOUS
  Filled 2016-02-16: qty 0.4

## 2016-02-16 MED ORDER — NICOTINE 21 MG/24HR TD PT24
21.0000 mg | MEDICATED_PATCH | Freq: Once | TRANSDERMAL | Status: AC
  Administered 2016-02-16: 21 mg via TRANSDERMAL

## 2016-02-16 MED ORDER — DULOXETINE HCL 60 MG PO CPEP
60.0000 mg | ORAL_CAPSULE | Freq: Every day | ORAL | Status: DC
  Administered 2016-02-17: 60 mg via ORAL
  Filled 2016-02-16: qty 1

## 2016-02-16 MED ORDER — IOPAMIDOL (ISOVUE-370) INJECTION 76%
75.0000 mL | Freq: Once | INTRAVENOUS | Status: AC | PRN
  Administered 2016-02-16: 75 mL via INTRAVENOUS

## 2016-02-16 MED ORDER — CLOPIDOGREL BISULFATE 75 MG PO TABS
75.0000 mg | ORAL_TABLET | Freq: Every day | ORAL | Status: DC
  Administered 2016-02-16 – 2016-02-17 (×2): 75 mg via ORAL
  Filled 2016-02-16 (×2): qty 1

## 2016-02-16 NOTE — H&P (Addendum)
[facility] - [place] at [facility]   PATIENT NAME: Maxwell Webb    MR#:  [id number]  DATE OF BIRTH:  07/03/1950  DATE OF ADMISSION:  02/16/2016  PRIMARY CARE PHYSICIAN: Pcp Not In System   REQUESTING/REFERRING PHYSICIAN:  Minna AntisKevin Paduchowski Md  CHIEF COMPLAINT:   Chief Complaint  Patient presents with  . Code Stroke    HISTORY OF PRESENT ILLNESS: Maxwell Webb  is a 66 y.o. male with a known history of Peripheral vascular disease, central hypertension, hyperlipidemia who is presenting to the ED with complaints of having left-sided numbness. Patient was recently hospitalized in December 2017 with similar type of symptoms. At that time here or more of the brain which possible pontine she really. He was seen by Dr. Sherryll BurgerShah as outpatient. He is taking aspirin on a daily basis. At that time and what workup including an echocardiogram carotid Dopplers. Patient is chronically in a wheelchair.       PAST MEDICAL HISTORY:   Past Medical History:  Diagnosis Date  . Hyperlipemia   . Hypertension   . Nicotine abuse   . PVD (peripheral vascular disease) (HCC)     PAST SURGICAL HISTORY:  Past Surgical History:  Procedure Laterality Date  . BELOW KNEE LEG AMPUTATION Bilateral     SOCIAL HISTORY:  Social History  Substance Use Topics  . Smoking status: Current Every Day Smoker    Packs/day: 0.50    Years: 20.00    Types: Cigarettes  . Smokeless tobacco: Never Used  . Alcohol use 3.6 oz/week    6 Cans of beer per week     Comment: once a /week    FAMILY HISTORY:  Family History  Problem Relation Age of Onset  . Hypertension Mother     DRUG ALLERGIES: No Known Allergies  REVIEW OF SYSTEMS:   CONSTITUTIONAL: No fever, fatigue or weakness.  EYES: No blurred or double vision.  EARS, NOSE, AND THROAT: No tinnitus or ear pain.  RESPIRATORY: No cough, shortness of breath, wheezing or hemoptysis.  CARDIOVASCULAR: No chest pain, orthopnea, edema.  GASTROINTESTINAL: No  nausea, vomiting, diarrhea or abdominal pain.  GENITOURINARY: No dysuria, hematuria.  ENDOCRINE: No polyuria, nocturia,  HEMATOLOGY: No anemia, easy bruising or bleeding SKIN: No rash or lesion. MUSCULOSKELETAL: No joint pain or arthritis.   NEUROLOGIC:Left-sided numbness  PSYCHIATRY: No anxiety or depression.    MEDICATIONS AT HOME:  Prior to Admission medications   Medication Sig Start Date End Date Taking? Authorizing Provider  aspirin 81 MG EC tablet Take 4 tablets (325 mg total) by mouth daily. 02/05/16  Yes Willy EddyPatrick Robinson, MD  atorvastatin (LIPITOR) 40 MG tablet Take 1 tablet (40 mg total) by mouth daily at 6 PM. Patient taking differently: Take 40 mg by mouth every evening.  01/05/16  Yes Shaune PollackQing Chen, MD  DULoxetine (CYMBALTA) 60 MG capsule Take 60 mg by mouth daily.   Yes Historical Provider, MD  gabapentin (NEURONTIN) 100 MG capsule Take 100 mg by mouth 2 (two) times daily.   Yes Historical Provider, MD  ibuprofen (ADVIL,MOTRIN) 200 MG tablet Take 200 mg by mouth every 6 (six) hours as needed.   Yes Historical Provider, MD      PHYSICAL EXAMINATION:   VITAL SIGNS: Blood pressure (!) 136/103, pulse 78, temperature 98.7 F (37.1 C), temperature source Oral, resp. rate 19, weight 200 lb (90.7 kg), SpO2 98 %.  GENERAL:  66 y.o.-year-old patient lying in the bed with no acute distress.  EYES: Pupils equal,  round, reactive to light and accommodation. No scleral icterus. Extraocular muscles intact.  HEENT: Head atraumatic, normocephalic. Oropharynx and nasopharynx clear.  NECK:  Supple, no jugular venous distention. No thyroid enlargement, no tenderness.  LUNGS: Normal breath sounds bilaterally, no wheezing, rales,rhonchi or crepitation. No use of accessory muscles of respiration.  CARDIOVASCULAR: S1, S2 normal. No murmurs, rubs, or gallops.  ABDOMEN: Soft, nontender, nondistended. Bowel sounds present. No organomegaly or mass.  EXTREMITIES: No pedal edema, cyanosis, or clubbing.  B/l bka NEUROLOGIC: Cranial nerves II through XII are intact. Muscle strength 5/5 in all extremities. Sensation intact. Gait not checked. Decreased sensation in left upper and left lower ext PSYCHIATRIC: The patient is alert and oriented x 3.  SKIN: No obvious rash, lesion, or ulcer.   LABORATORY PANEL:   CBC  Recent Labs Lab 02/10/16 1035 02/16/16 1031  WBC 4.8 6.5  HGB 16.3 15.7  HCT 47.2 46.8  PLT 187 155  MCV 95.7 96.3  MCH 33.1 32.4  MCHC 34.6 33.7  RDW 13.6 13.0  LYMPHSABS  --  1.9  MONOABS  --  0.7  EOSABS  --  0.3  BASOSABS  --  0.0   ------------------------------------------------------------------------------------------------------------------  Chemistries   Recent Labs Lab 02/10/16 1035 02/16/16 1031  NA 135 140  K 3.9 3.7  CL 106 107  CO2 21* 26  GLUCOSE 107* 124*  BUN 14 14  CREATININE 1.02 0.92  CALCIUM 8.9 8.7*  AST  --  28  ALT  --  24  ALKPHOS  --  75  BILITOT  --  0.6   ------------------------------------------------------------------------------------------------------------------ estimated creatinine clearance is 105.4 mL/min (by C-G formula based on SCr of 0.92 mg/dL). ------------------------------------------------------------------------------------------------------------------ No results for input(s): TSH, T4TOTAL, T3FREE, THYROIDAB in the last 72 hours.  Invalid input(s): FREET3   Coagulation profile  Recent Labs Lab 02/16/16 1031  INR 0.96   ------------------------------------------------------------------------------------------------------------------- No results for input(s): DDIMER in the last 72 hours. -------------------------------------------------------------------------------------------------------------------  Cardiac Enzymes  Recent Labs Lab 02/10/16 1035 02/16/16 1031  TROPONINI <0.03 <0.03    ------------------------------------------------------------------------------------------------------------------ Invalid input(s): POCBNP  ---------------------------------------------------------------------------------------------------------------  Urinalysis    Component Value Date/Time   COLORURINE YELLOW (A) 11/05/2015 2210   APPEARANCEUR CLEAR (A) 11/05/2015 2210   LABSPEC 1.017 11/05/2015 2210   PHURINE 6.0 11/05/2015 2210   GLUCOSEU NEGATIVE 11/05/2015 2210   HGBUR NEGATIVE 11/05/2015 2210   BILIRUBINUR NEGATIVE 11/05/2015 2210   KETONESUR NEGATIVE 11/05/2015 2210   PROTEINUR 100 (A) 11/05/2015 2210   NITRITE NEGATIVE 11/05/2015 2210   LEUKOCYTESUR NEGATIVE 11/05/2015 2210     RADIOLOGY: Mr Brain Wo Contrast  Result Date: 02/16/2016 CLINICAL DATA:  Left arm weakness.  History of stroke. EXAM: MRI HEAD WITHOUT CONTRAST TECHNIQUE: Multiplanar, multiecho pulse sequences of the brain and surrounding structures were obtained without intravenous contrast. COMPARISON:  01/05/2016 FINDINGS: Brain: 1 cm focus of restricted diffusion in the anterior genu of the corpus callosum, left para median. DWI hyperintensity in the central pons and right corona radiata noted previously are less intense. Scattered white matter signal abnormalities in the cerebral hemispheres consistent with chronic microvascular disease in this patient with vascular risk factors. A thin confluent rim of gliosis is present around the frontal horns of the lateral ventricles. Normal brain volume for age. No hemorrhage, hydrocephalus, or mass. Vascular: Preserved flow voids Skull and upper cervical spine: Negative Sinuses/Orbits: Mild patchy mucosal thickening, most notable in the right sphenoid sinus where there is retained secretions. IMPRESSION: 1. 1 cm acute infarct at the genu of  the corpus callosum, left paramedian. 2. Chronic microvascular disease. Electronically Signed   By: Marnee Spring M.D.   On: 02/16/2016  15:14   Ct Head Code Stroke W/o Cm  Addendum Date: 02/16/2016   ADDENDUM REPORT: 02/16/2016 11:12 ADDENDUM: Study discussed by telephone with Dr. Caryn Bee PADUCHOWSKI on 02/16/2016 at 1101 hours. Electronically Signed   By: Odessa Fleming M.D.   On: 02/16/2016 11:12   Result Date: 02/16/2016 CLINICAL DATA:  Code stroke. 66 year old male with left side numbness since 0900 hours. Initial encounter. EXAM: CT HEAD WITHOUT CONTRAST TECHNIQUE: Contiguous axial images were obtained from the base of the skull through the vertex without intravenous contrast. COMPARISON:  Head CT 02/05/2016 and earlier. FINDINGS: Brain: Patchy right greater than left white matter hypodensity appears stable. Stable gray-white matter differentiation throughout the brain. No midline shift, ventriculomegaly, mass effect, evidence of mass lesion, intracranial hemorrhage or evidence of cortically based acute infarction. Vascular: Calcified atherosclerosis at the skull base. Skull: No acute osseous abnormality identified. Sinuses/Orbits: Mild mucosal thickening and bubbly opacity in the right sphenoid sinus not significantly changed. Other Visualized paranasal sinuses and mastoids are stable and well pneumatized. Other: Negative orbit and scalp soft tissues. ASPECTS (Alberta Stroke Program Early CT Score) - Ganglionic level infarction (caudate, lentiform nuclei, internal capsule, insula, M1-M3 cortex): 7 - Supraganglionic infarction (M4-M6 cortex): 3 Total score (0-10 with 10 being normal): 10 IMPRESSION: 1. Stable non contrast CT appearance of the brain since 02/05/2016. Chronic white matter changes. 2. ASPECTS is 10. Electronically Signed: By: Odessa Fleming M.D. On: 02/16/2016 10:56    EKG: Orders placed or performed during the hospital encounter of 02/16/16  . ED EKG  . ED EKG  . EKG 12-Lead  . EKG 12-Lead    IMPRESSION AND PLAN: Patient is 66 year old presenting with left-sided numbness noted to have cva  1. Acute cva Passed swallow eval On  asa add Plavix cta of neck and head Pt eval  2. Essential HTN Not on any meds will monitor for now  3. hyperlipidemia continue atorvastatin flp in am  4. nueropathy continue gabapentin  5. Misc lovenox for dvt proph  6. Nicotine abuse smoking cessation provided 4 min spent recommend to stop smoking nitocitne patch will be offered  All the records are reviewed and case discussed with ED provider. Management plans discussed with the patient, family and they are in agreement.  CODE STATUS: Code Status History    Date Active Date Inactive Code Status Order ID Comments User Context   01/04/2016  3:03 PM 01/05/2016  8:53 PM Full Code [id number]  Enedina Finner, MD Inpatient       TOTAL TIME TAKING CARE OF THIS PATIENT: 50 minutes.    Auburn Bilberry M.D on 02/16/2016 at 4:00 PM  Between 7am to 6pm - Pager - [phone]  After 6pm go to [email or web address] - password [email or web address]  [facility]  Office  [phone]  CC: Primary care physician; Pcp Not In System

## 2016-02-16 NOTE — ED Notes (Signed)
Called code stroke 1025

## 2016-02-16 NOTE — ED Notes (Signed)
Patient transported to MRI 

## 2016-02-16 NOTE — ED Notes (Signed)
Blood walked down to lab by this tech. 

## 2016-02-16 NOTE — ED Notes (Signed)
Patient returned from MRI.

## 2016-02-16 NOTE — ED Notes (Signed)
Patient given meal tray.  Will continue to monitor.   

## 2016-02-16 NOTE — ED Notes (Signed)
Called patient's guardian to inform her of patient's admission.

## 2016-02-16 NOTE — Progress Notes (Signed)
Order for enoxaparin 30 mg subQ daily for DVT prophylaxis changed to 40 mg daily dose per anticoagulation protocol for CrCl > 30 mL/min.  Cindi CarbonMary M Jacobi Nile, PharmD 02/16/16 4:58 PM

## 2016-02-16 NOTE — ED Triage Notes (Signed)
Patient to ER from [facility] via EMS for c/o left leg and arm numbness. Patient states he believes symptoms were present when he woke up. Patient went to bed around 2300 last night. Patient states he woke around 0900 this am. Patient reports generalized mild headache as well that has been present since waking. Patient has bilateral BKA.

## 2016-02-16 NOTE — ED Notes (Signed)
MRI screening complete.

## 2016-02-16 NOTE — ED Provider Notes (Addendum)
[facility] Emergency Department Provider Note  Time seen: 10:55 AM  I have reviewed the triage vital signs and the nursing notes.   HISTORY  Chief Complaint Code Stroke    HPI Maxwell Webb is a 66 y.o. male with a past medical history of hypertension, hyperlipidemia, bilateral BKA, presents the emergency department with left facial and left arm weakness/numbness. According to the patient he went to sleep at 11 PM feeling normal. He awoke this morning and felt the left side of his face and left upper extremity were weak and tingling. Denies any history of CVA or TIA in the past. Denies any headache slurred speech or trouble thinking.  Past Medical History:  Diagnosis Date  . Hyperlipemia   . Hypertension     Patient Active Problem List   Diagnosis Date Noted  . TIA (transient ischemic attack) 01/04/2016  . 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 BELOW KNEE LEG AMPUTATION Bilateral     Prior to Admission medications   Medication Sig Start Date End Date Taking? Authorizing Provider  aspirin 81 MG EC tablet Take 4 tablets (325 mg total) by mouth daily. 02/05/16  Yes Willy Eddy, MD  atorvastatin (LIPITOR) 40 MG tablet Take 1 tablet (40 mg total) by mouth daily at 6 PM. Patient taking differently: Take 40 mg by mouth every evening.  01/05/16  Yes Shaune Pollack, MD  DULoxetine (CYMBALTA) 60 MG capsule Take 60 mg by mouth daily.   Yes Historical Provider, MD  gabapentin (NEURONTIN) 100 MG capsule Take 100 mg by mouth 2 (two) times daily.   Yes Historical Provider, MD  ibuprofen (ADVIL,MOTRIN) 200 MG tablet Take 200 mg by mouth every 6 (six) hours as needed.   Yes Historical Provider, MD    No Known Allergies  No family history on file.  Social History Social History  Substance Use Topics  . Smoking status: Current Every Day  Smoker    Packs/day: 0.50    Years: 20.00    Types: Cigarettes  . Smokeless tobacco: Never Used  . Alcohol use 3.6 oz/week    6 Cans of beer per week     Comment: once a /week    Review of Systems Constitutional: Negative for fever. Cardiovascular: Negative for chest pain. Respiratory: Negative for shortness of breath. Gastrointestinal: Negative for abdominal pain, vomiting Musculoskeletal: Negative for back pain. Neurological: Negative for headache. Positive for left face and left arm weakness/tingling 10-point ROS otherwise negative.  ____________________________________________   PHYSICAL EXAM:  VITAL SIGNS: ED Triage Vitals  Enc Vitals Group     BP 02/16/16 1037 136/74     Pulse Rate 02/16/16 1037 79     Resp 02/16/16 1037 16     Temp 02/16/16 1037 98.7 F (37.1 C)     Temp Source 02/16/16 1037 Oral     SpO2 02/16/16 1034 93 %     Weight 02/16/16 1038 200 lb (90.7 kg)     Height --      Head Circumference --      Peak Flow --      Pain Score 02/16/16 1038 3     Pain Loc --      Pain Edu? --      Excl. in GC? --     Constitutional: Alert and oriented. Well appearing and in no distress. Eyes: Normal exam ENT  Head: Normocephalic and atraumatic.   Mouth/Throat: Mucous membranes are moist. Cardiovascular: Normal rate, regular rhythm. No murmur Respiratory: Normal respiratory effort without tachypnea nor retractions. Breath sounds are clear  Gastrointestinal: Soft and nontender. No distention.   Musculoskeletal: Nontender extremities. Bilateral BKA. Neurologic:  Normal speech and language. No cranial nerve deficits identified. Equal grip strengths. No pronator drift. Patient does state mild sensory deficits of left upper extremity no other sensory deficits identified. Skin:  Skin is warm, dry and intact.  Psychiatric: Mood and affect are normal.  ____________________________________________    EKG  EKG reviewed and interpreted by myself shows normal  sinus rhythm at 74 bpm. Narrow QRS, normal axis, normal intervals, no ST changes.  ____________________________________________    RADIOLOGY  CT shows no acute abnormality MRI shows 1 cm acute infarct. ____________________________________________   INITIAL IMPRESSION / ASSESSMENT AND PLAN / ED COURSE  Pertinent labs & imaging results that were available during my care of the patient were reviewed by me and considered in my medical decision making (see chart for details).  The patient presents to the emergency department with left facial and left arm numbness. States the facial numbness has resolved but continues to have numbness sensation in the left hand. Equal grip strengths with no pronator drift or other neurological deficits identified. Patient was a code stroke upon arrival. Neurology has seen the patient. Currently awaiting labs and imaging results.  Neurology has seen the patient. Patient was recently admitted for similar complaints and had a largely normal workup at that time. Patient was unable to tolerate MRI. Dr. Thad Rangereynolds of neurology believes that the patient was able to have an MRI which did not show an acute CVA the patient could be safely discharged home. We will obtain an MRI from the emergency department.  CT shows no acute abnormality.   MRI consistent with 1 cm acute infarct. Patient will be admitted to the hospital for further treatment. Patient is well outside of any TPA window was last known normal was 11 PM last night.  ____________________________________________   FINAL CLINICAL IMPRESSION(S) / ED DIAGNOSES  Left arm numbness Cerebral vascular accident   Minna AntisKevin Janyia Guion, MD 02/16/16 1514    Minna AntisKevin Cataleyah Colborn, MD 02/16/16 1529    Minna AntisKevin Ronnetta Currington, MD 02/16/16 1536

## 2016-02-16 NOTE — Consult Note (Signed)
Referring Physician: Paduchowski    Chief Complaint: Left sided numbness  HPI: Maxwell Webb is an 66 y.o. male with a history of stroke who presents with complaints of left sided numbness.  Patient recently hospitalized with similar complaints of December of 2017.  Work up at that time was incomplete due to the fact that the patient was unable to tolerate a complete MR study.  Question of pontine infarct was raised.  Patient seeing Dr. Sherryll Burger as an outpatient.  Patient went to bed last night at baseline.  Awakened this morning and experienced numbness in his left hand and left stump.  Patient presented for evaluation at that time.  Initial NIHSS of 3.  Date last known well: Date: 02/15/2016 Time last known well: Time: 23:00 tPA Given: No: Outside time window  Past Medical History:  Diagnosis Date  . Hyperlipemia   . Hypertension     Past Surgical History:  Procedure Laterality Date  . BELOW KNEE LEG AMPUTATION Bilateral     Family history: CAD, HTN, DM  Social History:  reports that he has been smoking Cigarettes.  He has a 10.00 pack-year smoking history. He has never used smokeless tobacco. He reports that he drinks about 3.6 oz of alcohol per week . He reports that he uses drugs, including Marijuana.  Allergies: No Known Allergies  Medications: I have reviewed the patient's current medications. Prior to Admission:  Prior to Admission medications   Medication Sig Start Date End Date Taking? Authorizing Provider  aspirin 81 MG EC tablet Take 4 tablets (325 mg total) by mouth daily. 02/05/16  Yes Willy Eddy, MD  atorvastatin (LIPITOR) 40 MG tablet Take 1 tablet (40 mg total) by mouth daily at 6 PM. Patient taking differently: Take 40 mg by mouth every evening.  01/05/16  Yes Shaune Pollack, MD  DULoxetine (CYMBALTA) 60 MG capsule Take 60 mg by mouth daily.   Yes Historical Provider, MD  gabapentin (NEURONTIN) 100 MG capsule Take 100 mg by mouth 2 (two) times daily.   Yes Historical  Provider, MD  ibuprofen (ADVIL,MOTRIN) 200 MG tablet Take 200 mg by mouth every 6 (six) hours as needed.   Yes Historical Provider, MD    ROS: History obtained from the patient  General ROS: negative for - chills, fatigue, fever, night sweats, weight gain or weight loss Psychological ROS: negative for - behavioral disorder, hallucinations, memory difficulties, mood swings or suicidal ideation Ophthalmic ROS: negative for - blurry vision, double vision, eye pain or loss of vision ENT ROS: negative for - epistaxis, nasal discharge, oral lesions, sore throat, tinnitus or vertigo Allergy and Immunology ROS: negative for - hives or itchy/watery eyes Hematological and Lymphatic ROS: negative for - bleeding problems, bruising or swollen lymph nodes Endocrine ROS: negative for - galactorrhea, hair pattern changes, polydipsia/polyuria or temperature intolerance Respiratory ROS: negative for - cough, hemoptysis, shortness of breath or wheezing Cardiovascular ROS: negative for - chest pain, dyspnea on exertion, edema or irregular heartbeat Gastrointestinal ROS: negative for - abdominal pain, diarrhea, hematemesis, nausea/vomiting or stool incontinence Genito-Urinary ROS: negative for - dysuria, hematuria, incontinence or urinary frequency/urgency Musculoskeletal ROS: negative for - joint swelling or muscular weakness Neurological ROS: as noted in HPI Dermatological ROS: negative for rash and skin lesion changes  Physical Examination: Blood pressure 136/74, pulse 79, temperature 98.7 F (37.1 C), temperature source Oral, resp. rate 16, weight 90.7 kg (200 lb), SpO2 96 %.  HEENT-  Normocephalic, no lesions, without obvious abnormality.  Normal external eye and  conjunctiva.  Normal TM's bilaterally.  Normal auditory canals and external ears. Normal external nose, mucus membranes and septum.  Normal pharynx. Cardiovascular- S1, S2 normal, pulses palpable throughout   Lungs- chest clear, no wheezing,  rales, normal symmetric air entry Abdomen- soft, non-tender; bowel sounds normal; no masses,  no organomegaly Extremities- Bilateral BKA's Lymph-no adenopathy palpable Musculoskeletal-no joint tenderness, deformity or swelling Skin-warm and dry, no hyperpigmentation, vitiligo, or suspicious lesions  Neurological Examination Mental Status: Alert, oriented to name and place but not to month and age.  Speech fluent without evidence of aphasia.  Able to follow 3 step commands without difficulty. Cranial Nerves: II: Discs flat bilaterally; Visual fields grossly normal, pupils equal, round, reactive to light and accommodation III,IV, VI: ptosis not present, extra-ocular motions intact bilaterally V,VII: smile symmetric, facial light touch sensation normal bilaterally VIII: hearing normal bilaterally IX,X: gag reflex present XI: bilateral shoulder shrug XII: midline tongue extension Motor: Right : Upper extremity   5/5    Left:     Upper extremity   5/5  Lower extremity   5/5     Lower extremity   5/5 Tone and bulk:normal tone throughout; no atrophy noted Sensory: Pinprick and light touch decreased in the left hand and left stump Deep Tendon Reflexes: 1+ in the upper extremities and absent at the knees bilaterally Plantars: Bilateral BKA's Cerebellar: Normal finger-to-nose testing bilaterally Gait: not tested due to absent prosthesis    Laboratory Studies:  Basic Metabolic Panel:  Recent Labs Lab 02/10/16 1035 02/16/16 1031  NA 135 140  K 3.9 3.7  CL 106 107  CO2 21* 26  GLUCOSE 107* 124*  BUN 14 14  CREATININE 1.02 0.92  CALCIUM 8.9 8.7*    Liver Function Tests:  Recent Labs Lab 02/16/16 1031  AST 28  ALT 24  ALKPHOS 75  BILITOT 0.6  PROT 7.4  ALBUMIN 3.7   No results for input(s): LIPASE, AMYLASE in the last 168 hours. No results for input(s): AMMONIA in the last 168 hours.  CBC:  Recent Labs Lab 02/10/16 1035 02/16/16 1031  WBC 4.8 6.5  NEUTROABS  --   3.5  HGB 16.3 15.7  HCT 47.2 46.8  MCV 95.7 96.3  PLT 187 155    Cardiac Enzymes:  Recent Labs Lab 02/10/16 1035 02/16/16 1031  TROPONINI <0.03 <0.03    BNP: Invalid input(s): POCBNP  CBG: No results for input(s): GLUCAP in the last 168 hours.  Microbiology: No results found for this or any previous visit.  Coagulation Studies:  Recent Labs  02/16/16 1031  LABPROT 12.8  INR 0.96    Urinalysis: No results for input(s): COLORURINE, LABSPEC, PHURINE, GLUCOSEU, HGBUR, BILIRUBINUR, KETONESUR, PROTEINUR, UROBILINOGEN, NITRITE, LEUKOCYTESUR in the last 168 hours.  Invalid input(s): APPERANCEUR  Lipid Panel:    Component Value Date/Time   CHOL 178 01/05/2016 0456   TRIG 89 01/05/2016 0456   HDL 41 01/05/2016 0456   CHOLHDL 4.3 01/05/2016 0456   VLDL 18 01/05/2016 0456   LDLCALC 119 (H) 01/05/2016 0456    HgbA1C:  Lab Results  Component Value Date   HGBA1C 5.4 01/05/2016    Urine Drug Screen:     Component Value Date/Time   LABOPIA NONE DETECTED 12/16/2015 1615   COCAINSCRNUR NONE DETECTED 12/16/2015 1615   LABBENZ NONE DETECTED 12/16/2015 1615   AMPHETMU NONE DETECTED 12/16/2015 1615   THCU NONE DETECTED 12/16/2015 1615   LABBARB NONE DETECTED 12/16/2015 1615    Alcohol Level: No results for input(s):  ETH in the last 168 hours.   Imaging: Ct Head Code Stroke W/o Cm  Addendum Date: 02/16/2016   ADDENDUM REPORT: 02/16/2016 11:12 ADDENDUM: Study discussed by telephone with Dr. Caryn BeeKEVIN PADUCHOWSKI on 02/16/2016 at 1101 hours. Electronically Signed   By: Odessa FlemingH  Hall M.D.   On: 02/16/2016 11:12   Result Date: 02/16/2016 CLINICAL DATA:  Code stroke. 66 year old male with left side numbness since 0900 hours. Initial encounter. EXAM: CT HEAD WITHOUT CONTRAST TECHNIQUE: Contiguous axial images were obtained from the base of the skull through the vertex without intravenous contrast. COMPARISON:  Head CT 02/05/2016 and earlier. FINDINGS: Brain: Patchy right greater than left  white matter hypodensity appears stable. Stable gray-white matter differentiation throughout the brain. No midline shift, ventriculomegaly, mass effect, evidence of mass lesion, intracranial hemorrhage or evidence of cortically based acute infarction. Vascular: Calcified atherosclerosis at the skull base. Skull: No acute osseous abnormality identified. Sinuses/Orbits: Mild mucosal thickening and bubbly opacity in the right sphenoid sinus not significantly changed. Other Visualized paranasal sinuses and mastoids are stable and well pneumatized. Other: Negative orbit and scalp soft tissues. ASPECTS ([place] Stroke Program Early CT Score) - Ganglionic level infarction (caudate, lentiform nuclei, internal capsule, insula, M1-M3 cortex): 7 - Supraganglionic infarction (M4-M6 cortex): 3 Total score (0-10 with 10 being normal): 10 IMPRESSION: 1. Stable non contrast CT appearance of the brain since 02/05/2016. Chronic white matter changes. 2. ASPECTS is 10. Electronically Signed: By: Odessa FlemingH  Hall M.D. On: 02/16/2016 10:56    Assessment: 66 y.o. male with history of stroke presenting with left sided numbness.  Patient outside window for tPA.  Recent work up in December of 2017 revealed unremarkable echo and carotid dopplers.  A1c was normal.  LDL was 119.  Patient was maintained on ASA.  Continues to smoke.    Stroke Risk Factors - hyperlipidemia, hypertension and smoking  Plan: 1. HgbA1c, fasting lipid panel 2. MRI of the brain without contrast.  Patient may require some sedation.   3. Therapy consults to evaluate stability 4. Prophylactic therapy-ASA 81mg  and Plavix 75mg  daily 5. NPO until RN stroke swallow screen 6. Telemetry monitoring 7. Frequent neuro checks 8. Smoking cessation counseling 9. Continue statin  Case discussed with Dr. Brita RompPaduchowski  Jaileen Janelle, MD Neurology [phone] 02/16/2016, 11:41 AM

## 2016-02-17 DIAGNOSIS — Z79899 Other long term (current) drug therapy: Secondary | ICD-10-CM | POA: Diagnosis not present

## 2016-02-17 DIAGNOSIS — I10 Essential (primary) hypertension: Secondary | ICD-10-CM | POA: Diagnosis present

## 2016-02-17 DIAGNOSIS — E785 Hyperlipidemia, unspecified: Secondary | ICD-10-CM | POA: Diagnosis present

## 2016-02-17 DIAGNOSIS — R29703 NIHSS score 3: Secondary | ICD-10-CM | POA: Diagnosis present

## 2016-02-17 DIAGNOSIS — Z89511 Acquired absence of right leg below knee: Secondary | ICD-10-CM | POA: Diagnosis not present

## 2016-02-17 DIAGNOSIS — Z833 Family history of diabetes mellitus: Secondary | ICD-10-CM | POA: Diagnosis not present

## 2016-02-17 DIAGNOSIS — I739 Peripheral vascular disease, unspecified: Secondary | ICD-10-CM | POA: Diagnosis present

## 2016-02-17 DIAGNOSIS — R200 Anesthesia of skin: Secondary | ICD-10-CM | POA: Diagnosis present

## 2016-02-17 DIAGNOSIS — G8194 Hemiplegia, unspecified affecting left nondominant side: Secondary | ICD-10-CM | POA: Diagnosis present

## 2016-02-17 DIAGNOSIS — I639 Cerebral infarction, unspecified: Secondary | ICD-10-CM | POA: Diagnosis present

## 2016-02-17 DIAGNOSIS — Z89512 Acquired absence of left leg below knee: Secondary | ICD-10-CM | POA: Diagnosis not present

## 2016-02-17 DIAGNOSIS — Z7982 Long term (current) use of aspirin: Secondary | ICD-10-CM | POA: Diagnosis not present

## 2016-02-17 DIAGNOSIS — F17210 Nicotine dependence, cigarettes, uncomplicated: Secondary | ICD-10-CM | POA: Diagnosis present

## 2016-02-17 DIAGNOSIS — Z8249 Family history of ischemic heart disease and other diseases of the circulatory system: Secondary | ICD-10-CM | POA: Diagnosis not present

## 2016-02-17 LAB — LIPID PANEL
CHOLESTEROL: 88 mg/dL (ref 0–200)
HDL: 28 mg/dL — ABNORMAL LOW (ref >40)
LDL Cholesterol: 46 mg/dL (ref 0–99)
TRIGLYCERIDES: 68 mg/dL (ref <150)
Total CHOL/HDL Ratio: 3.1 RATIO
VLDL: 14 mg/dL (ref 0–40)

## 2016-02-17 MED ORDER — ASPIRIN 81 MG PO TBEC: 81.0000 mg | DELAYED_RELEASE_TABLET | Freq: Every day | ORAL | Status: DC

## 2016-02-17 MED ORDER — TRAMADOL HCL 50 MG PO TABS
50.0000 mg | ORAL_TABLET | Freq: Once | ORAL | Status: AC
  Administered 2016-02-17: 50 mg via ORAL
  Filled 2016-02-17: qty 1

## 2016-02-17 MED ORDER — CLOPIDOGREL BISULFATE 75 MG PO TABS: 75.0000 mg | ORAL_TABLET | Freq: Every day | ORAL | 0 refills | Status: DC

## 2016-02-17 MED ORDER — ASPIRIN EC 81 MG PO TBEC: 81.0000 mg | DELAYED_RELEASE_TABLET | Freq: Every day | ORAL | Status: DC

## 2016-02-17 NOTE — Progress Notes (Signed)
Pt for discharge to assisted living. Via [facility]. Alert. No resp distress.  Discharge instructions discussed with pt and papers   With packet.

## 2016-02-17 NOTE — Care Management (Addendum)
Admitted to this facility with the diagnosis of CVA. A resident of [facility] for the last couple of months. Daughter is Melody [phone]). Department of Social Services is legal guardian. Papers placed on chart. [facility]. [employer]chair and [employer] board in his room. [facility] in the past. [facility] in the past. No home oxygen. No falls. Good appetite. Needs some help with baths, Self feeds and dresses himself. Unsure about transportation.         Gwenette GreetBrenda S Anatole Apollo RN MSN CCM Care Management

## 2016-02-17 NOTE — Progress Notes (Signed)
[facility] here to transport pt to assisted living facility. No voiced c/o

## 2016-02-17 NOTE — Discharge Summary (Signed)
[facility] - [place] at [facility]   PATIENT NAME: Maxwell Webb    MR#:  [id number]  DATE OF BIRTH:  26-Apr-1950  DATE OF ADMISSION:  02/16/2016 ADMITTING PHYSICIAN: Auburn Bilberry, MD  DATE OF DISCHARGE: 02/17/16 PRIMARY CARE PHYSICIAN: Pcp Not In System    ADMISSION DIAGNOSIS:  CVA (cerebral vascular accident) (HCC) [I63.9] Cerebral infarction, unspecified mechanism (HCC) [I63.9]  DISCHARGE DIAGNOSIS:  Active Problems:   CVA (cerebral vascular accident) (HCC)   SECONDARY DIAGNOSIS:   Past Medical History:  Diagnosis Date  . Hyperlipemia   . Hypertension   . Korsakoff disease   . Nicotine abuse   . PVD (peripheral vascular disease) [facility])     HOSPITAL COURSE:  HISTORY OF PRESENT ILLNESS: Maxwell Webb  is a 66 y.o. male with a known history of Peripheral vascular disease, central hypertension, hyperlipidemia who is presenting to the ED with complaints of having left-sided numbness. Patient was recently hospitalized in December 2017 with similar type of symptoms. At that time here or more of the brain which possible pontine she really. He was seen by Dr. Sherryll Burger as outpatient. He is taking aspirin on a daily basis. At that time and what workup including an echocardiogram carotid Dopplers. Patient is chronically in a wheelchair.   1. Acute cva Passed swallow eval, tolerating diet Outpatient cardiac monitor is recommended by neurology cta of neck and head-reveals left ICA 30% stenosis, left vertebral artery 50% stenosis and right vertebral artery 40% stenosis no interventions needed at this time Patient was seen and evaluated by neurology who is recommending to discharge patient with aspirin 81 mg, Plavix 75 mg and high intensity statin Pt eval  2. Essential HTN Not on any meds  Low-salt diet Blood pressure is well controlled at this time  3. hyperlipidemia continue atorvastatin LDL at 46  4. nueropathy continue gabapentin  5. Misc lovenox for dvt  proph  6. Nicotine abuse smoking cessation provided 4 min spent recommend to stop smoking nitocitne patch will be offered   DISCHARGE CONDITIONS:   STABLE  CONSULTS OBTAINED:  Treatment Team:  Kym Groom, MD   PROCEDURES  None   DRUG ALLERGIES:  No Known Allergies  DISCHARGE MEDICATIONS:   Current Discharge Medication List    START taking these medications   Details  clopidogrel (PLAVIX) 75 MG tablet Take 1 tablet (75 mg total) by mouth daily. Qty: 30 tablet, Refills: 0      CONTINUE these medications which have CHANGED   Details  aspirin EC 81 MG EC tablet Take 1 tablet (81 mg total) by mouth daily.      CONTINUE these medications which have NOT CHANGED   Details  atorvastatin (LIPITOR) 40 MG tablet Take 1 tablet (40 mg total) by mouth daily at 6 PM. Qty: 30 tablet, Refills: 2    DULoxetine (CYMBALTA) 60 MG capsule Take 60 mg by mouth daily.    gabapentin (NEURONTIN) 100 MG capsule Take 100 mg by mouth 2 (two) times daily.    ibuprofen (ADVIL,MOTRIN) 200 MG tablet Take 200 mg by mouth every 6 (six) hours as needed.         DISCHARGE INSTRUCTIONS:  Follow-up with primary care physician in one week Follow-up with [facility] cardiology in 3 days for outpatient cardiac monitor-LINQ placement Follow-up with neurology Dr. Clelia Croft neurology IN A  month    DIET:  Cardiac diet  DISCHARGE CONDITION:  Stable  ACTIVITY:  Activity as tolerated per PT  OXYGEN:  Home Oxygen:  No.   Oxygen Delivery: room air  DISCHARGE LOCATION:  home   If you experience worsening of your admission symptoms, develop shortness of breath, life threatening emergency, suicidal or homicidal thoughts you must seek medical attention immediately by calling 911 or calling your MD immediately  if symptoms less severe.  You Must read complete instructions/literature along with all the possible adverse reactions/side effects for all the Medicines you take and that have been prescribed to  you. Take any new Medicines after you have completely understood and accpet all the possible adverse reactions/side effects.   Please note  You were cared for by a hospitalist during your hospital stay. If you have any questions about your discharge medications or the care you received while you were in the hospital after you are discharged, you can call the unit and asked to speak with the hospitalist on call if the hospitalist that took care of you is not available. Once you are discharged, your primary care physician will handle any further medical issues. Please note that NO REFILLS for any discharge medications will be authorized once you are discharged, as it is imperative that you return to your primary care physician (or establish a relationship with a primary care physician if you do not have one) for your aftercare needs so that they can reassess your need for medications and monitor your lab values.     Today  Chief Complaint  Patient presents with  . Code Stroke   Patient is doing fine. Denies any weakness. Tolerating diet without any difficulty. 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 Denies any fatigue, weakness.  EYES: Denies blurry vision, double vision, eye pain. EARS, NOSE, THROAT: Denies tinnitus, ear pain, hearing loss. RESPIRATORY: Denies cough, wheeze, shortness of breath.  CARDIOVASCULAR: Denies chest pain, palpitations, edema.  GASTROINTESTINAL: Denies nausea, vomiting, diarrhea, abdominal pain. Denies bright red blood per rectum. GENITOURINARY: Denies dysuria, hematuria. ENDOCRINE: Denies nocturia or thyroid problems. HEMATOLOGIC AND LYMPHATIC: Denies easy bruising or bleeding. SKIN: Denies rash or lesion. MUSCULOSKELETAL: Denies pain in neck, back, shoulder, knees, hips or arthritic symptoms.  NEUROLOGIC: Denies paralysis, paresthesias.  PSYCHIATRIC: Denies anxiety or depressive  symptoms.   VITAL SIGNS:  Blood pressure (!) 128/92, pulse 83, temperature 97.5 F (36.4 C), temperature source Oral, resp. rate 18, height 6\' 8"  (2.032 m), weight 90.8 kg (200 lb 4 oz), SpO2 95 %.  I/O:    Intake/Output Summary (Last 24 hours) at 02/17/16 1410 Last data filed at 02/17/16 1300  Gross per 24 hour  Intake              720 ml  Output             1100 ml  Net             -380 ml    PHYSICAL EXAMINATION:  GENERAL:  66 y.o.-year-old patient lying in the bed with no acute distress.  EYES: Pupils equal, round, reactive to light and accommodation. No scleral icterus. Extraocular muscles intact.  HEENT: Head atraumatic, normocephalic. Oropharynx and nasopharynx clear.  NECK:  Supple, no jugular venous distention. No thyroid enlargement, no tenderness.  LUNGS: Normal breath sounds bilaterally, no wheezing, rales,rhonchi or crepitation. No use of accessory muscles of respiration.  CARDIOVASCULAR: S1, S2 normal. No murmurs, rubs, or gallops.  ABDOMEN: Soft, non-tender, non-distended. Bowel sounds present. No organomegaly or mass.  EXTREMITIES: No pedal edema, cyanosis, or clubbing.  NEUROLOGIC: Cranial nerves II through XII are intact. Muscle strength 5/5 in all extremities. Sensation intact. Gait not checked.  PSYCHIATRIC: The patient is alert and oriented x 3.  SKIN: No obvious rash, lesion, or ulcer.   DATA REVIEW:   CBC  Recent Labs Lab 02/16/16 1031  WBC 6.5  HGB 15.7  HCT 46.8  PLT 155    Chemistries   Recent Labs Lab 02/16/16 1031  NA 140  K 3.7  CL 107  CO2 26  GLUCOSE 124*  BUN 14  CREATININE 0.92  CALCIUM 8.7*  AST 28  ALT 24  ALKPHOS 75  BILITOT 0.6    Cardiac Enzymes  Recent Labs Lab 02/16/16 1031  TROPONINI <0.03    Microbiology Results  No results found for this or any previous visit.  RADIOLOGY:  Ct Angio Head W Or Wo Contrast  Result Date: 02/16/2016 CLINICAL DATA:  66 y/o  M; acute stroke. EXAM: CT ANGIOGRAPHY HEAD AND  NECK TECHNIQUE: Multidetector CT imaging of the head and neck was performed using the standard protocol during bolus administration of intravenous contrast. Multiplanar CT image reconstructions and MIPs were obtained to evaluate the vascular anatomy. Carotid stenosis measurements (when applicable) are obtained utilizing NASCET criteria, using the distal internal carotid diameter as the denominator. CONTRAST:  75 cc Isovue 370 COMPARISON:  01/04/2016 carotid ultrasound. FINDINGS: CTA NECK FINDINGS Aortic arch: Standard branching. Imaged portion shows no evidence of aneurysm or dissection. No significant stenosis of the major arch vessel origins. Aortic atherosclerosis with mild calcification. Right carotid system: No evidence of dissection, stenosis (50% or greater) or occlusion. Moderate calcified plaque of the bifurcation without significant stenosis. Left carotid system: No evidence of dissection, stenosis (50% or greater) or occlusion. Moderate calcified plaque of the bifurcation without significant stenosis. Mid upper common carotid artery fibrofatty plaque with mild 30% stenosis. Vertebral arteries: Right dominant. Right vertebral artery origin mixed plaque with mild 40% stenosis. Tortuous left vertebral artery origin with calcified plaque and approximately moderate 50% stenosis. Skeleton: Moderate cervical spondylosis greatest at the C5 through C7 levels with there are prominent discogenic degenerative changes. No high-grade bony canal stenosis. Other neck: Negative. Upper chest: Left AP window enlarged lymph node measuring 10 mm short axis. Small cluster of nodules in the right upper lobe, likely focal bronchiolitis. Review of the MIP images confirms the above findings CTA HEAD FINDINGS Anterior circulation: No significant stenosis, proximal occlusion, aneurysm, or vascular malformation. Moderate calcific atherosclerosis of cavernous and paraclinoid segments with mild paraclinoid stenosis bilaterally.  Posterior circulation: No significant stenosis, proximal occlusion, aneurysm, or vascular malformation. Minimal calcific plaque of right V4 segment. Venous sinuses: As permitted by contrast timing, patent. Anatomic variants: Small anterior communicating artery. No posterior communicating artery identified, likely hypoplastic or absent. Review of the MIP images confirms the above findings IMPRESSION: 1. Carotid and vertebral arteries of the neck are patent without dissection or aneurysm. 2. Patent circle of Willis without significant stenosis, proximal occlusion, aneurysm, or vascular malformation. 3. Moderate calcified plaque of bilateral carotid bifurcation without significant stenosis. 4. Fibrofatty plaque of left mid upper common carotid artery with mild 30% stenosis. 5. Right vertebral artery origin mixed plaque with mild 40% stenosis. 6. Left vertebral artery origin tortuosity and calcified plaque with approximately moderate 50% stenosis. 7. Calcified plaque of bilateral internal carotid arteries with mild paraclinoid segment stenosis. 8. Small cluster of nodules left upper lobe, likely for a colitis. Left aortopulmonary window lymphadenopathy of uncertain significance, probably reactive. Electronically Signed  By: Mitzi Hansen M.D.   On: 02/16/2016 16:46   Ct Angio Neck W Or Wo Contrast  Result Date: 02/16/2016 CLINICAL DATA:  66 y/o  M; acute stroke. EXAM: CT ANGIOGRAPHY HEAD AND NECK TECHNIQUE: Multidetector CT imaging of the head and neck was performed using the standard protocol during bolus administration of intravenous contrast. Multiplanar CT image reconstructions and MIPs were obtained to evaluate the vascular anatomy. Carotid stenosis measurements (when applicable) are obtained utilizing NASCET criteria, using the distal internal carotid diameter as the denominator. CONTRAST:  75 cc Isovue 370 COMPARISON:  01/04/2016 carotid ultrasound. FINDINGS: CTA NECK FINDINGS Aortic arch: Standard  branching. Imaged portion shows no evidence of aneurysm or dissection. No significant stenosis of the major arch vessel origins. Aortic atherosclerosis with mild calcification. Right carotid system: No evidence of dissection, stenosis (50% or greater) or occlusion. Moderate calcified plaque of the bifurcation without significant stenosis. Left carotid system: No evidence of dissection, stenosis (50% or greater) or occlusion. Moderate calcified plaque of the bifurcation without significant stenosis. Mid upper common carotid artery fibrofatty plaque with mild 30% stenosis. Vertebral arteries: Right dominant. Right vertebral artery origin mixed plaque with mild 40% stenosis. Tortuous left vertebral artery origin with calcified plaque and approximately moderate 50% stenosis. Skeleton: Moderate cervical spondylosis greatest at the C5 through C7 levels with there are prominent discogenic degenerative changes. No high-grade bony canal stenosis. Other neck: Negative. Upper chest: Left AP window enlarged lymph node measuring 10 mm short axis. Small cluster of nodules in the right upper lobe, likely focal bronchiolitis. Review of the MIP images confirms the above findings CTA HEAD FINDINGS Anterior circulation: No significant stenosis, proximal occlusion, aneurysm, or vascular malformation. Moderate calcific atherosclerosis of cavernous and paraclinoid segments with mild paraclinoid stenosis bilaterally. Posterior circulation: No significant stenosis, proximal occlusion, aneurysm, or vascular malformation. Minimal calcific plaque of right V4 segment. Venous sinuses: As permitted by contrast timing, patent. Anatomic variants: Small anterior communicating artery. No posterior communicating artery identified, likely hypoplastic or absent. Review of the MIP images confirms the above findings IMPRESSION: 1. Carotid and vertebral arteries of the neck are patent without dissection or aneurysm. 2. Patent circle of Willis without  significant stenosis, proximal occlusion, aneurysm, or vascular malformation. 3. Moderate calcified plaque of bilateral carotid bifurcation without significant stenosis. 4. Fibrofatty plaque of left mid upper common carotid artery with mild 30% stenosis. 5. Right vertebral artery origin mixed plaque with mild 40% stenosis. 6. Left vertebral artery origin tortuosity and calcified plaque with approximately moderate 50% stenosis. 7. Calcified plaque of bilateral internal carotid arteries with mild paraclinoid segment stenosis. 8. Small cluster of nodules left upper lobe, likely for a colitis. Left aortopulmonary window lymphadenopathy of uncertain significance, probably reactive. Electronically Signed   By: Mitzi Hansen M.D.   On: 02/16/2016 16:46   Mr Brain Wo Contrast  Result Date: 02/16/2016 CLINICAL DATA:  Left arm weakness.  History of stroke. EXAM: MRI HEAD WITHOUT CONTRAST TECHNIQUE: Multiplanar, multiecho pulse sequences of the brain and surrounding structures were obtained without intravenous contrast. COMPARISON:  01/05/2016 FINDINGS: Brain: 1 cm focus of restricted diffusion in the anterior genu of the corpus callosum, left para median. DWI hyperintensity in the central pons and right corona radiata noted previously are less intense. Scattered white matter signal abnormalities in the cerebral hemispheres consistent with chronic microvascular disease in this patient with vascular risk factors. A thin confluent rim of gliosis is present around the frontal horns of the lateral ventricles. Normal brain volume for  age. No hemorrhage, hydrocephalus, or mass. Vascular: Preserved flow voids Skull and upper cervical spine: Negative Sinuses/Orbits: Mild patchy mucosal thickening, most notable in the right sphenoid sinus where there is retained secretions. IMPRESSION: 1. 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 2. Chronic microvascular disease. Electronically Signed   By:  Marnee SpringJonathon  Watts M.D.   On: 02/16/2016 15:14   Ct Head Code Stroke W/o Cm  Addendum Date: 02/16/2016   ADDENDUM REPORT: 02/16/2016 11:12 ADDENDUM: Study discussed by telephone with Dr. Caryn BeeKEVIN PADUCHOWSKI on 02/16/2016 at 1101 hours. Electronically Signed   By: Odessa FlemingH  Hall M.D.   On: 02/16/2016 11:12   Result Date: 02/16/2016 CLINICAL DATA:  Code stroke. 66 year old male with left side numbness since 0900 hours. Initial encounter. EXAM: CT HEAD WITHOUT CONTRAST TECHNIQUE: Contiguous axial images were obtained from the base of the skull through the vertex without intravenous contrast. COMPARISON:  Head CT 02/05/2016 and earlier. FINDINGS: Brain: Patchy right greater than left white matter hypodensity appears stable. Stable gray-white matter differentiation throughout the brain. No midline shift, ventriculomegaly, mass effect, evidence of mass lesion, intracranial hemorrhage or evidence of cortically based acute infarction. Vascular: Calcified atherosclerosis at the skull base. Skull: No acute osseous abnormality identified. Sinuses/Orbits: Mild mucosal thickening and bubbly opacity in the right sphenoid sinus not significantly changed. Other Visualized paranasal sinuses and mastoids are stable and well pneumatized. Other: Negative orbit and scalp soft tissues. ASPECTS (Alberta Stroke Program Early CT Score) - Ganglionic level infarction (caudate, lentiform nuclei, internal capsule, insula, M1-M3 cortex): 7 - Supraganglionic infarction (M4-M6 cortex): 3 Total score (0-10 with 10 being normal): 10 IMPRESSION: 1. Stable non contrast CT appearance of the brain since 02/05/2016. Chronic white matter changes. 2. ASPECTS is 10. Electronically Signed: By: Odessa FlemingH  Hall M.D. On: 02/16/2016 10:56    EKG:   Orders placed or performed during the hospital encounter of 02/16/16  . ED EKG  . ED EKG  . EKG 12-Lead  . EKG 12-Lead      Management plans discussed with the patient, family and they are in agreement.  CODE STATUS:      Code Status Orders        Start     Ordered   02/16/16 1653  Full code  Continuous     02/16/16 1652    Code Status History    Date Active Date Inactive Code Status Order ID Comments User Context   01/04/2016  3:03 PM 01/05/2016  8:53 PM Full Code [id number]  Enedina FinnerSona Patel, MD Inpatient      TOTAL TIME TAKING CARE OF THIS PATIENT: 45 minutes.   Note: This dictation was prepared with Dragon dictation along with smaller phrase technology. Any transcriptional errors that result from this process are unintentional.   @[facility] @  on 02/17/2016 at 2:10 PM  Between 7am to 6pm - Pager - [phone]  After 6pm go to [email or web address] - password [email or web address]  [facility] [facility]  Office  [phone]  CC: Primary care physician; Pcp Not In System

## 2016-02-17 NOTE — Progress Notes (Signed)
Subjective: Patient reports some improvement in his numbness but continues to have paresthesias in the last two fingers on his left hand and on the lateral aspect of his left thigh.    Objective: Current vital signs: BP (!) 128/92 (BP Location: Right Arm)   Pulse 83   Temp 97.5 F (36.4 C) (Oral)   Resp 18   Ht 6\' 8"  (2.032 m)   Wt 90.8 kg (200 lb 4 oz)   SpO2 95%   BMI 22.00 kg/m  Vital signs in last 24 hours: Temp:  [97.5 F (36.4 C)-98.5 F (36.9 C)] 97.5 F (36.4 C) (02/02 0800) Pulse Rate:  [78-97] 83 (02/02 0800) Resp:  [16-20] 18 (02/02 0800) BP: (117-151)/(71-103) 128/92 (02/02 0800) SpO2:  [93 %-99 %] 95 % (02/02 0800) Weight:  [90.8 kg (200 lb 4 oz)] 90.8 kg (200 lb 4 oz) (02/01 1648)  Intake/Output from previous day: 02/01 0701 - 02/02 0700 In: 240 [P.O.:240] Out: 500 [Urine:500] Intake/Output this shift: Total I/O In: 240 [P.O.:240] Out: 600 [Urine:600] Nutritional status: Diet Heart Room service appropriate? Yes; Fluid consistency: Thin  Neurologic Exam: Mental Status: Alert, oriented to name and place but not to month and age.  Speech fluent without evidence of aphasia.  Able to follow 3 step commands without difficulty. Cranial Nerves: II: Discs flat bilaterally; Visual fields grossly normal, pupils equal, round, reactive to light and accommodation III,IV, VI: ptosis not present, extra-ocular motions intact bilaterally V,VII: smile symmetric, facial light touch sensation normal bilaterally VIII: hearing normal bilaterally IX,X: gag reflex present XI: bilateral shoulder shrug XII: midline tongue extension Motor: Right :  Upper extremity   5/5                                      Left:     Upper extremity   5/5             Lower extremity   5/5                                                  Lower extremity   5/5 Tone and bulk:normal tone throughout; no atrophy noted Sensory: Pinprick and light touch decreased in the left hand in the last two digits  and left lateral stump  Lab Results: Basic Metabolic Panel:  Recent Labs Lab 02/16/16 1031  NA 140  K 3.7  CL 107  CO2 26  GLUCOSE 124*  BUN 14  CREATININE 0.92  CALCIUM 8.7*    Liver Function Tests:  Recent Labs Lab 02/16/16 1031  AST 28  ALT 24  ALKPHOS 75  BILITOT 0.6  PROT 7.4  ALBUMIN 3.7   No results for input(s): LIPASE, AMYLASE in the last 168 hours. No results for input(s): AMMONIA in the last 168 hours.  CBC:  Recent Labs Lab 02/16/16 1031  WBC 6.5  NEUTROABS 3.5  HGB 15.7  HCT 46.8  MCV 96.3  PLT 155    Cardiac Enzymes:  Recent Labs Lab 02/16/16 1031  TROPONINI <0.03    Lipid Panel:  Recent Labs Lab 02/17/16 0422  CHOL 88  TRIG 68  HDL 28*  CHOLHDL 3.1  VLDL 14  LDLCALC 46    CBG:  Recent Labs Lab 02/16/16 1037  GLUCAP 121*  Microbiology: No results found for this or any previous visit.  Coagulation Studies:  Recent Labs  02/16/16 1031  LABPROT 12.8  INR 0.96    Imaging: Ct Angio Head W Or Wo Contrast  Result Date: 02/16/2016 CLINICAL DATA:  66 y/o  M; acute stroke. EXAM: CT ANGIOGRAPHY HEAD AND NECK TECHNIQUE: Multidetector CT imaging of the head and neck was performed using the standard protocol during bolus administration of intravenous contrast. Multiplanar CT image reconstructions and MIPs were obtained to evaluate the vascular anatomy. Carotid stenosis measurements (when applicable) are obtained utilizing NASCET criteria, using the distal internal carotid diameter as the denominator. CONTRAST:  75 cc Isovue 370 COMPARISON:  01/04/2016 carotid ultrasound. FINDINGS: CTA NECK FINDINGS Aortic arch: Standard branching. Imaged portion shows no evidence of aneurysm or dissection. No significant stenosis of the major arch vessel origins. Aortic atherosclerosis with mild calcification. Right carotid system: No evidence of dissection, stenosis (50% or greater) or occlusion. Moderate calcified plaque of the bifurcation  without significant stenosis. Left carotid system: No evidence of dissection, stenosis (50% or greater) or occlusion. Moderate calcified plaque of the bifurcation without significant stenosis. Mid upper common carotid artery fibrofatty plaque with mild 30% stenosis. Vertebral arteries: Right dominant. Right vertebral artery origin mixed plaque with mild 40% stenosis. Tortuous left vertebral artery origin with calcified plaque and approximately moderate 50% stenosis. Skeleton: Moderate cervical spondylosis greatest at the C5 through C7 levels with there are prominent discogenic degenerative changes. No high-grade bony canal stenosis. Other neck: Negative. Upper chest: Left AP window enlarged lymph node measuring 10 mm short axis. Small cluster of nodules in the right upper lobe, likely focal bronchiolitis. Review of the MIP images confirms the above findings CTA HEAD FINDINGS Anterior circulation: No significant stenosis, proximal occlusion, aneurysm, or vascular malformation. Moderate calcific atherosclerosis of cavernous and paraclinoid segments with mild paraclinoid stenosis bilaterally. Posterior circulation: No significant stenosis, proximal occlusion, aneurysm, or vascular malformation. Minimal calcific plaque of right V4 segment. Venous sinuses: As permitted by contrast timing, patent. Anatomic variants: Small anterior communicating artery. No posterior communicating artery identified, likely hypoplastic or absent. Review of the MIP images confirms the above findings IMPRESSION: 1. Carotid and vertebral arteries of the neck are patent without dissection or aneurysm. 2. Patent circle of Willis without significant stenosis, proximal occlusion, aneurysm, or vascular malformation. 3. Moderate calcified plaque of bilateral carotid bifurcation without significant stenosis. 4. Fibrofatty plaque of left mid upper common carotid artery with mild 30% stenosis. 5. Right vertebral artery origin mixed plaque with mild 40%  stenosis. 6. Left vertebral artery origin tortuosity and calcified plaque with approximately moderate 50% stenosis. 7. Calcified plaque of bilateral internal carotid arteries with mild paraclinoid segment stenosis. 8. Small cluster of nodules left upper lobe, likely for a colitis. Left aortopulmonary window lymphadenopathy of uncertain significance, probably reactive. Electronically Signed   By: Mitzi HansenLance  Furusawa-Stratton M.D.   On: 02/16/2016 16:46   Ct Angio Neck W Or Wo Contrast  Result Date: 02/16/2016 CLINICAL DATA:  66 y/o  M; acute stroke. EXAM: CT ANGIOGRAPHY HEAD AND NECK TECHNIQUE: Multidetector CT imaging of the head and neck was performed using the standard protocol during bolus administration of intravenous contrast. Multiplanar CT image reconstructions and MIPs were obtained to evaluate the vascular anatomy. Carotid stenosis measurements (when applicable) are obtained utilizing NASCET criteria, using the distal internal carotid diameter as the denominator. CONTRAST:  75 cc Isovue 370 COMPARISON:  01/04/2016 carotid ultrasound. FINDINGS: CTA NECK FINDINGS Aortic arch: Standard branching. Imaged portion  shows no evidence of aneurysm or dissection. No significant stenosis of the major arch vessel origins. Aortic atherosclerosis with mild calcification. Right carotid system: No evidence of dissection, stenosis (50% or greater) or occlusion. Moderate calcified plaque of the bifurcation without significant stenosis. Left carotid system: No evidence of dissection, stenosis (50% or greater) or occlusion. Moderate calcified plaque of the bifurcation without significant stenosis. Mid upper common carotid artery fibrofatty plaque with mild 30% stenosis. Vertebral arteries: Right dominant. Right vertebral artery origin mixed plaque with mild 40% stenosis. Tortuous left vertebral artery origin with calcified plaque and approximately moderate 50% stenosis. Skeleton: Moderate cervical spondylosis greatest at the C5  through C7 levels with there are prominent discogenic degenerative changes. No high-grade bony canal stenosis. Other neck: Negative. Upper chest: Left AP window enlarged lymph node measuring 10 mm short axis. Small cluster of nodules in the right upper lobe, likely focal bronchiolitis. Review of the MIP images confirms the above findings CTA HEAD FINDINGS Anterior circulation: No significant stenosis, proximal occlusion, aneurysm, or vascular malformation. Moderate calcific atherosclerosis of cavernous and paraclinoid segments with mild paraclinoid stenosis bilaterally. Posterior circulation: No significant stenosis, proximal occlusion, aneurysm, or vascular malformation. Minimal calcific plaque of right V4 segment. Venous sinuses: As permitted by contrast timing, patent. Anatomic variants: Small anterior communicating artery. No posterior communicating artery identified, likely hypoplastic or absent. Review of the MIP images confirms the above findings IMPRESSION: 1. Carotid and vertebral arteries of the neck are patent without dissection or aneurysm. 2. Patent circle of Willis without significant stenosis, proximal occlusion, aneurysm, or vascular malformation. 3. Moderate calcified plaque of bilateral carotid bifurcation without significant stenosis. 4. Fibrofatty plaque of left mid upper common carotid artery with mild 30% stenosis. 5. Right vertebral artery origin mixed plaque with mild 40% stenosis. 6. Left vertebral artery origin tortuosity and calcified plaque with approximately moderate 50% stenosis. 7. Calcified plaque of bilateral internal carotid arteries with mild paraclinoid segment stenosis. 8. Small cluster of nodules left upper lobe, likely for a colitis. Left aortopulmonary window lymphadenopathy of uncertain significance, probably reactive. Electronically Signed   By: Mitzi Hansen M.D.   On: 02/16/2016 16:46   Mr Brain Wo Contrast  Result Date: 02/16/2016 CLINICAL DATA:  Left arm  weakness.  History of stroke. EXAM: MRI HEAD WITHOUT CONTRAST TECHNIQUE: Multiplanar, multiecho pulse sequences of the brain and surrounding structures were obtained without intravenous contrast. COMPARISON:  01/05/2016 FINDINGS: Brain: 1 cm focus of restricted diffusion in the anterior genu of the corpus callosum, left para median. DWI hyperintensity in the central pons and right corona radiata noted previously are less intense. Scattered white matter signal abnormalities in the cerebral hemispheres consistent with chronic microvascular disease in this patient with vascular risk factors. A thin confluent rim of gliosis is present around the frontal horns of the lateral ventricles. Normal brain volume for age. No hemorrhage, hydrocephalus, or mass. Vascular: Preserved flow voids Skull and upper cervical spine: Negative Sinuses/Orbits: Mild patchy mucosal thickening, most notable in the right sphenoid sinus where there is retained secretions. IMPRESSION: 1. 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 2. Chronic microvascular disease. Electronically Signed   By: Marnee Spring M.D.   On: 02/16/2016 15:14   Ct Head Code Stroke W/o Cm  Addendum Date: 02/16/2016   ADDENDUM REPORT: 02/16/2016 11:12 ADDENDUM: Study discussed by telephone with Dr. Caryn Bee PADUCHOWSKI on 02/16/2016 at 1101 hours. Electronically Signed   By: Odessa Fleming M.D.   On: 02/16/2016 11:12  Result Date: 02/16/2016 CLINICAL DATA:  Code stroke. 66 year old male with left side numbness since 0900 hours. Initial encounter. EXAM: CT HEAD WITHOUT CONTRAST TECHNIQUE: Contiguous axial images were obtained from the base of the skull through the vertex without intravenous contrast. COMPARISON:  Head CT 02/05/2016 and earlier. FINDINGS: Brain: Patchy right greater than left white matter hypodensity appears stable. Stable gray-white matter differentiation throughout the brain. No midline shift, ventriculomegaly, mass effect, evidence of  mass lesion, intracranial hemorrhage or evidence of cortically based acute infarction. Vascular: Calcified atherosclerosis at the skull base. Skull: No acute osseous abnormality identified. Sinuses/Orbits: Mild mucosal thickening and bubbly opacity in the right sphenoid sinus not significantly changed. Other Visualized paranasal sinuses and mastoids are stable and well pneumatized. Other: Negative orbit and scalp soft tissues. ASPECTS (Alberta Stroke Program Early CT Score) - Ganglionic level infarction (caudate, lentiform nuclei, internal capsule, insula, M1-M3 cortex): 7 - Supraganglionic infarction (M4-M6 cortex): 3 Total score (0-10 with 10 being normal): 10 IMPRESSION: 1. Stable non contrast CT appearance of the brain since 02/05/2016. Chronic white matter changes. 2. ASPECTS is 10. Electronically Signed: By: Odessa Fleming M.D. On: 02/16/2016 10:56    Medications:  I have reviewed the patient's current medications. Scheduled: . aspirin  325 mg Oral Daily  . atorvastatin  40 mg Oral q1800  . clopidogrel  75 mg Oral Daily  . DULoxetine  60 mg Oral Daily  . enoxaparin (LOVENOX) injection  40 mg Subcutaneous Q24H  . gabapentin  100 mg Oral BID  . nicotine  21 mg Transdermal Once    Assessment/Plan: Patient improved but still not at baseline.  MRI of the brain reviewed and shows an acute infarct on the left of the corpus callosum genu.  This is not consistent with the patient's presentation.  Likely secondary to small vessel disease.  Patient on ASA and Plavix.  Carotid dopplers show no evidence of hemodynamically significant stenosis.  Echocardiogram shows no cardiac source of emboli with an EF of 55-60%.  A1c pending, LDL 46 on statin.  Recommendations: 1.  Would continue ASA and Plavix with decrease in ASA to 81mg  2.  Patient to follow up with cardiology for evaluation for need of a holter/LINQ monitor   LOS: 0 days   Thana Farr, MD Neurology [phone] 02/17/2016  11:12 AM

## 2016-02-17 NOTE — Evaluation (Signed)
Occupational Therapy Evaluation Patient Details Name: Maxwell AcresRobert Webb MRN: [id number] DOB: 1950/02/28 Today's Date: 02/17/2016    History of Present Illness Pt. is a 66 y.o. male who was [facility] with a CVA. Pt. PMHx includes: Bilateral BKA, HTN, HLD,and PVD.    Clinical Impression   Pt. Is a 66 y.o. male who [facility] with [facility]. Pt. leftUE strength has progress back to baseline. Grip strength: right: 70#, Left 65#. Pt. continues to present with tingling in the LUE and hand, however, pt. reports this does not interfere with ADL and IADL functioning, and pt. Is able to engage his LUE during self-care tasks. No further OT services are warranted. Will discharge OT services at this time.   Follow Up Recommendations  No OT follow up    Equipment Recommendations       Recommendations for Other Services       Precautions / Restrictions Precautions Precautions: Fall Restrictions Weight Bearing Restrictions: No              ADL Overall ADL's : Needs assistance/impaired                                       General ADL Comments: Pt. is able to use his LUE to engage in ADL/IADLs tasks.     Vision     Perception     Praxis      Pertinent Vitals/Pain Pain Assessment: No/denies pain Pain Score: 0-No pain     Hand Dominance Right   Extremity/Trunk Assessment Upper Extremity Assessment Upper Extremity Assessment: Generalized weakness   Lower Extremity Assessment Lower Extremity Assessment: Overall WFL for tasks assessed   Cervical / Trunk Assessment Cervical / Trunk Assessment: Normal   Communication Communication Communication: No difficulties   Cognition Arousal/Alertness: Awake/alert Behavior During Therapy: WFL for tasks assessed/performed Overall Cognitive Status: Within Functional Limits for tasks assessed                     General Comments       Exercises       Shoulder Instructions      Home Living  Family/patient expects to be discharged to:: Assisted living                             Home Equipment: Wheelchair - power;Other (comment) (Bilateral LE prosthetics.)          Prior Functioning/Environment Level of Independence: Independent with assistive device(s)      Communication / Swallowing Assistance Needed: none          OT Problem List: Decreased strength;Impaired UE functional use   OT Treatment/Interventions:      OT Goals(Current goals can be found in the care plan section) Acute Rehab OT Goals Patient Stated Goal: To return to [facility] OT Goal Formulation: With patient Potential to Achieve Goals: Good  OT Frequency:     Barriers to D/C:            Co-evaluation              End of Session    Activity Tolerance: Patient tolerated treatment well Patient left: in chair;with call bell/phone within reach;with chair alarm set   Time: 1347-1402 OT Time Calculation (min): 15 min Charges:  OT General Charges $OT Visit: 1 Procedure OT Evaluation $  OT Eval Low Complexity: 1 Procedure G-Codes:    Olegario Messier, MS, OTR/L 02/17/2016, 3:05 PM

## 2016-02-17 NOTE — Progress Notes (Signed)
SLP Cancellation Note  Patient Details Name: Maxwell Webb MRN: [id number] DOB: February 02, 1950   Cancelled treatment:       Reason Eval/Treat Not Completed: SLP screened, no needs identified, will sign off (chart reviewed; NSG consulted; Met w/ pt). Pt denied any swallowing or speech-language deficits. Pt had eaten breakfast and NSG reported good toleration of Pills w/ water. Pt conversed w/ SLP at conversational level w/ no apparent deficits noted. Pt stated he was at his baseline.  No further skilled ST services indicated at this time. NSG to reconsult ST services if any change in status while admitted.    Orinda Kenner, MS, CCC-SLP Mendi Constable 02/17/2016, 10:06 AM

## 2016-02-17 NOTE — Discharge Instructions (Signed)
Follow-up with primary care physician in one week [facility] 3 days for outpatient cardiac monitor-LINQ placement Follow-up with neurology Dr. Clelia CroftShaw neurology IN A  month

## 2016-02-17 NOTE — Progress Notes (Addendum)
Patient admitted from [facility]: [facility]F.  Patient is  LTC  Patient medically stable to return to [facility] today.  LCSW has faxed DC summary and discussed plan with facility who is in agreement for him to return. Patient will transport by EMS. Facility reports no FL2 needed at this time as patient is less than 24 hours admitted.  Patient has a legal guardian: DSS.  Call placed to Saintclair HalstedRhesha Carr  [phone] and [phone] in effort to make her aware of discharge and patient status. Messages left on both numbers informing of discharge.  No other needs.  Deretha EmoryHannah Morton Simson LCSW, MSW Clinical Social Work: Optician, dispensingystem Wide [employer] Coverage for :  [phone]

## 2016-02-17 NOTE — Evaluation (Signed)
Physical Therapy Evaluation Patient Details Name: Maxwell Webb MRN: [id number] DOB: February 13, 1950 Today's Date: 02/17/2016   History of Present Illness  Pt is a 66 yo male presented w/ L sided numbness, diagnosed w/ CVA. History of bilat BKA, HTN, HLD, peripheral vascular disease  Clinical Impression  Pt awake, alert, responsive to commands and demonstrated good safety awareness throughout session. Pt able to transfer and take care of himself at baseline using a wc. Overall strength and ROM are WFLs. Sensation on L side is slightly different from R, pt states it feels like a "tickle". Pt requires HOB elevated and use of handrails to move from supine to sitting. Able to sit independently w/ bilat UE support. Pt performed dressing tasks independently. He was able to transfer to chair from bed and from the chair back into bed independently. Pt appears to be functioning at his baseline level and has no need for skilled PT in the hospital and will complete PT orders.     Follow Up Recommendations No PT follow up    Equipment Recommendations       Recommendations for Other Services       Precautions / Restrictions Precautions Precautions: Fall Restrictions Weight Bearing Restrictions: No      Mobility  Bed Mobility Overal bed mobility: Needs Assistance Bed Mobility: Supine to Sit     Supine to sit: HOB elevated     General bed mobility comments: unable to move into sitting posture w/o elevated HOB and use of Bedrails  Transfers Overall transfer level: Independent Equipment used:  (chair)             General transfer comment: Good hand placements and use of UEs, no signs of weakness, transferring at baseline, Bed to chair transfers   Ambulation/Gait                Stairs            Wheelchair Mobility    Modified Rankin (Stroke Patients Only)       Balance Overall balance assessment: Needs assistance Sitting-balance support: Bilateral upper extremity  supported Sitting balance-Leahy Scale: Normal Sitting balance - Comments: able to maintian sitting posture and perform functional dressing        Standing balance comment: not assessed, does not have prosthesis                              Pertinent Vitals/Pain Pain Assessment: No/denies pain    Home Living Family/patient expects to be discharged to:: Assisted living ([facility])               Home Equipment: Wheelchair - power (states wc in police custody)      Prior Function Level of Independence: Independent with assistive device(s)               Hand Dominance   Dominant Hand: Right    Extremity/Trunk Assessment   Upper Extremity Assessment Upper Extremity Assessment: Overall WFL for tasks assessed    Lower Extremity Assessment Lower Extremity Assessment: Overall WFL for tasks assessed    Cervical / Trunk Assessment Cervical / Trunk Assessment: Normal  Communication   Communication: No difficulties  Cognition Arousal/Alertness: Awake/alert Behavior During Therapy: WFL for tasks assessed/performed Overall Cognitive Status: Within Functional Limits for tasks assessed                      General Comments  Exercises     Assessment/Plan    PT Assessment Patent does not need any further PT services  PT Problem List            PT Treatment Interventions      PT Goals (Current goals can be found in the Care Plan section)  Acute Rehab PT Goals Patient Stated Goal: [facility] PT Goal Formulation: With patient Time For Goal Achievement: 03/02/16 Potential to Achieve Goals: Good    Frequency     Barriers to discharge        Co-evaluation               End of Session Equipment Utilized During Treatment: Gait belt Activity Tolerance: Patient tolerated treatment well Patient left: in chair;with call bell/phone within reach;with chair alarm set           Time:  [id number] PT Time Calculation (min) (ACUTE ONLY): 21 min   Charges:         PT G Codes:        Maxwell Webb 02/17/2016, 11:33 AM

## 2016-02-18 ENCOUNTER — Encounter: Payer: Self-pay | Admitting: Emergency Medicine

## 2016-02-18 ENCOUNTER — Emergency Department: Payer: BC Managed Care – PPO

## 2016-02-18 ENCOUNTER — Emergency Department
Admission: EM | Admit: 2016-02-18 | Discharge: 2016-02-18 | Disposition: A | Discharge status: Home / Self Care | Payer: BC Managed Care – PPO | Attending: Emergency Medicine | Admitting: Emergency Medicine

## 2016-02-18 DIAGNOSIS — R200 Anesthesia of skin: Secondary | ICD-10-CM | POA: Diagnosis not present

## 2016-02-18 DIAGNOSIS — Z791 Long term (current) use of non-steroidal anti-inflammatories (NSAID): Secondary | ICD-10-CM | POA: Diagnosis not present

## 2016-02-18 DIAGNOSIS — R51 Headache: Secondary | ICD-10-CM | POA: Insufficient documentation

## 2016-02-18 DIAGNOSIS — F17210 Nicotine dependence, cigarettes, uncomplicated: Secondary | ICD-10-CM | POA: Diagnosis not present

## 2016-02-18 DIAGNOSIS — I10 Essential (primary) hypertension: Secondary | ICD-10-CM | POA: Insufficient documentation

## 2016-02-18 DIAGNOSIS — R519 Headache, unspecified: Secondary | ICD-10-CM

## 2016-02-18 DIAGNOSIS — Z7982 Long term (current) use of aspirin: Secondary | ICD-10-CM | POA: Diagnosis not present

## 2016-02-18 DIAGNOSIS — Z79899 Other long term (current) drug therapy: Secondary | ICD-10-CM | POA: Insufficient documentation

## 2016-02-18 LAB — HEMOGLOBIN A1C
Hgb A1c MFr Bld: 6.2 % — ABNORMAL HIGH (ref 4.8–5.6)
Mean Plasma Glucose: 131 mg/dL

## 2016-02-18 MED ORDER — IBUPROFEN 800 MG PO TABS
800.0000 mg | ORAL_TABLET | Freq: Once | ORAL | Status: AC
  Administered 2016-02-18: 800 mg via ORAL
  Filled 2016-02-18: qty 1

## 2016-02-18 MED ORDER — PROCHLORPERAZINE MALEATE 10 MG PO TABS
10.0000 mg | ORAL_TABLET | Freq: Once | ORAL | Status: AC
  Administered 2016-02-18: 10 mg via ORAL
  Filled 2016-02-18: qty 1

## 2016-02-18 MED ORDER — ACETAMINOPHEN 325 MG PO TABS
650.0000 mg | ORAL_TABLET | Freq: Once | ORAL | Status: DC
  Filled 2016-02-18: qty 2

## 2016-02-18 NOTE — ED Provider Notes (Signed)
[facility] Emergency Department Provider Note  ____________________________________________   I have reviewed the triage vital signs and the nursing notes.   HISTORY  Chief Complaint Headache, left sided numbness  History limited by: Not Limited   HPI Maxwell Webb is a 66 y.o. male who presents to the emergency department today because of concerns for left sided numbness and headache. He states the symptoms started roughly 3 and half hours prior to presentation. He is not sure if the headache started gradually her all of a sudden. Patient was discharged from the hospital yesterday after presentation for left-sided numbness. At that time an MRI was obtained which did show an acute left-sided infarct however neurology did not think that the patient's symptoms and this MRI findings correlated.    Past Medical History:  Diagnosis Date  . Hyperlipemia   . Hypertension   . Korsakoff disease   . Nicotine abuse   . PVD (peripheral vascular disease) [facility](HCC)     Patient Active Problem List   Diagnosis Date Noted  . CVA (cerebral vascular accident) (HCC) 02/16/2016  . TIA (transient ischemic attack) 01/04/2016  . 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 BELOW KNEE LEG AMPUTATION Bilateral     Prior to Admission medications   Medication Sig Start Date End Date Taking? Authorizing Provider  aspirin EC 81 MG EC tablet Take 1 tablet (81 mg total) by mouth daily. 02/18/16   Ramonita LabAruna Gouru, MD  atorvastatin (LIPITOR) 40 MG tablet Take 1 tablet (40 mg total) by mouth daily at 6 PM. Patient taking differently: Take 40 mg by mouth every evening.  01/05/16   Shaune PollackQing Chen, MD  clopidogrel (PLAVIX) 75 MG tablet Take 1 tablet (75 mg total) by mouth daily. 02/18/16   Ramonita LabAruna Gouru, MD  DULoxetine (CYMBALTA) 60 MG capsule Take 60 mg by mouth daily.    Historical  Provider, MD  gabapentin (NEURONTIN) 100 MG capsule Take 100 mg by mouth 2 (two) times daily.    Historical Provider, MD  ibuprofen (ADVIL,MOTRIN) 200 MG tablet Take 200 mg by mouth every 6 (six) hours as needed.    Historical Provider, MD    Allergies Patient has no known allergies.  Family History  Problem Relation Age of Onset  . Hypertension Mother     Social History Social History  Substance Use Topics  . Smoking status: Current Every Day Smoker    Packs/day: 0.50    Years: 20.00    Types: Cigarettes  . Smokeless tobacco: Never Used  . Alcohol use 3.6 oz/week    6 Cans of beer per week     Comment: once a /week    Review of Systems  Constitutional: Negative for fever. Cardiovascular: Negative for chest pain. Respiratory: Negative for shortness of breath. Gastrointestinal: Negative for abdominal pain, vomiting and diarrhea. Neurological: Positive for left sided numbness and headache.  10-point ROS otherwise negative.  ____________________________________________   PHYSICAL EXAM:  VITAL SIGNS: ED Triage Vitals  Enc Vitals Group     BP 125/106     Pulse 92     Resp 20     Temp 98.2     Temp src      SpO2 95   Constitutional: Alert and oriented. Well appearing and in no distress. Eyes: Conjunctivae are normal. Normal extraocular movements. ENT   Head: Normocephalic and atraumatic.   Nose: No  congestion/rhinnorhea.   Mouth/Throat: Mucous membranes are moist.   Neck: No stridor. Hematological/Lymphatic/Immunilogical: No cervical lymphadenopathy. Cardiovascular: Normal rate, regular rhythm.  No murmurs, rubs, or gallops.  Respiratory: Normal respiratory effort without tachypnea nor retractions. Breath sounds are clear and equal bilaterally. No wheezes/rales/rhonchi. Gastrointestinal: Soft and non tender. No rebound. No guarding.  Genitourinary: Deferred Musculoskeletal: s/p bilateral BKA Neurologic:  Normal speech and language. No gross focal  neurologic deficits are appreciated.  Skin:  Skin is warm, dry and intact. No rash noted. Psychiatric: Mood and affect are normal. Speech and behavior are normal. Patient exhibits appropriate insight and judgment.  ____________________________________________    LABS (pertinent positives/negatives)  None  ____________________________________________   EKG  None  ____________________________________________    RADIOLOGY  CT head IMPRESSION:  Chronic atrophic and ischemic changes without acute abnormality.    ___________________________________________   PROCEDURES  Procedures  ____________________________________________   INITIAL IMPRESSION / ASSESSMENT AND PLAN / ED COURSE  Pertinent labs & imaging results that were available during my care of the patient were reviewed by me and considered in my medical decision making (see chart for details).  Patient presented to the emergency department today because of concerns for left sided numbness. Patient recently had an admission was discharged from the hospital yesterday for the same symptoms. At that time he was seen by neurology and had an MRI done. MRI showed an acute infarct on the left side of the brain which neurologist did not think correlated with the patient's symptoms. At this point head CT was performed given the patient was complaining of headache. No evidence of acute bleed. Given recent workup for CVI think patient is safe for discharge. He is on blood thinners. We did try to contact the patient's social work at her number listed [phone]. After multiple times were not able to. I personally also tried the social workers manager's number without any success. This point will discharge back to living facility.  ____________________________________________   FINAL CLINICAL IMPRESSION(S) / ED DIAGNOSES  Final diagnoses:  Nonintractable headache, unspecified chronicity pattern, unspecified headache type   Left sided numbness     Note: This dictation was prepared with Dragon dictation. Any transcriptional errors that result from this process are unintentional     Phineas Semen, MD 02/18/16 1956

## 2016-02-18 NOTE — ED Notes (Signed)
[facility] was called to transport [facility] to [facility] @ 2027.

## 2016-02-18 NOTE — ED Notes (Signed)
[facility] given to [facility] for Mr Crisoforo [facility] being DC'd. No response from Guardian at this time. Will give report to guardian should she call back.

## 2016-02-18 NOTE — Discharge Instructions (Signed)
Please seek medical attention for any high fevers, chest pain, shortness of breath, change in behavior, persistent vomiting, bloody stool or any other new or concerning symptoms.  

## 2016-02-18 NOTE — ED Triage Notes (Signed)
BIB EMS from the [facility] pt requesting staff to call EMS due to left side arm and leg numbness started today around 1pm per pt. Pt also complains of headache that started around the same time. EMS reports [facility] afforded pt medication for headache but pt refused. Pt alert to self and place.

## 2016-02-18 NOTE — ED Notes (Signed)
In to see pt, pt is asking for a cigarette. Pt asked this RN for one and if I could ask the other pt's for a cigarette. Pt is informed by this RN that this is not an option. This RN told pt that this is a smoke free facility and at that point, pt asked this RN for a wheelchair so that he can go outside and smoke and potentially get a cigarette off of a passer buyer. This RN informed pt that until he was discharged from facility with EMS back to [facility].

## 2016-02-18 NOTE — ED Notes (Signed)
E-signature pad not working in room. Pt verbalized consent to DC.  

## 2016-02-18 NOTE — ED Notes (Signed)
Compazine ordered from pharmacy. Pt updated on plan of care.

## 2016-02-18 NOTE — ED Notes (Signed)
MD Derrill KayGoodman attempted to call guardianship worker Saintclair HalstedRhesha Carr x2. Numbers provided for her [phone], [phone], and [phone]. Message left via MD Derrill KayGoodman for call back.

## 2016-02-20 ENCOUNTER — Emergency Department: Payer: BC Managed Care – PPO

## 2016-02-20 ENCOUNTER — Encounter: Payer: Self-pay | Admitting: Intensive Care

## 2016-02-20 ENCOUNTER — Emergency Department
Admission: EM | Admit: 2016-02-20 | Discharge: 2016-02-20 | Disposition: A | Discharge status: Home / Self Care | Payer: BC Managed Care – PPO | Attending: Emergency Medicine | Admitting: Emergency Medicine

## 2016-02-20 DIAGNOSIS — R200 Anesthesia of skin: Secondary | ICD-10-CM | POA: Diagnosis present

## 2016-02-20 DIAGNOSIS — Z7982 Long term (current) use of aspirin: Secondary | ICD-10-CM | POA: Diagnosis not present

## 2016-02-20 DIAGNOSIS — R202 Paresthesia of skin: Secondary | ICD-10-CM

## 2016-02-20 DIAGNOSIS — F17210 Nicotine dependence, cigarettes, uncomplicated: Secondary | ICD-10-CM | POA: Insufficient documentation

## 2016-02-20 DIAGNOSIS — I10 Essential (primary) hypertension: Secondary | ICD-10-CM | POA: Insufficient documentation

## 2016-02-20 DIAGNOSIS — Z79899 Other long term (current) drug therapy: Secondary | ICD-10-CM | POA: Diagnosis not present

## 2016-02-20 LAB — CBC WITH DIFFERENTIAL/PLATELET
BASOS ABS: 0.1 10*3/uL (ref 0–0.1)
BASOS PCT: 1 %
Eosinophils Absolute: 0.6 10*3/uL (ref 0–0.7)
Eosinophils Relative: 7 %
HEMATOCRIT: 45.8 % (ref 40.0–52.0)
HEMOGLOBIN: 15.3 g/dL (ref 13.0–18.0)
Lymphocytes Relative: 21 %
Lymphs Abs: 1.9 10*3/uL (ref 1.0–3.6)
MCH: 31.7 pg (ref 26.0–34.0)
MCHC: 33.4 g/dL (ref 32.0–36.0)
MCV: 94.9 fL (ref 80.0–100.0)
Monocytes Absolute: 1.4 10*3/uL — ABNORMAL HIGH (ref 0.2–1.0)
Monocytes Relative: 15 %
NEUTROS ABS: 5.2 10*3/uL (ref 1.4–6.5)
NEUTROS PCT: 56 %
Platelets: 231 10*3/uL (ref 150–440)
RBC: 4.83 MIL/uL (ref 4.40–5.90)
RDW: 12.9 % (ref 11.5–14.5)
WBC: 9.2 10*3/uL (ref 3.8–10.6)

## 2016-02-20 LAB — URINALYSIS, COMPLETE (UACMP) WITH MICROSCOPIC
BACTERIA UA: NONE SEEN
Bilirubin Urine: NEGATIVE
Glucose, UA: NEGATIVE mg/dL
Hgb urine dipstick: NEGATIVE
Ketones, ur: NEGATIVE mg/dL
Leukocytes, UA: NEGATIVE
Nitrite: NEGATIVE
PH: 6 (ref 5.0–8.0)
PROTEIN: NEGATIVE mg/dL
Specific Gravity, Urine: 1.016 (ref 1.005–1.030)

## 2016-02-20 LAB — COMPREHENSIVE METABOLIC PANEL
ALBUMIN: 3.8 g/dL (ref 3.5–5.0)
ALK PHOS: 78 U/L (ref 38–126)
ALT: 21 U/L (ref 17–63)
AST: 27 U/L (ref 15–41)
Anion gap: 6 (ref 5–15)
BILIRUBIN TOTAL: 0.6 mg/dL (ref 0.3–1.2)
BUN: 12 mg/dL (ref 6–20)
CO2: 23 mmol/L (ref 22–32)
Calcium: 8.7 mg/dL — ABNORMAL LOW (ref 8.9–10.3)
Chloride: 110 mmol/L (ref 101–111)
Creatinine, Ser: 0.87 mg/dL (ref 0.61–1.24)
GFR calc Af Amer: >60 mL/min (ref >60)
GFR calc non Af Amer: >60 mL/min (ref >60)
GLUCOSE: 114 mg/dL — AB (ref 65–99)
POTASSIUM: 3.8 mmol/L (ref 3.5–5.1)
Sodium: 139 mmol/L (ref 135–145)
TOTAL PROTEIN: 7.3 g/dL (ref 6.5–8.1)

## 2016-02-20 MED ORDER — IBUPROFEN 600 MG PO TABS
ORAL_TABLET | ORAL | Status: AC
  Administered 2016-02-20: 600 mg via ORAL
  Filled 2016-02-20: qty 1

## 2016-02-20 MED ORDER — IBUPROFEN 600 MG PO TABS
600.0000 mg | ORAL_TABLET | ORAL | Status: AC
  Administered 2016-02-20: 600 mg via ORAL

## 2016-02-20 NOTE — ED Notes (Signed)
EMS reports patient told Fire Department he was SI. Upon arrival this RN asked patient if he was SI/HI and he denied.

## 2016-02-20 NOTE — ED Provider Notes (Signed)
[facility] Emergency Department Provider Note        Time seen: ----------------------------------------- 10:49 AM on 02/20/2016 -----------------------------------------    I have reviewed the triage vital signs and the nursing notes.   HISTORY  Chief Complaint Numbness    HPI Zymier Rodgers is a 66 y.o. male who presents to the ER for left-sided numbness. Patient states he noticed it when he woke up this morning. He states when he went to bed last night he was not having these issues. He is not sure if he has any weakness in the left side or not. He denies any trouble with speech or swallowing. He denies any recent illness or other complaints.   Past Medical History:  Diagnosis Date  . Hyperlipemia   . Hypertension   . Korsakoff disease   . Nicotine abuse   . PVD (peripheral vascular disease) [facility])     Patient Active Problem List   Diagnosis Date Noted  . CVA (cerebral vascular accident) (HCC) 02/16/2016  . TIA (transient ischemic attack) 01/04/2016  . 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 BELOW KNEE LEG AMPUTATION Bilateral     Allergies Patient has no known allergies.  Social History Social History  Substance Use Topics  . Smoking status: Current Every Day Smoker    Packs/day: 0.50    Years: 20.00    Types: Cigarettes  . Smokeless tobacco: Never Used  . Alcohol use 3.6 oz/week    6 Cans of beer per week     Comment: once a /week    Review of Systems Constitutional: Negative for fever. Cardiovascular: Negative for chest pain. Respiratory: Negative for shortness of breath. Gastrointestinal: Negative for abdominal pain, vomiting and diarrhea. Skin: Negative for rash. Neurological: Negative for headaches, Positive for left-sided paresthesias  10-point ROS otherwise  negative.  ____________________________________________   PHYSICAL EXAM:  VITAL SIGNS: ED Triage Vitals  Enc Vitals Group     BP      Pulse      Resp      Temp      Temp src      SpO2      Weight      Height      Head Circumference      Peak Flow      Pain Score      Pain Loc      Pain Edu?      Excl. in GC?     Constitutional: Alert and oriented. No distress Eyes: Conjunctivae are normal. PERRL. Normal extraocular movements. ENT   Head: Normocephalic and atraumatic.   Nose: No congestion/rhinnorhea.   Mouth/Throat: Mucous membranes are moist.   Neck: No stridor. Cardiovascular: Normal rate, regular rhythm. No murmurs, rubs, or gallops. Respiratory: Normal respiratory effort without tachypnea nor retractions. Breath sounds are clear and equal bilaterally. No wheezes/rales/rhonchi. Gastrointestinal: Soft and nontender. Normal bowel sounds Musculoskeletal: Nontender with normal range of motion in all extremities. Bilateral BKA Neurologic:  Normal speech and language. Strength and cranial nerves appear to be intact. No pronator drift. Cerebellar function appears to be intact. He notes mild paresthesias of left arm and left leg compared to right. Skin:  Skin is warm, dry and intact. No rash noted. Psychiatric: Mood and affect are normal. Speech and behavior are normal.  ____________________________________________  ED COURSE:  Pertinent labs & imaging results that were available  during my care of the patient were reviewed by me and considered in my medical decision making (see chart for details). Patient presents to ER with numbness. We will assess with labs and imaging.   Procedures ____________________________________________   LABS (pertinent positives/negatives)  Labs Reviewed  CBC WITH DIFFERENTIAL/PLATELET - Abnormal; Notable for the following:       Result Value   Monocytes Absolute 1.4 (*)    All other components within normal limits   COMPREHENSIVE METABOLIC PANEL - Abnormal; Notable for the following:    Glucose, Bld 114 (*)    Calcium 8.7 (*)    All other components within normal limits  URINALYSIS, COMPLETE (UACMP) WITH MICROSCOPIC    RADIOLOGY Images were viewed by me  CT head  IMPRESSION: No acute intracranial findings are evident on this noncontrast CT scan. Stable appearance from 02/18/2016.  Previously demonstrated acute infarct of the corpus callosum poorly visualized. ____________________________________________  FINAL ASSESSMENT AND PLAN  Paresthesias  Plan: Patient with labs and imaging as dictated above. Patient is in no acute distress, paresthesias seems subjective. He has been seen twice recently for same with MRI negative for any lesion that would explain his symptoms. He is stable for outpatient follow-up.   Emily FilbertWilliams, Khayden Herzberg E, MD   Note: This note was generated in part or whole with voice recognition software. Voice recognition is usually quite accurate but there are transcription errors that can and very often do occur. I apologize for any typographical errors that were not detected and corrected.     Emily FilbertJonathan E Mekiah Wahler, MD 02/20/16 [phone]

## 2016-02-20 NOTE — ED Notes (Signed)
Patient transported to CT 

## 2016-02-20 NOTE — ED Triage Notes (Signed)
Patient presents [facility] ER by EMS from [facility] for c/o numbness to L arm and L leg that he reports started this AM upon awakening at 0800. Pt c/o slight headache. Speech clear. HX double amputee. A&O x3

## 2016-02-21 ENCOUNTER — Emergency Department
Admission: EM | Admit: 2016-02-21 | Discharge: 2016-02-21 | Disposition: A | Discharge status: Home / Self Care | Payer: BC Managed Care – PPO | Attending: Emergency Medicine | Admitting: Emergency Medicine

## 2016-02-21 ENCOUNTER — Encounter: Payer: Self-pay | Admitting: Emergency Medicine

## 2016-02-21 DIAGNOSIS — Z79899 Other long term (current) drug therapy: Secondary | ICD-10-CM | POA: Insufficient documentation

## 2016-02-21 DIAGNOSIS — F17210 Nicotine dependence, cigarettes, uncomplicated: Secondary | ICD-10-CM | POA: Insufficient documentation

## 2016-02-21 DIAGNOSIS — Z7982 Long term (current) use of aspirin: Secondary | ICD-10-CM | POA: Insufficient documentation

## 2016-02-21 DIAGNOSIS — Z0000 Encounter for general adult medical examination without abnormal findings: Secondary | ICD-10-CM | POA: Diagnosis not present

## 2016-02-21 DIAGNOSIS — Z139 Encounter for screening, unspecified: Secondary | ICD-10-CM

## 2016-02-21 DIAGNOSIS — Z046 Encounter for general psychiatric examination, requested by authority: Secondary | ICD-10-CM | POA: Diagnosis present

## 2016-02-21 DIAGNOSIS — Z791 Long term (current) use of non-steroidal anti-inflammatories (NSAID): Secondary | ICD-10-CM | POA: Diagnosis not present

## 2016-02-21 DIAGNOSIS — I10 Essential (primary) hypertension: Secondary | ICD-10-CM | POA: Insufficient documentation

## 2016-02-21 LAB — COMPREHENSIVE METABOLIC PANEL
ALK PHOS: 86 U/L (ref 38–126)
ALT: 23 U/L (ref 17–63)
AST: 28 U/L (ref 15–41)
Albumin: 4.3 g/dL (ref 3.5–5.0)
Anion gap: 9 (ref 5–15)
BILIRUBIN TOTAL: 0.7 mg/dL (ref 0.3–1.2)
BUN: 15 mg/dL (ref 6–20)
CALCIUM: 9.4 mg/dL (ref 8.9–10.3)
CHLORIDE: 106 mmol/L (ref 101–111)
CO2: 23 mmol/L (ref 22–32)
CREATININE: 0.85 mg/dL (ref 0.61–1.24)
Glucose, Bld: 116 mg/dL — ABNORMAL HIGH (ref 65–99)
Potassium: 4 mmol/L (ref 3.5–5.1)
Sodium: 138 mmol/L (ref 135–145)
Total Protein: 8.4 g/dL — ABNORMAL HIGH (ref 6.5–8.1)

## 2016-02-21 LAB — CBC
HCT: 50.6 % (ref 40.0–52.0)
Hemoglobin: 16.6 g/dL (ref 13.0–18.0)
MCH: 31.7 pg (ref 26.0–34.0)
MCHC: 32.8 g/dL (ref 32.0–36.0)
MCV: 96.7 fL (ref 80.0–100.0)
PLATELETS: 263 10*3/uL (ref 150–440)
RBC: 5.23 MIL/uL (ref 4.40–5.90)
RDW: 13.3 % (ref 11.5–14.5)
WBC: 8.1 10*3/uL (ref 3.8–10.6)

## 2016-02-21 LAB — ACETAMINOPHEN LEVEL: Acetaminophen (Tylenol), Serum: <10 ug/mL — ABNORMAL LOW (ref 10–30)

## 2016-02-21 LAB — SALICYLATE LEVEL: Salicylate Lvl: <7 mg/dL (ref 2.8–30.0)

## 2016-02-21 LAB — ETHANOL

## 2016-02-21 NOTE — ED Provider Notes (Signed)
[facility] Emergency Department Provider Note  ____________________________________________   First MD Initiated Contact with Patient 02/21/16 2001     (approximate)  I have reviewed the triage vital signs and the nursing notes.   HISTORY  Chief Complaint Psychiatric Evaluation   HPI Cynda AcresRobert Olmo is a 66 y.o. male with a history of hypertension as well as Korsakoff disease was presenting to the emergency department tonight without any complaints. [facility] [facility] who is transferred here forpossibly expressing thoughts of hurting himself earlier. He said in triage "things aren't going away." However, with me he is denying any suicidal or homicidal used them. Denies any depression. Denies any thoughts of sadness or feelings of sadness.   Past Medical History:  Diagnosis Date  . Hyperlipemia   . Hypertension   . Korsakoff disease   . Nicotine abuse   . PVD (peripheral vascular disease) [facility](HCC)     Patient Active Problem List   Diagnosis Date Noted  . CVA (cerebral vascular accident) (HCC) 02/16/2016  . TIA (transient ischemic attack) 01/04/2016  . 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 BELOW KNEE LEG AMPUTATION Bilateral     Prior to Admission medications   Medication Sig Start Date End Date Taking? Authorizing Provider  aspirin EC 81 MG EC tablet Take 1 tablet (81 mg total) by mouth daily. 02/18/16   Ramonita LabAruna Gouru, MD  atorvastatin (LIPITOR) 40 MG tablet Take 1 tablet (40 mg total) by mouth daily at 6 PM. Patient taking differently: Take 40 mg by mouth every evening.  01/05/16   Shaune PollackQing Chen, MD  clopidogrel (PLAVIX) 75 MG tablet Take 1 tablet (75 mg total) by mouth daily. 02/18/16   Ramonita LabAruna Gouru, MD  DULoxetine (CYMBALTA) 60 MG capsule Take 60 mg by mouth daily.    Historical Provider, MD  gabapentin (NEURONTIN) 100  MG capsule Take 100 mg by mouth 2 (two) times daily.    Historical Provider, MD  ibuprofen (ADVIL,MOTRIN) 200 MG tablet Take 200 mg by mouth every 6 (six) hours as needed.    Historical Provider, MD    Allergies Patient has no known allergies.  Family History  Problem Relation Age of Onset  . Hypertension Mother     Social History Social History  Substance Use Topics  . Smoking status: Current Every Day Smoker    Packs/day: 0.50    Years: 20.00    Types: Cigarettes  . Smokeless tobacco: Never Used  . Alcohol use 3.6 oz/week    6 Cans of beer per week     Comment: once a /week    Review of Systems Constitutional: No fever/chills Eyes: No visual changes. ENT: No sore throat. Cardiovascular: Denies chest pain. Respiratory: Denies shortness of breath. Gastrointestinal: No abdominal pain.  No nausea, no vomiting.  No diarrhea.  No constipation. Genitourinary: Negative for dysuria. Musculoskeletal: Negative for back pain. Skin: Negative for rash. Neurological: Negative for headaches, focal weakness or numbness.  10-point ROS otherwise negative.  ____________________________________________   PHYSICAL EXAM:  VITAL SIGNS: ED Triage Vitals  Enc Vitals Group     BP 02/21/16 1925 (!) 120/93     Pulse Rate 02/21/16 1925 89     Resp 02/21/16 1925 18     Temp 02/21/16 1925 98.4 F (36.9 C)     Temp Source 02/21/16 1925 Oral  SpO2 02/21/16 1925 98 %     Weight 02/21/16 1926 220 lb (99.8 kg)     Height 02/21/16 1926 6\' 1"  (1.854 m)     Head Circumference --      Peak Flow --      Pain Score 02/21/16 1927 7     Pain Loc --      Pain Edu? --      Excl. in GC? --    Constitutional: Alert and oriented. Well appearing and in no acute distress. Eyes: Conjunctivae are normal. PERRL. EOMI. Head: Atraumatic. Nose: No congestion/rhinnorhea. Mouth/Throat: Mucous membranes are moist.   Neck: No stridor.   Cardiovascular: Normal rate, regular rhythm. Grossly normal heart  sounds. Respiratory: Normal respiratory effort.  No retractions. Lungs CTAB. Gastrointestinal: Soft and nontender. No distention.  Musculoskeletal: No lower extremity tenderness nor edema. Bilateral lower extremity and complications with the stumps are clean dry and intact. Neurologic:  Normal speech and language. No gross focal neurologic deficits are appreciated. No gait instability. Skin:  Skin is warm, dry and intact. No rash noted. Psychiatric: Mood and affect are normal. Speech and behavior are normal.  ____________________________________________   LABS (all labs ordered are listed, but only abnormal results are displayed)  Labs Reviewed  COMPREHENSIVE METABOLIC PANEL - Abnormal; Notable for the following:       Result Value   Glucose, Bld 116 (*)    Total Protein 8.4 (*)    All other components within normal limits  ACETAMINOPHEN LEVEL - Abnormal; Notable for the following:    Acetaminophen (Tylenol), Serum <10 (*)    All other components within normal limits  ETHANOL  SALICYLATE LEVEL  CBC  URINE DRUG SCREEN, QUALITATIVE ([facility] ONLY)   ____________________________________________  EKG   ____________________________________________  RADIOLOGY   ____________________________________________   PROCEDURES  Procedure(s) performed:   Procedures  Critical Care performed:   ____________________________________________   INITIAL IMPRESSION / ASSESSMENT AND PLAN / ED COURSE  Pertinent labs & imaging results that were available during my care of the patient were reviewed by me and considered in my medical decision making (see chart for details).  ----------------------------------------- 10:34 PM on 02/21/2016 -----------------------------------------  Patient resting comfortably at this time. Still is calm and cooperative. Patient complained of the psychiatrist about possible numbness in his left arm and leg earlier today. However, he never had these  complaints with me and is now neurologically intact. However, Dr. Annitta Jersey is recommending possible follow-up with neurology for complex partial seizure. I discussed this with the patient who is amenable to this plan.      ____________________________________________   FINAL CLINICAL IMPRESSION(S) / ED DIAGNOSES  Medical screening exam.    NEW MEDICATIONS STARTED DURING THIS VISIT:  New Prescriptions   No medications on file     Note:  This document was prepared using Dragon voice recognition software and may include unintentional dictation errors.     Myrna Blazer, MD 02/21/16 [id number]

## 2016-02-21 NOTE — ED Notes (Signed)
Attempted to call [facility] @ [phone] (no answer) and [phone] (mailbox full).

## 2016-02-21 NOTE — ED Triage Notes (Signed)
[facility], when asked why pt was here pt reports "I don't know, I may have expressed thoughts of hurting myself earlier." Pt states "I just seem to be having a hard time, things aren't going my way." Pt denies having a plan, denies feeling suicidal at this time. Pt alert, calm and cooperative. Denies HI and AVH.

## 2016-02-21 NOTE — ED Notes (Signed)
[facility]  SOC machine set up in room.

## 2016-02-21 NOTE — ED Notes (Signed)
Pt. States he is here from [facility]  Pt. Is unsure why he is here.  Pt. [facility] the [facility].  Pt. States he likes the food and personal at [facility].

## 2016-02-21 NOTE — ED Notes (Signed)
Pt given sandwich tray and sprite.  

## 2016-02-27 ENCOUNTER — Emergency Department
Admission: EM | Admit: 2016-02-27 | Discharge: 2016-02-27 | Discharge status: Against Medical Advice | Payer: BC Managed Care – PPO | Attending: Emergency Medicine | Admitting: Emergency Medicine

## 2016-02-27 ENCOUNTER — Emergency Department: Payer: BC Managed Care – PPO

## 2016-02-27 ENCOUNTER — Encounter: Payer: Self-pay | Admitting: Emergency Medicine

## 2016-02-27 DIAGNOSIS — I10 Essential (primary) hypertension: Secondary | ICD-10-CM | POA: Diagnosis not present

## 2016-02-27 DIAGNOSIS — Z79899 Other long term (current) drug therapy: Secondary | ICD-10-CM | POA: Diagnosis not present

## 2016-02-27 DIAGNOSIS — R531 Weakness: Secondary | ICD-10-CM | POA: Insufficient documentation

## 2016-02-27 DIAGNOSIS — R202 Paresthesia of skin: Secondary | ICD-10-CM | POA: Insufficient documentation

## 2016-02-27 DIAGNOSIS — Z5181 Encounter for therapeutic drug level monitoring: Secondary | ICD-10-CM | POA: Diagnosis not present

## 2016-02-27 DIAGNOSIS — F17210 Nicotine dependence, cigarettes, uncomplicated: Secondary | ICD-10-CM | POA: Insufficient documentation

## 2016-02-27 DIAGNOSIS — G451 Carotid artery syndrome (hemispheric): Secondary | ICD-10-CM

## 2016-02-27 DIAGNOSIS — Z7982 Long term (current) use of aspirin: Secondary | ICD-10-CM | POA: Insufficient documentation

## 2016-02-27 LAB — TROPONIN I: Troponin I: <0.03 ng/mL (ref <0.03)

## 2016-02-27 LAB — PROTIME-INR
INR: 0.99
Prothrombin Time: 13.1 seconds (ref 11.4–15.2)

## 2016-02-27 LAB — CBC
HCT: 45.8 % (ref 40.0–52.0)
HEMOGLOBIN: 15.7 g/dL (ref 13.0–18.0)
MCH: 32.8 pg (ref 26.0–34.0)
MCHC: 34.2 g/dL (ref 32.0–36.0)
MCV: 95.8 fL (ref 80.0–100.0)
Platelets: 228 10*3/uL (ref 150–440)
RBC: 4.78 MIL/uL (ref 4.40–5.90)
RDW: 13.2 % (ref 11.5–14.5)
WBC: 9.2 10*3/uL (ref 3.8–10.6)

## 2016-02-27 LAB — DIFFERENTIAL
BASOS ABS: 0.1 10*3/uL (ref 0–0.1)
Basophils Relative: 1 %
EOS ABS: 0.5 10*3/uL (ref 0–0.7)
Eosinophils Relative: 5 %
LYMPHS ABS: 1.7 10*3/uL (ref 1.0–3.6)
LYMPHS PCT: 19 %
Monocytes Absolute: 0.9 10*3/uL (ref 0.2–1.0)
Monocytes Relative: 10 %
NEUTROS PCT: 65 %
Neutro Abs: 6 10*3/uL (ref 1.4–6.5)

## 2016-02-27 LAB — COMPREHENSIVE METABOLIC PANEL
ALBUMIN: 3.6 g/dL (ref 3.5–5.0)
ALT: 23 U/L (ref 17–63)
AST: 26 U/L (ref 15–41)
Alkaline Phosphatase: 71 U/L (ref 38–126)
Anion gap: 7 (ref 5–15)
BILIRUBIN TOTAL: 0.5 mg/dL (ref 0.3–1.2)
BUN: 21 mg/dL — AB (ref 6–20)
CO2: 22 mmol/L (ref 22–32)
Calcium: 8.6 mg/dL — ABNORMAL LOW (ref 8.9–10.3)
Chloride: 109 mmol/L (ref 101–111)
Creatinine, Ser: 0.87 mg/dL (ref 0.61–1.24)
GFR calc Af Amer: >60 mL/min (ref >60)
GFR calc non Af Amer: >60 mL/min (ref >60)
Glucose, Bld: 112 mg/dL — ABNORMAL HIGH (ref 65–99)
POTASSIUM: 3.6 mmol/L (ref 3.5–5.1)
Sodium: 138 mmol/L (ref 135–145)
TOTAL PROTEIN: 7.2 g/dL (ref 6.5–8.1)

## 2016-02-27 LAB — APTT: APTT: 33 s (ref 24–36)

## 2016-02-27 MED ORDER — IBUPROFEN 600 MG PO TABS
600.0000 mg | ORAL_TABLET | Freq: Once | ORAL | Status: AC
  Administered 2016-02-27: 600 mg via ORAL
  Filled 2016-02-27: qty 1

## 2016-02-27 NOTE — ED Triage Notes (Addendum)
Pt to ED via EMS from home, states around 1330 pt noticed left side numbness. Pt states headache. Per EMS NIH was neg. PT A&Ox4. Pt is bilat amputee to both lower extremeties.

## 2016-02-27 NOTE — Discharge Instructions (Signed)
As we have discussed, please follow up with your neurologist (Dr. Sherryll BurgerShah) as soon as possible regarding today?s Emergency Department (ED) visit and your headache symptoms.    Call your doctor or return to the ED if you have a worsening headache, sudden and severe headache, confusion, slurred speech, facial droop, weakness or numbness in any arm or leg, extreme fatigue, vision problems, or other symptoms that concern you.  Continue your current medications.

## 2016-02-27 NOTE — Consult Note (Signed)
Reason for Consult:L sided numbness  Referring Physician: Dr. York Webb  CC: L sided numbness   HPI: Maxwell Webb is an 66 y.o. male  history of stroke who presents with complaints of left sided numbness. Similar complaints from 02/16/16 and was seen by Dr. Thad Webb.  Symptoms improved and minimal numbness LUE  Past Medical History:  Diagnosis Date  . Hyperlipemia   . Hypertension   . Korsakoff disease   . Nicotine abuse   . PVD (peripheral vascular disease) (HCC)     Past Surgical History:  Procedure Laterality Date  . BELOW KNEE LEG AMPUTATION Bilateral     Family History  Problem Relation Age of Onset  . Hypertension Mother     Social History:  reports that he has been smoking Cigarettes.  He has a 10.00 pack-year smoking history. He has never used smokeless tobacco. He reports that he drinks about 3.6 oz of alcohol per week . He reports that he uses drugs, including Marijuana.  No Known Allergies  Medications: I have reviewed the patient's current medications.  ROS: History obtained from the patient  General ROS: negative for - chills, fatigue, fever, night sweats, weight gain or weight loss Psychological ROS: negative for - behavioral disorder, hallucinations, memory difficulties, mood swings or suicidal ideation Ophthalmic ROS: negative for - blurry vision, double vision, eye pain or loss of vision ENT ROS: negative for - epistaxis, nasal discharge, oral lesions, sore throat, tinnitus or vertigo Allergy and Immunology ROS: negative for - hives or itchy/watery eyes Hematological and Lymphatic ROS: negative for - bleeding problems, bruising or swollen lymph nodes Endocrine ROS: negative for - galactorrhea, hair pattern changes, polydipsia/polyuria or temperature intolerance Respiratory ROS: negative for - cough, hemoptysis, shortness of breath or wheezing Cardiovascular ROS: negative for - chest pain, dyspnea on exertion, edema or irregular heartbeat Gastrointestinal ROS:  negative for - abdominal pain, diarrhea, hematemesis, nausea/vomiting or stool incontinence Genito-Urinary ROS: negative for - dysuria, hematuria, incontinence or urinary frequency/urgency Musculoskeletal ROS: negative for - joint swelling or muscular weakness Neurological ROS: as noted in HPI Dermatological ROS: negative for rash and skin lesion changes  Physical Examination: Blood pressure (!) 120/92, pulse 94, temperature 98.7 F (37.1 C), temperature source Oral, resp. rate 16, SpO2 96 %.  Neurological Examination   Mental Status: Alert, oriented, thought content appropriate.  Speech fluent without evidence of aphasia.  Able to follow 3 step commands without difficulty. Cranial Nerves: II: Discs flat bilaterally; Visual fields grossly normal, pupils equal, round, reactive to light and accommodation III,IV, VI: ptosis not present, extra-ocular motions intact bilaterally V,VII: smile symmetric, facial light touch sensation normal bilaterally VIII: hearing normal bilaterally IX,X: gag reflex present XI: bilateral shoulder shrug XII: midline tongue extension Motor:  B/l BKA Right : Upper extremity   5/5    Left:     Upper extremity   5/5  Lower extremity   5/5     Lower extremity   5/5 Tone and bulk:normal tone throughout; no atrophy noted Sensory: decreased sensation LUE Deep Tendon Reflexes: 1+ and symmetric throughout Plantars: Right: downgoing   Left: downgoing Cerebellar: normal finger-to-nose, normal rapid alternating movements and normal heel-to-shin test Gait: not tested     Laboratory Studies:   Basic Metabolic Panel:  Recent Labs Lab 02/21/16 1938 02/27/16 1342  NA 138 138  K 4.0 3.6  CL 106 109  CO2 23 22  GLUCOSE 116* 112*  BUN 15 21*  CREATININE 0.85 0.87  CALCIUM 9.4 8.6*  Liver Function Tests:  Recent Labs Lab 02/21/16 1938 02/27/16 1342  AST 28 26  ALT 23 23  ALKPHOS 86 71  BILITOT 0.7 0.5  PROT 8.4* 7.2  ALBUMIN 4.3 3.6   No results  for input(s): LIPASE, AMYLASE in the last 168 hours. No results for input(s): AMMONIA in the last 168 hours.  CBC:  Recent Labs Lab 02/21/16 1938 02/27/16 1342  WBC 8.1 9.2  NEUTROABS  --  6.0  HGB 16.6 15.7  HCT 50.6 45.8  MCV 96.7 95.8  PLT 263 228    Cardiac Enzymes:  Recent Labs Lab 02/27/16 1342  TROPONINI <0.03    BNP: Invalid input(s): POCBNP  CBG: No results for input(s): GLUCAP in the last 168 hours.  Microbiology: No results found for this or any previous visit.  Coagulation Studies:  Recent Labs  02/27/16 1342  LABPROT 13.1  INR 0.99    Urinalysis: No results for input(s): COLORURINE, LABSPEC, PHURINE, GLUCOSEU, HGBUR, BILIRUBINUR, KETONESUR, PROTEINUR, UROBILINOGEN, NITRITE, LEUKOCYTESUR in the last 168 hours.  Invalid input(s): APPERANCEUR  Lipid Panel:     Component Value Date/Time   CHOL 88 02/17/2016 0422   TRIG 68 02/17/2016 0422   HDL 28 (L) 02/17/2016 0422   CHOLHDL 3.1 02/17/2016 0422   VLDL 14 02/17/2016 0422   LDLCALC 46 02/17/2016 0422    HgbA1C:  Lab Results  Component Value Date   HGBA1C 6.2 (H) 02/17/2016    Urine Drug Screen:     Component Value Date/Time   LABOPIA NONE DETECTED 12/16/2015 1615   COCAINSCRNUR NONE DETECTED 12/16/2015 1615   LABBENZ NONE DETECTED 12/16/2015 1615   AMPHETMU NONE DETECTED 12/16/2015 1615   THCU NONE DETECTED 12/16/2015 1615   LABBARB NONE DETECTED 12/16/2015 1615    Alcohol Level:  Recent Labs Lab 02/21/16 1938  ETH <5     Imaging: No results found.   Assessment/Plan:  66 y.o. male  history of stroke who presents with complaints of left sided numbness. Similar complaints from 02/16/16 and was seen by Dr. Thad Rangereynolds.  Symptoms improved and minimal numbness LUE MRI from 2/1 L corpus callosum infarct.   - Con't home antiplatelet therapy - MRI brain. If negative can be d/c as full work up done 11 days ago.  Maxwell Webb, Maxwell Webb   02/27/2016, 3:33 PM

## 2016-02-27 NOTE — ED Notes (Signed)
Pt states he is ready to go home and does not want the MRI. MD at bedside

## 2016-02-27 NOTE — ED Notes (Signed)
Patient transported to MRI 

## 2016-02-27 NOTE — Progress Notes (Signed)
   02/27/16 1400  Clinical Encounter Type  Visited With Patient  Visit Type Code (This was a Code Stroke at 1:45pm)  Referral From Nurse  Consult/Referral To Chaplain  Spiritual Encounters  Spiritual Needs (Patient expressed some anxiety and a need to talk)  Stress Factors  Patient Stress Factors Family relationships;Health changes;Loss of control  Family Stress Factors (No family present during visit with patient)  I was paged twice at 1:40 and 1:45 for a medical alert and then a code stroke. I called to assess and responded in person to make contact with patient and/or family members. I found the patient in the hallway receiving nursing care. He was alone with no family present. I introduced myself as the chaplain and told the patient I was a part of his care team. The patient was cordial and in the mood to talk. I listened as he shared some of his family history, speaking of how he grew up and that his parents were both dead. I asked if he had any family other than his parents. He has an ex-wife and two daughters, one of whom lives in [place]. When I asked if there was anyone coming to the hospital or anyone he needed us to contact, he declined. When I inquired what helps him cope in life, he spoke of his faith in God and that reading and studying the Bible at the convalescent home where he lives helps sustain his faith. He did not speak of friends or other activities that help him cope. He spoke of a former pastor that he respected for his thoughtful sermons.  He did not ask for prayer when I asked him if there was anything I could do for him. He thanked me several times for listening to him and for what I do. I let him know how he could request a chaplain.   My assessment is that this gentleman is anxious about what is happening to him physically. He also presents as someone who is lonely and responds well to conversation and concern from others.   The doctor came by and wants to keep him at  least one more day. If followed by chaplaincy staff, further exploration of social and familial connections would be important, as well as exploring where the patient finds hope and meaning in life at this time. These areas of exploration may aid the patient in discovering more resiliency in his current life situation. Pepco HoldingsChaplain Franklin [phone]

## 2016-02-27 NOTE — ED Notes (Signed)
Per Graylon GoodQuashima nurse at the [facility] will be sending transportation for pt

## 2016-02-27 NOTE — ED Provider Notes (Addendum)
[facility] Emergency Department Provider Note   ____________________________________________   First MD Initiated Contact with Patient 02/27/16 1335     (approximate)  I have reviewed the triage vital signs and the nursing notes.   HISTORY  Chief Complaint Numbness    HPI [name] is a 66 y.o. male reports that sometime between about 10 and 11 AM while sedated at this wheelchair he noted a decrease tingling type sensation in his left hand, left lower leg as well as mild weakness in the left upper arm. He slight tingling over the left side of the face and jaw region also. No trouble speaking. No nausea or vomiting. Reports a mild throbbing headache.  Denies any trouble speaking. No shortness of breath or chest pain.  He does report he had a recent stroke. Is not sure when. He denies any neck pain. No fevers or chills.  Past Medical History:  Diagnosis Date  . Hyperlipemia   . Hypertension   . Korsakoff disease   . Nicotine abuse   . PVD (peripheral vascular disease) [facility])     Patient Active Problem List   Diagnosis Date Noted  . CVA (cerebral vascular accident) (HCC) 02/16/2016  . TIA (transient ischemic attack) 01/04/2016  . 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 BELOW KNEE LEG AMPUTATION Bilateral     Prior to Admission medications   Medication Sig Start Date End Date Taking? Authorizing Provider  aspirin EC 81 MG EC tablet Take 1 tablet (81 mg total) by mouth daily. 02/18/16  Yes Ramonita Lab, MD  atorvastatin (LIPITOR) 40 MG tablet Take 1 tablet (40 mg total) by mouth daily at 6 PM. Patient taking differently: Take 40 mg by mouth every evening.  01/05/16  Yes Shaune Pollack, MD  butalbital-acetaminophen-caffeine (FIORICET, ESGIC) [id number] MG tablet Take 1 tablet by mouth every 8 (eight) hours as needed for  headache.   Yes Historical Provider, MD  clopidogrel (PLAVIX) 75 MG tablet Take 1 tablet (75 mg total) by mouth daily. 02/18/16  Yes Ramonita Lab, MD  DULoxetine (CYMBALTA) 60 MG capsule Take 60 mg by mouth daily.   Yes Historical Provider, MD  gabapentin (NEURONTIN) 100 MG capsule Take 100 mg by mouth 2 (two) times daily.   Yes Historical Provider, MD  ibuprofen (ADVIL,MOTRIN) 200 MG tablet Take 200 mg by mouth every 6 (six) hours as needed.   Yes Historical Provider, MD    Allergies Patient has no known allergies.  Family History  Problem Relation Age of Onset  . Hypertension Mother     Social History Social History  Substance Use Topics  . Smoking status: Current Every Day Smoker    Packs/day: 0.50    Years: 20.00    Types: Cigarettes  . Smokeless tobacco: Never Used  . Alcohol use 3.6 oz/week    6 Cans of beer per week     Comment: once a /week    Review of Systems Constitutional: No fever/chills Eyes: No visual changes. ENT: No sore throat. Cardiovascular: Denies chest pain. Respiratory: Denies shortness of breath. Gastrointestinal: No abdominal pain.  No nausea, no vomiting.  No diarrhea.  No constipation. Genitourinary: Negative for dysuria. Musculoskeletal: Negative for back pain. Skin: Negative for rash. Neurological: See history of present illness  10-point ROS otherwise negative.  ____________________________________________   PHYSICAL EXAM:  VITAL SIGNS: ED Triage Vitals [02/27/16 1333]  Enc Vitals Group     BP (!) 120/92     Pulse Rate 94     Resp 16     Temp 98.7 F (37.1 C)     Temp Source Oral     SpO2 96 %     Weight      Height      Head Circumference      Peak Flow      Pain Score      Pain Loc      Pain Edu?      Excl. in GC?     Constitutional: Alert and oriented. Well appearing and in no acute distress. Eyes: Conjunctivae are normal. PERRL. EOMI. Head: Atraumatic. Nose: No congestion/rhinnorhea. Mouth/Throat: Mucous membranes  are moist.  Oropharynx non-erythematous. Neck: No stridor.  No midline cervical tenderness Cardiovascular: Normal rate, regular rhythm. Grossly normal heart sounds.  Good peripheral circulation. Respiratory: Normal respiratory effort.  No retractions. Lungs CTAB. Gastrointestinal: Soft and nontender. No distention.  Musculoskeletal: No lower extremity tenderness with old below the knee of patient's bilateral Neurologic:  Normal speech and language.   NIH score equals 2, performed by me at bedside. The patient has no pronator drift. The patient has normal cranial nerve exam. Extraocular movements are normal. Visual fields are normal. Patient has 5 out of 5 strength in all extremities except the left arm has a slight decrease in grip strength and flexion and extension compared to the right, but still at least 4 or 5 strength. There is no numbness or gross, acute sensory abnormality in the extremities except the left arm, left leg he reports decreased light touch sensation. No speech disturbance. No dysarthria. No aphasia. No ataxia. Normal finger nose finger bilat. Patient speaking in full and clear sentences.   Skin:  Skin is warm, dry and intact. No rash noted. Psychiatric: Mood and affect are normal. Speech and behavior are normal.  ____________________________________________   LABS (all labs ordered are listed, but only abnormal results are displayed)  Labs Reviewed  COMPREHENSIVE METABOLIC PANEL - Abnormal; Notable for the following:       Result Value   Glucose, Bld 112 (*)    BUN 21 (*)    Calcium 8.6 (*)    All other components within normal limits  PROTIME-INR  APTT  CBC  DIFFERENTIAL  TROPONIN I  ETHANOL  URINALYSIS, ROUTINE W REFLEX MICROSCOPIC   ____________________________________________  EKG  Reviewed injury by me at 1400 Ventricular rate 90 QRS 130 QTc 430 Normal sinus rhythm, right bundle branch block without ischemic abnormality  noted ____________________________________________  RADIOLOGY  MRI PENDING  ____________________________________________   PROCEDURES  Procedure(s) performed: None  Procedures  Critical Care performed: Yes, see critical care note(s)  CRITICAL CARE Performed by: Sharyn Creamer   Total critical care time: 38 minutes  Critical care time was exclusive of separately billable procedures and treating other patients.  Critical care was necessary to treat or prevent imminent or life-threatening deterioration.  Critical care was time spent personally by me on the following activities: development of treatment plan with patient and/or surrogate as well as nursing, discussions with consultants, evaluation of patient's response to treatment, examination of patient, obtaining history from patient or surrogate, ordering and performing treatments and interventions, ordering and review of laboratory studies, ordering and review of radiographic studies, pulse oximetry and re-evaluation of patient's condition.  Patient seen within the TPA window for acute neurologic deficit including reported new weakness of the left arm, though he  had a recent ischemic stroke evidently on record review. He had left-sided numbness reported at that time, but now also reports mild weakness in the left upper extremity. It is somewhat unclear to me if this truly represents an acute or new stroke, versus possible sequela of his old. I spoke with Dr. Daryl Eastern, our neurologist and he advised me the patient is not a TPA candidate given his recent stroke within the last 30 days. He does however recommend antiplatelet therapy, admission to the hospital and full workup given the associated symptomatology. ____________________________________________   INITIAL IMPRESSION / ASSESSMENT AND PLAN / ED COURSE  Pertinent labs & imaging results that were available during my care of the patient were reviewed by me and considered in my  medical decision making (see chart for details).    Clinical Course as of Feb 26 1534  Mon Feb 27, 2016  1420 Dr. Loretha Brasil at bedside at present time.  [MQ]  1427 Dr. Loretha Brasil has seen and evaluated the patient. He recommended at this point, to discontinue CT imaging and instead obtain noncontrast MRI. If the MRI does not show an acute infarct, he would recommend discharge to continue current therapy after seeing the patient reviewing his previous workup and imaging.  [MQ]  1526 Assuming care from Dr. Fanny Bien.  In short, Maxwell Webb is a 66 y.o. male with a chief complaint of intermittent numbness on left side.  Refer to the original H&P for additional details.  The current plan of care is to follow up MR brain as per Dr. Stevan Born recommendations.   [CF]    Clinical Course User Index [CF] Loleta Rose, MD [MQ] Sharyn Creamer, MD    ----------------------------------------- 3:02 PM on 02/27/2016 -----------------------------------------  Patient sitting up, resting comfortably without distress. No ongoing weakness noted in the left upper extremity. Patient understands planned, awaiting MRI.  Ongoing Care to Dr. York Cerise, if  no acute findings on MRI and new changes in exam discharge patient to continue his current antiplatelet therapy. If new stroke or concerning new MRI finding patient will require admission ____________________________________________   FINAL CLINICAL IMPRESSION(S) / ED DIAGNOSES  Final diagnoses:  Paresthesia      NEW MEDICATIONS STARTED DURING THIS VISIT:  New Prescriptions   No medications on file     Note:  This document was prepared using Dragon voice recognition software and may include unintentional dictation errors.     Sharyn Creamer, MD 02/27/16 1503   ----------------------------------------- 3:36 PM on 02/27/2016 -----------------------------------------  Patient went to MRI, thereafter refused to have the study performed. Reports he  does not feel claustrophobic, did not want any medication, just "didn't want an MRI". Discussed with the patient the reason, she reports all symptoms went away. He doesn't wish to stay any longer and would like to be discharged back to "the [facility]".  02/27/2016 at 3:39 PM:  The patient requested to leave.  I considered this to be leaving against medical advice. I personally discussed the following with them:  1)  That they currently had a medical condition of reported weakness in his left hand and tingling (of note the patient does report that all of his symptoms including weakness and tingling have completely resolved now) and I am concerned that they may have another stroke or possible "blood and bleeding" in the brain or may have had a another stroke or "mini stroke"  2)  My proposed course of evaluation and treatment includes, but is not limited to, getting an MRI to evaluate  for stroke or bleeding.  Benefits of staying include possible diagnosis or excluding of stroke or an alternative serious condition such as bleeding on the brain, which if identified early would lead to appropriate intervention in a timely manner lessening the burden of disability and death.  3) Risks of leaving before this had been completed include: misdiagnosis, worsening illness leading up to and including prolonged or permanent disability or death.  Specific risks pertinent, but not all inclusive, of their current medical condition include but are not limited to debilitating neurologic damage, permanent brain damage.  I also discussed alternatives including obtaining a CT scan here which would take less time, but the patient reports he does not wish this either.  Despite this they stated they wanted to leave due to feeling much better and refused further evaluation, treatment, or admission at this time.   They appeared clinically sober, were mentating appropriately, were free from distracting injury, had adequately  controlled acute pain, appeared to have intact insight, judgment, and reason, and in my opinion had the capacity to make this decision.  Specifically, they were able to verbally state back in a coherent manner their current medical condition/current diagnosis, the proposed course of evaluation and/or treatment, and the risks, benefits, and alternatives of treatment versus leaving against medical advice.   They understand that they may return to seek medical attention here at ANY time they want.  I strongly advised them to return to the Emergency Department immediately if they experience any new or worsening symptoms that concern them, or simply if they reconsider continued evaluation and/or treatment as previously discussed.  This would be without any repercussions, though they understand they likely will need to wait again in the Emergency Department if other patients are in front of them, rather than being brought straight back.  They understood this is another advantage of staying, but still insisted upon leaving.  I recommended they follow-up with Dr. Sherryll BurgerShah at the earliest available opportunity/appointment for further evaluation and treatment.   The patient was discharged against medical advice.  They did accept written discharge instructions.      Sharyn CreamerMark Quale, MD 02/27/16 [phone]

## 2016-03-11 ENCOUNTER — Emergency Department
Admission: EM | Admit: 2016-03-11 | Discharge: 2016-03-11 | Disposition: A | Discharge status: Home / Self Care | Payer: BC Managed Care – PPO | Attending: Emergency Medicine | Admitting: Emergency Medicine

## 2016-03-11 ENCOUNTER — Encounter: Payer: Self-pay | Admitting: Emergency Medicine

## 2016-03-11 DIAGNOSIS — I10 Essential (primary) hypertension: Secondary | ICD-10-CM | POA: Insufficient documentation

## 2016-03-11 DIAGNOSIS — R45851 Suicidal ideations: Secondary | ICD-10-CM

## 2016-03-11 DIAGNOSIS — F17210 Nicotine dependence, cigarettes, uncomplicated: Secondary | ICD-10-CM | POA: Insufficient documentation

## 2016-03-11 DIAGNOSIS — Z7982 Long term (current) use of aspirin: Secondary | ICD-10-CM | POA: Diagnosis not present

## 2016-03-11 DIAGNOSIS — Z79899 Other long term (current) drug therapy: Secondary | ICD-10-CM | POA: Diagnosis not present

## 2016-03-11 DIAGNOSIS — R4589 Other symptoms and signs involving emotional state: Secondary | ICD-10-CM | POA: Insufficient documentation

## 2016-03-11 LAB — COMPREHENSIVE METABOLIC PANEL
ALBUMIN: 3.9 g/dL (ref 3.5–5.0)
ALT: 24 U/L (ref 17–63)
AST: 25 U/L (ref 15–41)
Alkaline Phosphatase: 81 U/L (ref 38–126)
Anion gap: 8 (ref 5–15)
BUN: 13 mg/dL (ref 6–20)
CALCIUM: 9.2 mg/dL (ref 8.9–10.3)
CHLORIDE: 106 mmol/L (ref 101–111)
CO2: 26 mmol/L (ref 22–32)
CREATININE: 1.01 mg/dL (ref 0.61–1.24)
GFR calc Af Amer: >60 mL/min (ref >60)
GFR calc non Af Amer: >60 mL/min (ref >60)
GLUCOSE: 139 mg/dL — AB (ref 65–99)
POTASSIUM: 4 mmol/L (ref 3.5–5.1)
Sodium: 140 mmol/L (ref 135–145)
TOTAL PROTEIN: 7.7 g/dL (ref 6.5–8.1)
Total Bilirubin: 0.5 mg/dL (ref 0.3–1.2)

## 2016-03-11 LAB — CBC
HEMATOCRIT: 46.4 % (ref 40.0–52.0)
Hemoglobin: 16 g/dL (ref 13.0–18.0)
MCH: 33 pg (ref 26.0–34.0)
MCHC: 34.4 g/dL (ref 32.0–36.0)
MCV: 95.8 fL (ref 80.0–100.0)
Platelets: 253 10*3/uL (ref 150–440)
RBC: 4.85 MIL/uL (ref 4.40–5.90)
RDW: 13.9 % (ref 11.5–14.5)
WBC: 9.5 10*3/uL (ref 3.8–10.6)

## 2016-03-11 LAB — URINE DRUG SCREEN, QUALITATIVE (ARMC ONLY)
Amphetamines, Ur Screen: NOT DETECTED
BARBITURATES, UR SCREEN: POSITIVE — AB
BENZODIAZEPINE, UR SCRN: NOT DETECTED
Cannabinoid 50 Ng, Ur ~~LOC~~: NOT DETECTED
Cocaine Metabolite,Ur ~~LOC~~: NOT DETECTED
MDMA (Ecstasy)Ur Screen: NOT DETECTED
Methadone Scn, Ur: NOT DETECTED
OPIATE, UR SCREEN: NOT DETECTED
PHENCYCLIDINE (PCP) UR S: NOT DETECTED
Tricyclic, Ur Screen: NOT DETECTED

## 2016-03-11 LAB — ACETAMINOPHEN LEVEL

## 2016-03-11 LAB — SALICYLATE LEVEL

## 2016-03-11 LAB — ETHANOL

## 2016-03-11 NOTE — ED Notes (Signed)
[facility] arrived to pick up patient. [facility]work to [employer]

## 2016-03-11 NOTE — ED Notes (Signed)
Per soc pt will be discharged back to the [facility] once ivc is reversed.

## 2016-03-11 NOTE — BH Assessment (Signed)
Assessment Note  Maxwell Webb is an 66 y.o. male who presents to the ER via EMS from the [facility]. Patient called 911 and reported he was having thoughts to end his life by means of stabbing himself with a knife and or shooting himself with a gun. When EMS arrived to the facility, patient was unable to remember making the phone call. Patient has a dx of dementia.  Patient is well known in the ER due to similar ER visit. He calls 911 and forgets the chief complaint.  With this Clinical research associate, patient denies SI/HI and AV/H. Patient denies having a history of self-harm or violence.  Due to his current living arrangements at the care home, he has no access to a gun or a knife. They are kept in a secure place and only staff have access to them. He is a double amputee below the knee, so he have no way of getting to any forms of weapons, even if he had any present.  During the interview, the patient was calm, cooperative and pleasant. He was orient to his name, date of birth, place and situation. He reports of having no memory of the phone call but ER staff informed him of it.  Past Medical History:  Past Medical History:  Diagnosis Date  . Hyperlipemia   . Hypertension   . Korsakoff disease   . Nicotine abuse   . PVD (peripheral vascular disease) (HCC)     Past Surgical History:  Procedure Laterality Date  . BELOW KNEE LEG AMPUTATION Bilateral     Family History:  Family History  Problem Relation Age of Onset  . Hypertension Mother     Social History:  reports that he has been smoking Cigarettes.  He has a 10.00 pack-year smoking history. He has never used smokeless tobacco. He reports that he drinks about 3.6 oz of alcohol per week . He reports that he uses drugs, including Marijuana.  Additional Social History:  Alcohol / Drug Use Pain Medications: See PTA Prescriptions: See PTA Over the Counter: See PTA History of alcohol / drug use?: No history of alcohol / drug abuse Longest  period of sobriety (when/how long): n/a Negative Consequences of Use:  (n/a) Withdrawal Symptoms:  (n/a)  CIWA: CIWA-Ar BP: 114/90 Pulse Rate: 93 COWS:    Allergies: No Known Allergies  Home Medications:  (Not in a hospital admission)  OB/GYN Status:  No LMP for male patient.  General Assessment Data Location of Assessment: [facility] ED TTS Assessment: In system Is this a Tele or Face-to-Face Assessment?: Face-to-Face Is this an Initial Assessment or a Re-assessment for this encounter?: Initial Assessment Marital status: Single Maiden name: n/a Is patient pregnant?: No Pregnancy Status: No Living Arrangements: Other (Comment) (Lives in facility ) Can pt return to current living arrangement?: Yes Admission Status: Voluntary Is patient capable of signing voluntary admission?: Yes Referral Source: Self/Family/Friend Insurance type: BCBS  Medical Screening Exam [facility] Walk-in ONLY) Medical Exam completed: Yes  Crisis Care Plan Living Arrangements: Other (Comment) (Lives in facility ) Legal Guardian: Other: Haroon Shatto [phone], per previous assessment) Name of Psychiatrist: Reports of none Name of Therapist: Reports of none  Education Status Is patient currently in school?: No Current Grade: n/a Highest grade of school patient has completed: n/a Name of school: n/a Contact person: n/a  Risk to self with the past 6 months Suicidal Ideation: No-Not Currently/Within Last 6 Months Has patient been a risk to self within the past 6 months prior to  admission? : No Suicidal Intent: No Has patient had any suicidal intent within the past 6 months prior to admission? : No Is patient at risk for suicide?: No Suicidal Plan?: No-Not Currently/Within Last 6 Months Has patient had any suicidal plan within the past 6 months prior to admission? : Yes Access to Means: No What has been your use of drugs/alcohol within the last 12 months?: Reports of none Previous  Attempts/Gestures: No Other Self Harm Risks: Reports of none Triggers for Past Attempts: None known Intentional Self Injurious Behavior: None Family Suicide History: No Recent stressful life event(s): Other (Comment) Persecutory voices/beliefs?: No Depression: Yes Depression Symptoms: Loss of interest in usual pleasures Substance abuse history and/or treatment for substance abuse?: No Suicide prevention information given to non-admitted patients: Not applicable  Risk to Others within the past 6 months Homicidal Ideation: No Does patient have any lifetime risk of violence toward others beyond the six months prior to admission? : No Thoughts of Harm to Others: No Current Homicidal Intent: No Current Homicidal Plan: No Access to Homicidal Means: No Identified Victim: Reports of none History of harm to others?: No Assessment of Violence: None Noted Violent Behavior Description: Reports of none Does patient have access to weapons?: No Criminal Charges Pending?: No Does patient have a court date: No Is patient on probation?: No  Psychosis Hallucinations: None noted Delusions: None noted  Mental Status Report Appearance/Hygiene: Unremarkable, In scrubs Eye Contact: Good Motor Activity: Freedom of movement Speech: Logical/coherent, Soft Level of Consciousness: Alert Mood: Sad, Pleasant Affect: Sad, Appropriate to circumstance, Apprehensive Anxiety Level: Minimal Thought Processes: Coherent, Relevant Judgement: Partial Orientation: Person, Place, Time, Situation, Appropriate for developmental age Obsessive Compulsive Thoughts/Behaviors: Minimal  Cognitive Functioning Concentration: Normal Memory: Remote Impaired, Recent Impaired (Dx of Dementia) IQ: Average Insight: Poor Impulse Control: Poor Appetite: Good Weight Loss: 0 Weight Gain: 0 Sleep: No Change Total Hours of Sleep: 8 Vegetative Symptoms: None  ADLScreening [facility] Assessment Services) Patient's cognitive  ability adequate to safely complete daily activities?: Yes Patient able to express need for assistance with ADLs?: Yes Independently performs ADLs?: No  Prior Inpatient Therapy Prior Inpatient Therapy: No Prior Therapy Dates: Reports of none Prior Therapy Facilty/Provider(s): Reports of none Reason for Treatment: Reports of none  Prior Outpatient Therapy Prior Outpatient Therapy: No Prior Therapy Dates: Reports of none Prior Therapy Facilty/Provider(s): Reports of none Reason for Treatment: Reports of none Does patient have an ACCT team?: No Does patient have Intensive In-House Services?  : No Does patient have [facility] services? : No Does patient have P4CC services?: No  ADL Screening (condition at time of admission) Patient's cognitive ability adequate to safely complete daily activities?: Yes Is the patient deaf or have difficulty hearing?: No Does the patient have difficulty seeing, even when wearing glasses/contacts?: No Does the patient have difficulty concentrating, remembering, or making decisions?: Yes Patient able to express need for assistance with ADLs?: Yes Does the patient have difficulty dressing or bathing?: Yes Independently performs ADLs?: No Does the patient have difficulty walking or climbing stairs?: Yes Weakness of Legs: Both Weakness of Arms/Hands: None     Therapy Consults (therapy consults require a physician order) PT Evaluation Needed: No OT Evalulation Needed: No SLP Evaluation Needed: No Abuse/Neglect Assessment (Assessment to be complete while patient is alone) Physical Abuse: Denies Verbal Abuse: Denies Sexual Abuse: Denies Exploitation of patient/patient's resources: Denies Self-Neglect: Denies Values / Beliefs Cultural Requests During Hospitalization: None Spiritual Requests During Hospitalization: None Consults Spiritual Care Consult Needed: No  Social Work Consult Needed: No Merchant navy officer (For Healthcare) Does Patient Have a  Programmer, multimedia?: No    Additional Information 1:1 In Past 12 Months?: No CIRT Risk: No Elopement Risk: No Does patient have medical clearance?: Yes  Child/Adolescent Assessment Running Away Risk: Denies (Patient is an adult)  Disposition:  Disposition Initial Assessment Completed for this Encounter: Yes Disposition of Patient: Other dispositions (ER MD ordered Psych Consult [facility]))  On Site Evaluation by:   Reviewed with Physician:    Lilyan Gilford MS, LCAS, LPC, NCC, CCSI Therapeutic Triage Specialist 03/11/2016 1:37 PM

## 2016-03-11 NOTE — ED Triage Notes (Signed)
Pt lives at the [facility] - called police saying he had a knife and was going to kill himself. Pt did not have a knife on their arrival but was stating he is tired of living. Pt is bil bka

## 2016-03-11 NOTE — ED Notes (Signed)
Pt has dementia - states he doesn't believe he called any one and threatened suicide. But also states if he had a gun he would say goodbye to his girls and shoot himself in the head.

## 2016-03-11 NOTE — ED Notes (Signed)
Contacted the [facility] and let the nurse know the patient will be returning by ems

## 2016-03-11 NOTE — ED Provider Notes (Addendum)
[facility] Emergency Department Provider Note  ____________________________________________   First MD Initiated Contact with Patient 03/11/16 1127     (approximate)  I have reviewed the triage vital signs and the nursing notes.   HISTORY  Chief Complaint Suicidal   HPI Maxwell Webb is a 66 y.o. male with a history of peripheral vascular disease who is presenting to the emergency department today with suicidal ideation. He says, prior to arrival, that he wanted to kill himself with a knife. He reports prior to arrival also the patient felt hopeless. He confirms feeling hopeless at this time but denies having any suicidal intent at this time. His that he thinks that this was a "cry for help."   Past Medical History:  Diagnosis Date  . Hyperlipemia   . Hypertension   . Korsakoff disease   . Nicotine abuse   . PVD (peripheral vascular disease) [facility](HCC)     Patient Active Problem List   Diagnosis Date Noted  . CVA (cerebral vascular accident) (HCC) 02/16/2016  . TIA (transient ischemic attack) 01/04/2016  . 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 BELOW KNEE LEG AMPUTATION Bilateral     Prior to Admission medications   Medication Sig Start Date End Date Taking? Authorizing Provider  aspirin EC 81 MG EC tablet Take 1 tablet (81 mg total) by mouth daily. 02/18/16   Ramonita LabAruna Gouru, MD  atorvastatin (LIPITOR) 40 MG tablet Take 1 tablet (40 mg total) by mouth daily at 6 PM. Patient taking differently: Take 40 mg by mouth every evening.  01/05/16   Shaune PollackQing Chen, MD  butalbital-acetaminophen-caffeine (FIORICET, ESGIC) [phone] MG tablet Take 1 tablet by mouth every 8 (eight) hours as needed for headache.    Historical Provider, MD  clopidogrel (PLAVIX) 75 MG tablet Take 1 tablet (75 mg total) by mouth daily. 02/18/16   Ramonita LabAruna Gouru, MD    DULoxetine (CYMBALTA) 60 MG capsule Take 60 mg by mouth daily.    Historical Provider, MD  gabapentin (NEURONTIN) 100 MG capsule Take 100 mg by mouth 2 (two) times daily.    Historical Provider, MD  ibuprofen (ADVIL,MOTRIN) 200 MG tablet Take 200 mg by mouth every 6 (six) hours as needed.    Historical Provider, MD    Allergies Patient has no known allergies.  Family History  Problem Relation Age of Onset  . Hypertension Mother     Social History Social History  Substance Use Topics  . Smoking status: Current Every Day Smoker    Packs/day: 0.50    Years: 20.00    Types: Cigarettes  . Smokeless tobacco: Never Used  . Alcohol use 3.6 oz/week    6 Cans of beer per week     Comment: once a /week    Review of Systems Constitutional: No fever/chills Eyes: No visual changes. ENT: No sore throat. Cardiovascular: Denies chest pain. Respiratory: Denies shortness of breath. Gastrointestinal: No abdominal pain.  No nausea, no vomiting.  No diarrhea.  No constipation. Genitourinary: Negative for dysuria. Musculoskeletal: Negative for back pain. Skin: Negative for rash. Neurological: Negative for headaches, focal weakness or numbness.  10-point ROS otherwise negative.  ____________________________________________   PHYSICAL EXAM:  VITAL SIGNS: ED Triage Vitals  Enc Vitals Group     BP 03/11/16 1115 114/90     Pulse Rate 03/11/16 1115 93     Resp 03/11/16 1115 18  Temp 03/11/16 1115 97.6 F (36.4 C)     Temp Source 03/11/16 1115 Oral     SpO2 03/11/16 1115 96 %     Weight 03/11/16 1115 220 lb (99.8 kg)     Height --      Head Circumference --      Peak Flow --      Pain Score 03/11/16 1116 0     Pain Loc --      Pain Edu? --      Excl. in GC? --     Constitutional: Alert and oriented. Well appearing and in no acute distress. Eyes: Conjunctivae are normal. PERRL. EOMI. Head: Atraumatic. Nose: No congestion/rhinnorhea. Mouth/Throat: Mucous membranes are  moist. Neck: No stridor.   Cardiovascular: Normal rate, regular rhythm. Grossly normal heart sounds.   Respiratory: Normal respiratory effort.  No retractions. Lungs CTAB. Gastrointestinal: Soft and nontender. No distention.  Musculoskeletal: No lower extremity tenderness nor edema.  No joint effusions.  The lateral be case with the stumps intact. Neurologic:  Normal speech and language. No gross focal neurologic deficits are appreciated. No gait instability. Skin:  Skin is warm, dry and intact. No rash noted. Psychiatric: Mood and affect are normal. Speech and behavior are normal.  ____________________________________________   LABS (all labs ordered are listed, but only abnormal results are displayed)  Labs Reviewed  COMPREHENSIVE METABOLIC PANEL - Abnormal; Notable for the following:       Result Value   Glucose, Bld 139 (*)    All other components within normal limits  ACETAMINOPHEN LEVEL - Abnormal; Notable for the following:    Acetaminophen (Tylenol), Serum <10 (*)    All other components within normal limits  URINE DRUG SCREEN, QUALITATIVE ([facility] ONLY) - Abnormal; Notable for the following:    Barbiturates, Ur Screen POSITIVE (*)    All other components within normal limits  ETHANOL  SALICYLATE LEVEL  CBC   ____________________________________________  EKG   ____________________________________________  RADIOLOGY   ____________________________________________   PROCEDURES  Procedure(s) performed:   Procedures  Critical Care performed:   ____________________________________________   INITIAL IMPRESSION / ASSESSMENT AND PLAN / ED COURSE  Pertinent labs & imaging results that were available during my care of the patient were reviewed by me and considered in my medical decision making (see chart for details).  Patient aware of need for involuntary commitment. Pending psychiatric recommendations at this time.     ----------------------------------------- 1:39 PM on 03/11/2016 -----------------------------------------  Patient now denying any suicidal ideation or intent. Seen by the psychiatrist, Dr. Maricela Bo, who recommends the patient for discharge. Does not recommend any medication changes or additional medications. IVC rescinded as well by Dr. Maricela Bo. Patient will be given [facility] follow-up for further psychiatric counseling. I discussed this plan with the patient is understanding and wanted to comply.  ____________________________________________   FINAL CLINICAL IMPRESSION(S) / ED DIAGNOSES  Suicidal ideation.    NEW MEDICATIONS STARTED DURING THIS VISIT:  New Prescriptions   No medications on file     Note:  This document was prepared using Dragon voice recognition software and may include unintentional dictation errors.    Myrna Blazer, MD 03/11/16 1212    Myrna Blazer, MD 03/11/16 1340

## 2016-03-17 ENCOUNTER — Emergency Department: Payer: BC Managed Care – PPO

## 2016-03-17 ENCOUNTER — Emergency Department
Admission: EM | Admit: 2016-03-17 | Discharge: 2016-03-17 | Disposition: A | Discharge status: Home / Self Care | Payer: BC Managed Care – PPO | Attending: Emergency Medicine | Admitting: Emergency Medicine

## 2016-03-17 ENCOUNTER — Encounter: Payer: Self-pay | Admitting: Emergency Medicine

## 2016-03-17 DIAGNOSIS — R202 Paresthesia of skin: Secondary | ICD-10-CM | POA: Insufficient documentation

## 2016-03-17 DIAGNOSIS — Z79899 Other long term (current) drug therapy: Secondary | ICD-10-CM | POA: Diagnosis not present

## 2016-03-17 DIAGNOSIS — F17210 Nicotine dependence, cigarettes, uncomplicated: Secondary | ICD-10-CM | POA: Insufficient documentation

## 2016-03-17 DIAGNOSIS — Z7982 Long term (current) use of aspirin: Secondary | ICD-10-CM | POA: Diagnosis not present

## 2016-03-17 DIAGNOSIS — F1290 Cannabis use, unspecified, uncomplicated: Secondary | ICD-10-CM | POA: Insufficient documentation

## 2016-03-17 DIAGNOSIS — I10 Essential (primary) hypertension: Secondary | ICD-10-CM | POA: Insufficient documentation

## 2016-03-17 DIAGNOSIS — R200 Anesthesia of skin: Secondary | ICD-10-CM | POA: Diagnosis present

## 2016-03-17 DIAGNOSIS — R079 Chest pain, unspecified: Secondary | ICD-10-CM | POA: Insufficient documentation

## 2016-03-17 LAB — CBC WITH DIFFERENTIAL/PLATELET
BASOS PCT: 1 %
Basophils Absolute: 0.1 10*3/uL (ref 0–0.1)
Eosinophils Absolute: 0.9 10*3/uL — ABNORMAL HIGH (ref 0–0.7)
Eosinophils Relative: 9 %
HEMATOCRIT: 47 % (ref 40.0–52.0)
Hemoglobin: 15.8 g/dL (ref 13.0–18.0)
LYMPHS PCT: 22 %
Lymphs Abs: 2.2 10*3/uL (ref 1.0–3.6)
MCH: 32.5 pg (ref 26.0–34.0)
MCHC: 33.7 g/dL (ref 32.0–36.0)
MCV: 96.4 fL (ref 80.0–100.0)
MONOS PCT: 8 %
Monocytes Absolute: 0.8 10*3/uL (ref 0.2–1.0)
NEUTROS ABS: 6 10*3/uL (ref 1.4–6.5)
Neutrophils Relative %: 60 %
Platelets: 252 10*3/uL (ref 150–440)
RBC: 4.88 MIL/uL (ref 4.40–5.90)
RDW: 14.4 % (ref 11.5–14.5)
WBC: 10 10*3/uL (ref 3.8–10.6)

## 2016-03-17 LAB — BASIC METABOLIC PANEL
Anion gap: 8 (ref 5–15)
BUN: 15 mg/dL (ref 6–20)
CALCIUM: 9.2 mg/dL (ref 8.9–10.3)
CO2: 28 mmol/L (ref 22–32)
CREATININE: 0.81 mg/dL (ref 0.61–1.24)
Chloride: 103 mmol/L (ref 101–111)
GFR calc Af Amer: >60 mL/min (ref >60)
GFR calc non Af Amer: >60 mL/min (ref >60)
GLUCOSE: 176 mg/dL — AB (ref 65–99)
Potassium: 4.4 mmol/L (ref 3.5–5.1)
Sodium: 139 mmol/L (ref 135–145)

## 2016-03-17 LAB — TROPONIN I: Troponin I: <0.03 ng/mL (ref <0.03)

## 2016-03-17 MED ORDER — BUTALBITAL-APAP-CAFFEINE 50-325-40 MG PO TABS
1.0000 | ORAL_TABLET | Freq: Once | ORAL | Status: AC
  Administered 2016-03-17: 1 via ORAL

## 2016-03-17 MED ORDER — ACETAMINOPHEN 500 MG PO TABS
ORAL_TABLET | ORAL | Status: AC
  Filled 2016-03-17: qty 2

## 2016-03-17 MED ORDER — ACETAMINOPHEN 500 MG PO TABS
1000.0000 mg | ORAL_TABLET | Freq: Once | ORAL | Status: AC
  Administered 2016-03-17: 1000 mg via ORAL

## 2016-03-17 MED ORDER — ACETAMINOPHEN 325 MG PO TABS: 650.0000 mg | ORAL_TABLET | Freq: Once | ORAL | Status: DC

## 2016-03-17 MED ORDER — BUTALBITAL-APAP-CAFFEINE 50-325-40 MG PO TABS
ORAL_TABLET | ORAL | Status: AC
  Administered 2016-03-17: 1 via ORAL
  Filled 2016-03-17: qty 1

## 2016-03-17 NOTE — ED Notes (Signed)
Patient transported to MRI 

## 2016-03-17 NOTE — ED Notes (Signed)
Pt to ct scan.

## 2016-03-17 NOTE — ED Triage Notes (Signed)
Pt to ed with c/o chest pain and left side numbness both arm and leg.   Pt states symptoms started about 1 hour pta.  Pt alert and oriented.  Pt placed on cm, upon arrival to ed.  md at bedside.

## 2016-03-17 NOTE — Discharge Instructions (Signed)
Please seek medical attention for any high fevers, chest pain, shortness of breath, change in behavior, persistent vomiting, bloody stool or any other new or concerning symptoms.  

## 2016-03-17 NOTE — ED Notes (Signed)
Pt's guardian, Maxwell Webb, called per instructions,   Address: [address], [place], [place] [address]  Phone: [phone]  Report attempted att no answer or message possible.

## 2016-03-17 NOTE — ED Provider Notes (Signed)
[facility] Emergency Department Provider Note    ____________________________________________   I have reviewed the triage vital signs and the nursing notes.   HISTORY  Chief Complaint Left sided weakness  History limited by: Korsakoff   HPI Maxwell Webb is a 66 y.o. male who presents to the emergency department today because of concerns for left-sided numbness. It started about an hour and a half ago. It has been constant. States it is both his left upper extremity and his left lower extremity. Patient has had associated chest pain. Is located in the left side of his chest. Patient has had similar symptoms in the past and has been seen multiple times in this emergency department for similar symptoms. Most recently seen roughly a week ago. MR was recommended at that time which the patient refused. Patient did leave AMA.   Past Medical History:  Diagnosis Date  . Hyperlipemia   . Hypertension   . Korsakoff disease   . Nicotine abuse   . PVD (peripheral vascular disease) [facility])     Patient Active Problem List   Diagnosis Date Noted  . CVA (cerebral vascular accident) (HCC) 02/16/2016  . TIA (transient ischemic attack) 01/04/2016  . 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 BELOW KNEE LEG AMPUTATION Bilateral     Prior to Admission medications   Medication Sig Start Date End Date Taking? Authorizing Provider  aspirin EC 81 MG EC tablet Take 1 tablet (81 mg total) by mouth daily. 02/18/16   Ramonita Lab, MD  atorvastatin (LIPITOR) 40 MG tablet Take 1 tablet (40 mg total) by mouth daily at 6 PM. Patient taking differently: Take 40 mg by mouth every evening.  01/05/16   Shaune Pollack, MD  butalbital-acetaminophen-caffeine (FIORICET, ESGIC) [phone] MG tablet Take 1 tablet by mouth every 8 (eight) hours as needed for headache.     Historical Provider, MD  clopidogrel (PLAVIX) 75 MG tablet Take 1 tablet (75 mg total) by mouth daily. 02/18/16   Ramonita Lab, MD  DULoxetine (CYMBALTA) 60 MG capsule Take 60 mg by mouth daily.    Historical Provider, MD  gabapentin (NEURONTIN) 100 MG capsule Take 100 mg by mouth 2 (two) times daily.    Historical Provider, MD  ibuprofen (ADVIL,MOTRIN) 200 MG tablet Take 200 mg by mouth every 6 (six) hours as needed.    Historical Provider, MD    Allergies Patient has no known allergies.  Family History  Problem Relation Age of Onset  . Hypertension Mother     Social History Social History  Substance Use Topics  . Smoking status: Current Every Day Smoker    Packs/day: 0.50    Years: 20.00    Types: Cigarettes  . Smokeless tobacco: Never Used  . Alcohol use 3.6 oz/week    6 Cans of beer per week     Comment: once a /week    Review of Systems  Constitutional: Negative for fever. Cardiovascular: Positive for chest pain. Respiratory: Negative for shortness of breath. Gastrointestinal: Negative for abdominal pain, vomiting and diarrhea. Neurological: Positive for left sided weakness.   10-point ROS otherwise negative.  ____________________________________________   PHYSICAL EXAM:  VITAL SIGNS: ED Triage Vitals  Enc Vitals Group     BP 03/17/16 1752 (!) 116/97     Pulse Rate 03/17/16 1752 (!) 105     Resp 03/17/16 1752 20  Temp 03/17/16 1752 98.2 F (36.8 C)     Temp Source 03/17/16 1752 Oral     SpO2 03/17/16 1752 100 %     Weight 03/17/16 1753 220 lb (99.8 kg)     Height --      Head Circumference --      Peak Flow --      Pain Score 03/17/16 1753 6   Constitutional: Alert and oriented. Well appearing and in no distress. Eyes: Conjunctivae are normal. Normal extraocular movements. ENT   Head: Normocephalic and atraumatic.   Nose: No congestion/rhinnorhea.   Mouth/Throat: Mucous membranes are moist.   Neck: No  stridor. Hematological/Lymphatic/Immunilogical: No cervical lymphadenopathy. Cardiovascular: Normal rate, regular rhythm.  No murmurs, rubs, or gallops.  Respiratory: Normal respiratory effort without tachypnea nor retractions. Breath sounds are clear and equal bilaterally. No wheezes/rales/rhonchi. Gastrointestinal: Soft and non tender. No rebound. No guarding.  Genitourinary: Deferred Musculoskeletal: Normal range of motion in all extremities. No lower extremity edema. Neurologic:  Normal speech and language. Face symmetric. Tongue midline. PERRL. EOMI. Strength 5/5 in upper extremities. Sensation subjectively changed on left upper and lower extremity, however patient states he can still feel.  Skin:  Skin is warm, dry and intact. No rash noted. Psychiatric: Mood and affect are normal. Speech and behavior are normal. Patient exhibits appropriate insight and judgment.  ____________________________________________    LABS (pertinent positives/negatives)  Labs Reviewed  CBC WITH DIFFERENTIAL/PLATELET - Abnormal; Notable for the following:       Result Value   Eosinophils Absolute 0.9 (*)    All other components within normal limits  BASIC METABOLIC PANEL - Abnormal; Notable for the following:    Glucose, Bld 176 (*)    All other components within normal limits  TROPONIN I     ____________________________________________   EKG  None  ____________________________________________    RADIOLOGY  CT head IMPRESSION:  1. No acute findings. No intracranial mass, hemorrhage or edema.  2. Chronic ischemic changes, as detailed above.      MR brain IMPRESSION:  1. No acute finding.  2. 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      ____________________________________________   PROCEDURES  Procedures  ______________________________________   INITIAL IMPRESSION / ASSESSMENT AND PLAN / ED COURSE  Pertinent labs & imaging results that were  available during my care of the patient were reviewed by me and considered in my medical decision making (see chart for details).  Patient presented to the emergency department today because of changes in sensation of the left upper arm and left lower leg. Patient has been seen in the emergency department multiple times for the same symptoms. MRI was performed which did not show any acute findings. Blood work without any concerning findings. Will discharge back to living facility.  ____________________________________________   FINAL CLINICAL IMPRESSION(S) / ED DIAGNOSES  Final diagnoses:  Paresthesia of left arm and leg     Note: This dictation was prepared with Dragon dictation. Any transcriptional errors that result from this process are unintentional     Phineas SemenGraydon Loretta Kluender, MD 03/17/16 2029

## 2016-03-17 NOTE — ED Notes (Signed)
Pt smiling and asking for the the "game "to be turned on, pt changed out of clothes for MRI

## 2016-03-17 NOTE — ED Notes (Signed)
Pt to ed with c/o chest pain that started about 1 hour pta.  Pt reports sob and left sided numbness and weakness and left sided headache.  Pt alert and oriented.  NSR on the monitor at this time.  Pt appears in nad.

## 2016-04-10 ENCOUNTER — Encounter: Payer: Self-pay | Admitting: Emergency Medicine

## 2016-04-10 ENCOUNTER — Emergency Department
Admission: EM | Admit: 2016-04-10 | Discharge: 2016-04-10 | Disposition: A | Discharge status: Home / Self Care | Payer: BC Managed Care – PPO | Attending: Emergency Medicine | Admitting: Emergency Medicine

## 2016-04-10 DIAGNOSIS — F17210 Nicotine dependence, cigarettes, uncomplicated: Secondary | ICD-10-CM | POA: Diagnosis not present

## 2016-04-10 DIAGNOSIS — I10 Essential (primary) hypertension: Secondary | ICD-10-CM | POA: Insufficient documentation

## 2016-04-10 DIAGNOSIS — R202 Paresthesia of skin: Secondary | ICD-10-CM | POA: Insufficient documentation

## 2016-04-10 DIAGNOSIS — Z7982 Long term (current) use of aspirin: Secondary | ICD-10-CM | POA: Insufficient documentation

## 2016-04-10 LAB — CBC WITH DIFFERENTIAL/PLATELET
BASOS ABS: 0.1 10*3/uL (ref 0–0.1)
BASOS PCT: 1 %
EOS ABS: 0.5 10*3/uL (ref 0–0.7)
EOS PCT: 7 %
HCT: 48.5 % (ref 40.0–52.0)
Hemoglobin: 16.4 g/dL (ref 13.0–18.0)
LYMPHS PCT: 31 %
Lymphs Abs: 2.5 10*3/uL (ref 1.0–3.6)
MCH: 32.6 pg (ref 26.0–34.0)
MCHC: 33.8 g/dL (ref 32.0–36.0)
MCV: 96.5 fL (ref 80.0–100.0)
MONO ABS: 0.8 10*3/uL (ref 0.2–1.0)
Monocytes Relative: 10 %
Neutro Abs: 4.1 10*3/uL (ref 1.4–6.5)
Neutrophils Relative %: 51 %
PLATELETS: 258 10*3/uL (ref 150–440)
RBC: 5.02 MIL/uL (ref 4.40–5.90)
RDW: 15.2 % — AB (ref 11.5–14.5)
WBC: 8 10*3/uL (ref 3.8–10.6)

## 2016-04-10 LAB — COMPREHENSIVE METABOLIC PANEL
ALT: 27 U/L (ref 17–63)
AST: 30 U/L (ref 15–41)
Albumin: 3.7 g/dL (ref 3.5–5.0)
Alkaline Phosphatase: 80 U/L (ref 38–126)
Anion gap: 8 (ref 5–15)
BUN: 26 mg/dL — AB (ref 6–20)
CHLORIDE: 108 mmol/L (ref 101–111)
CO2: 24 mmol/L (ref 22–32)
Calcium: 9.2 mg/dL (ref 8.9–10.3)
Creatinine, Ser: 1.04 mg/dL (ref 0.61–1.24)
GFR calc Af Amer: >60 mL/min (ref >60)
Glucose, Bld: 174 mg/dL — ABNORMAL HIGH (ref 65–99)
POTASSIUM: 4.1 mmol/L (ref 3.5–5.1)
SODIUM: 140 mmol/L (ref 135–145)
Total Bilirubin: 0.4 mg/dL (ref 0.3–1.2)
Total Protein: 7.5 g/dL (ref 6.5–8.1)

## 2016-04-10 LAB — TROPONIN I

## 2016-04-10 NOTE — ED Triage Notes (Signed)
Pt in via EMS from the [facility] with complaints of left side numbness and tingling starting approximately 90 minutes ago.  Pt reports "slight headache," denies any changes to vision.  Pt with no neuro deficits at this time.  MD to bedside.

## 2016-04-10 NOTE — ED Provider Notes (Signed)
[facility] Emergency Department Provider Note       Time seen: ----------------------------------------- 1:41 PM on 04/10/2016 -----------------------------------------     I have reviewed the triage vital signs and the nursing notes.   HISTORY   Chief Complaint Numbness    HPI Maxwell Webb is a 66 y.o. male who presents to the ED for left-sided tingling and numbness. Patient reports history of same on and off in the past. Patient states when it happens it scares him and so he comes to the ER. Patient describes the feeling in his left arm like when your foot goes to sleep. He denies any other neurologic symptoms, the symptoms seem to come and go. He denies any other illness or complaint.   Past Medical History:  Diagnosis Date  . Hyperlipemia   . Hypertension   . Korsakoff disease   . Nicotine abuse   . PVD (peripheral vascular disease) [facility])     Patient Active Problem List   Diagnosis Date Noted  . CVA (cerebral vascular accident) (HCC) 02/16/2016  . TIA (transient ischemic attack) 01/04/2016  . 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 BELOW KNEE LEG AMPUTATION Bilateral     Allergies Patient has no known allergies.  Social History Social History  Substance Use Topics  . Smoking status: Current Every Day Smoker    Packs/day: 1.00    Years: 20.00    Types: Cigarettes  . Smokeless tobacco: Never Used  . Alcohol use 3.6 oz/week    6 Cans of beer per week     Comment: once a /week    Review of Systems Constitutional: Negative for fever. Cardiovascular: Negative for chest pain. Respiratory: Negative for shortness of breath. Gastrointestinal: Negative for abdominal pain, vomiting and diarrhea. Genitourinary: Negative for dysuria. Musculoskeletal: Negative for back pain. Skin: Negative for  rash. Neurological: Negative for headaches, Positive for left arm paresthesias  10-point ROS otherwise negative.  ____________________________________________   PHYSICAL EXAM:  VITAL SIGNS: ED Triage Vitals  Enc Vitals Group     BP --      Pulse Rate 04/10/16 1339 (!) 102     Resp 04/10/16 1339 16     Temp 04/10/16 1339 97.7 F (36.5 C)     Temp Source 04/10/16 1339 Oral     SpO2 04/10/16 1339 93 %     Weight 04/10/16 1339 220 lb (99.8 kg)     Height 04/10/16 1339 6' (1.829 m)     Head Circumference --      Peak Flow --      Pain Score 04/10/16 1338 6     Pain Loc --      Pain Edu? --      Excl. in GC? --     Constitutional: Alert and oriented. Well appearing and in no distress. Eyes: Conjunctivae are normal. PERRL. Normal extraocular movements. ENT   Head: Normocephalic and atraumatic.   Nose: No congestion/rhinnorhea.   Mouth/Throat: Mucous membranes are moist.   Neck: No stridor. Cardiovascular: Normal rate, regular rhythm. No murmurs, rubs, or gallops. Respiratory: Normal respiratory effort without tachypnea nor retractions. Breath sounds are clear and equal bilaterally. No wheezes/rales/rhonchi. Gastrointestinal: Soft and nontender. Normal bowel sounds Musculoskeletal: Nontender with normal range of motion in extremities.Bilateral lower extremity amputation Neurologic:  Normal speech and language. No gross focal neurologic deficits are appreciated. Paresthesias in the left arm  compared to right. Normal strength Skin:  Skin is warm, dry and intact. No rash noted. Psychiatric: Mood and affect are normal. Speech and behavior are normal.  ____________________________________________  EKG: Interpreted by me.Sinus rhythm with a rate of 100 bpm, prolonged PR interval, wide QRS, right bundle branch block  ____________________________________________  ED COURSE:  Pertinent labs & imaging results that were available during my care of the patient were  reviewed by me and considered in my medical decision making (see chart for details). Patient presents for left-sided paresthesias with a history of same, we will assess with labs and imaging as indicated.   Procedures ____________________________________________   LABS (pertinent positives/negatives)  Labs Reviewed  CBC WITH DIFFERENTIAL/PLATELET - Abnormal; Notable for the following:       Result Value   RDW 15.2 (*)    All other components within normal limits  COMPREHENSIVE METABOLIC PANEL - Abnormal; Notable for the following:    Glucose, Bld 174 (*)    BUN 26 (*)    All other components within normal limits  TROPONIN I   ___________________________________________  FINAL ASSESSMENT AND PLAN  Paresthesias  Plan: Patient's labs and imaging were dictated above. Patient had presented for Paresthesias which are intermittent for him and appear to be at baseline. No focal neurologic deficits are appreciated. He is stable for outpatient follow-up.   Emily FilbertWilliams, Jonathan E, MD   Note: This note was generated in part or whole with voice recognition software. Voice recognition is usually quite accurate but there are transcription errors that can and very often do occur. I apologize for any typographical errors that were not detected and corrected.     Emily FilbertJonathan E Williams, MD 04/10/16 [id number]

## 2016-04-17 ENCOUNTER — Ambulatory Visit (INDEPENDENT_AMBULATORY_CARE_PROVIDER_SITE_OTHER): Payer: Medicare Other | Admitting: Neurology

## 2016-04-17 ENCOUNTER — Telehealth: Payer: Self-pay | Admitting: Neurology

## 2016-04-17 ENCOUNTER — Encounter: Payer: Self-pay | Admitting: Neurology

## 2016-04-17 DIAGNOSIS — R202 Paresthesia of skin: Secondary | ICD-10-CM | POA: Diagnosis not present

## 2016-04-17 DIAGNOSIS — R200 Anesthesia of skin: Secondary | ICD-10-CM | POA: Diagnosis not present

## 2016-04-17 NOTE — Telephone Encounter (Signed)
Hey Dr. Terrace Arabia. For the ncv/emg, the pt is not able to get here on Friday's.  Let me know what you'd like me to do. Thanks!

## 2016-04-17 NOTE — Progress Notes (Signed)
PATIENT: Maxwell Webb DOB: January 26, 1950  Chief Complaint  Patient presents with  . Cerebrovascular Accident    He resides at [facility] [place] and he is here today with a driver from the facility Boyd Kerbs).  Reports recently having an abnormal brain MRI that showed stroke activity.  He is currenlty on both aspirin  and Plavix .  He has continued weakness on his left side.  [name] PCP    [facility]   . Neurology    Hemang Marylou Mccoy, MD - referring MD     HISTORICAL  [name] is a 66 years old right-handed male, accompanied by the [place] [facility] [place] staff Boyd Kerbs for evaluation of possible stroke, seen in refer by his primary care physician Dr. 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  Over the past few months, he complains of intermittent left fourth and fifth finger numbness tingling, he was taken to the emergency room multiple times by his facility concerning of the stroke,  Also had multiple ED presentation for suicidal ideation, he also complains of mild neck pain, he spent most of the time sitting in his wheelchair, resting his elbow on a heart chair arm   I have personally reviewed MRI of the brain on April 13 2016: No acute abnormality, chronic small vessel disease without progression Ultrasound of carotid Doppler on January 04 2016, moderate heterogeneous and calcified plaque, no hemodynamic significant stenosis, Chest x-ray on February 10 2016, no acute cardiopulmonary disease. Laboratory evaluations on April 10 2016: Normal CMP, with exception of elevated glucose 174, CBC hemoglobin of 16 point 4,  negative troponin, UDS was positive for barbiturate on March 11 2016, negative Tylenol, alcohol level  Echocardiogram December 2017, ejection fraction was within normal limits.  REVIEW OF SYSTEMS: Full 14 system review of systems performed and notable only for confusion, headaches, numbness on the left side, depression anxiety, racing thoughts.  ALLERGIES: No Known Allergies  HOME MEDICATIONS: Current Outpatient Prescriptions  Medication Sig Dispense Refill  . ARIPiprazole (ABILIFY) 10 MG tablet Take 10 mg by mouth daily.     [name] aspirin EC 81 MG EC tablet Take 1 tablet (81 mg total) by mouth daily.    [name] atorvastatin (LIPITOR) 40 MG tablet Take 1 tablet (40 mg total) by mouth daily at 6 PM. (Patient taking differently: Take 40 mg by mouth every evening. ) 30 tablet 2  . butalbital-acetaminophen-caffeine (FIORICET, ESGIC) 50-325-40 MG tablet Take 1 tablet by mouth every 8 (eight) hours as needed for headache.    . clopidogrel (PLAVIX) 75 MG tablet Take 1 tablet (75 mg total) by mouth daily. 30 tablet 0  . DULoxetine (CYMBALTA) 60 MG capsule Take 60 mg by mouth daily.    . folic acid (FOLVITE) 1 MG tablet Take 1 mg by mouth daily.    [name] gabapentin (NEURONTIN) 100 MG capsule Take 100 mg by mouth 2 (two) times daily.    [name] ibuprofen (ADVIL,MOTRIN) 200 MG tablet Take 200 mg by mouth every 6 (six) hours as needed.    . thiamine (VITAMIN B-1) 100 MG tablet Take 100 mg by mouth 2 (two) times daily.  No current facility-administered medications for this visit.     PAST MEDICAL HISTORY: Past Medical History:  Diagnosis Date  . Anxiety   . Depression   . Hyperlipemia   . Hypertension   . Korsakoff disease   . Nicotine abuse   . Osteomyelitis (HCC)   . PVD (peripheral vascular disease) (HCC)   . Stroke [facility])     PAST SURGICAL HISTORY: Past Surgical History:  Procedure Laterality Date  . BELOW KNEE LEG AMPUTATION Bilateral   . FINGER SURGERY     left index finger - as a child     FAMILY HISTORY: Family History  Problem Relation Age of Onset  . Hypertension Mother   . Other Mother     brain bleed from accidental fall on ice - age 46  . Diabetes Father     SOCIAL HISTORY:  Social History   Social History  . Marital status: Single    Spouse name: N/A  . Number of children: 2  . Years of education: 2 years college   Occupational History  . Disabled    Social History Main Topics  . Smoking status: Current Every Day Smoker    Packs/day: 1.00    Years: 20.00    Types: Cigarettes  . Smokeless tobacco: Never Used  . Alcohol use No  . Drug use: No  . Sexual activity: Not on file   Other Topics Concern  . Not on file   Social History Narrative   Lives at [facility] [place].   Right-handed.   No caffeine use.     PHYSICAL EXAM   Vitals:   04/17/16 1413  BP: 120/86  Pulse: (!) 119    Not recorded      There is no height or weight on file to calculate BMI.  PHYSICAL EXAMNIATION:  Gen: NAD, conversant, well nourised, obese, well groomed                     Cardiovascular: Regular rate rhythm, no peripheral edema, warm, nontender. Eyes: Conjunctivae clear without exudates or hemorrhage Neck: Supple, no carotid bruits. Pulmonary: Clear to auscultation bilaterally   NEUROLOGICAL EXAM:  MENTAL STATUS: Speech:    Speech is normal; fluent and spontaneous with normal comprehension.  Cognition:     Orientation to time, place and person     Normal recent and remote memory     Normal Attention span and concentration     Normal Language, naming, repeating,spontaneous speech     Fund of knowledge   CRANIAL NERVES: CN II: Visual fields are full to confrontation. Fundoscopic exam is normal with sharp discs and no vascular changes. Pupils are round equal and briskly reactive to light. CN III, IV, VI: extraocular movement are normal. No ptosis. CN V: Facial sensation is intact to pinprick in all 3 divisions bilaterally. Corneal responses  are intact.  CN VII: Face is symmetric with normal eye closure and smile. CN VIII: Hearing is normal to rubbing fingers CN IX, X: Palate elevates symmetrically. Phonation is normal. CN XI: Head turning and shoulder shrug are intact CN XII: Tongue is midline with normal movements and no atrophy.  MOTOR: He has bilateral below the knee amputation, mild left hand intrinsic muscle atrophy, mildly left finger abduction, left fourth and fifth finger flexion weakness.  Left elbow Tinel sign, tenderness upon palpitation.  REFLEXES: Reflexes are  hypoactive  and symmetric at the biceps, triceps, knees, and ankles. Plantar responses are flexor.  SENSORY:  Decreased light touch, pinprick at left fourth and fifth fingers, involving left ulnar hand  COORDINATION: Rapid alternating movements and fine finger movements are intact. There is no dysmetria on finger-to-nose and heel-knee-shin.    GAIT/STANCE: Deferred  DIAGNOSTIC DATA (LABS, IMAGING, TESTING) - I reviewed patient records, labs, notes, testing and imaging myself where available.   ASSESSMENT AND PLAN  Chaun Uemura is a 66 y.o. male   Left hand paresthesia  Most suggestive of left ulnar neuropathy, he has left elbow Tinel sign  EMG nerve conduction study  Avoid left elbow compression  Levert Feinstein, M.D. Ph.D.  [facility] [address], [address] [place], [place] [address] Ph: [phone] Fax: [phone]  CC: Referring Provider

## 2016-04-17 NOTE — Telephone Encounter (Signed)
His appt has been scheduled on 05/02/16 - arrival time 11:45am.  Called [facility] [place] [phone]) and spoke to [place] who took down the appt information.

## 2016-04-23 ENCOUNTER — Emergency Department: Payer: Medicare Other

## 2016-04-23 ENCOUNTER — Encounter: Payer: Self-pay | Admitting: Emergency Medicine

## 2016-04-23 ENCOUNTER — Emergency Department
Admission: EM | Admit: 2016-04-23 | Discharge: 2016-04-23 | Disposition: A | Discharge status: Home / Self Care | Payer: Medicare Other | Attending: Student in an Organized Health Care Education/Training Program | Admitting: Student in an Organized Health Care Education/Training Program

## 2016-04-23 DIAGNOSIS — R200 Anesthesia of skin: Secondary | ICD-10-CM | POA: Diagnosis not present

## 2016-04-23 DIAGNOSIS — I10 Essential (primary) hypertension: Secondary | ICD-10-CM | POA: Insufficient documentation

## 2016-04-23 DIAGNOSIS — R791 Abnormal coagulation profile: Secondary | ICD-10-CM | POA: Diagnosis not present

## 2016-04-23 DIAGNOSIS — F17210 Nicotine dependence, cigarettes, uncomplicated: Secondary | ICD-10-CM | POA: Insufficient documentation

## 2016-04-23 DIAGNOSIS — Z7982 Long term (current) use of aspirin: Secondary | ICD-10-CM | POA: Insufficient documentation

## 2016-04-23 DIAGNOSIS — Z79899 Other long term (current) drug therapy: Secondary | ICD-10-CM | POA: Insufficient documentation

## 2016-04-23 LAB — COMPREHENSIVE METABOLIC PANEL
ALK PHOS: 101 U/L (ref 38–126)
ALT: 23 U/L (ref 17–63)
ANION GAP: 8 (ref 5–15)
AST: 25 U/L (ref 15–41)
Albumin: 3.6 g/dL (ref 3.5–5.0)
BILIRUBIN TOTAL: 0.5 mg/dL (ref 0.3–1.2)
BUN: 19 mg/dL (ref 6–20)
CALCIUM: 9.2 mg/dL (ref 8.9–10.3)
CO2: 23 mmol/L (ref 22–32)
Chloride: 110 mmol/L (ref 101–111)
Creatinine, Ser: 1.13 mg/dL (ref 0.61–1.24)
GLUCOSE: 174 mg/dL — AB (ref 65–99)
POTASSIUM: 3.8 mmol/L (ref 3.5–5.1)
Sodium: 141 mmol/L (ref 135–145)
TOTAL PROTEIN: 7.2 g/dL (ref 6.5–8.1)

## 2016-04-23 LAB — DIFFERENTIAL
Basophils Absolute: 0.1 10*3/uL (ref 0–0.1)
Basophils Relative: 1 %
EOS ABS: 0.7 10*3/uL (ref 0–0.7)
Eosinophils Relative: 7 %
LYMPHS ABS: 2.6 10*3/uL (ref 1.0–3.6)
LYMPHS PCT: 28 %
MONO ABS: 1 10*3/uL (ref 0.2–1.0)
MONOS PCT: 11 %
NEUTROS PCT: 53 %
Neutro Abs: 5.1 10*3/uL (ref 1.4–6.5)

## 2016-04-23 LAB — CBC
HEMATOCRIT: 48.7 % (ref 40.0–52.0)
HEMOGLOBIN: 16.4 g/dL (ref 13.0–18.0)
MCH: 32.9 pg (ref 26.0–34.0)
MCHC: 33.6 g/dL (ref 32.0–36.0)
MCV: 97.7 fL (ref 80.0–100.0)
Platelets: 242 10*3/uL (ref 150–440)
RBC: 4.98 MIL/uL (ref 4.40–5.90)
RDW: 15.2 % — ABNORMAL HIGH (ref 11.5–14.5)
WBC: 9.5 10*3/uL (ref 3.8–10.6)

## 2016-04-23 LAB — PROTIME-INR
INR: 0.93
Prothrombin Time: 12.5 seconds (ref 11.4–15.2)

## 2016-04-23 LAB — TROPONIN I

## 2016-04-23 LAB — APTT: aPTT: 32 seconds (ref 24–36)

## 2016-04-23 MED ORDER — SODIUM CHLORIDE 0.9 % IV BOLUS (SEPSIS): 500.0000 mL | Freq: Once | INTRAVENOUS | Status: DC

## 2016-04-23 MED ORDER — ACETAMINOPHEN 500 MG PO TABS
1000.0000 mg | ORAL_TABLET | Freq: Once | ORAL | Status: AC
  Administered 2016-04-23: 1000 mg via ORAL
  Filled 2016-04-23: qty 2

## 2016-04-23 NOTE — ED Notes (Signed)
Pt provided urinal.

## 2016-04-23 NOTE — ED Notes (Signed)
Pt calling [employer] cab company to drive him back to [place] of [address]

## 2016-04-23 NOTE — ED Notes (Signed)
Pt called out stating that he "wasn't receiving any treatment and I want to leave." Pt informed that blood work was done and CT scan was performed, that pt received tylenol and refused fluids. Pt still stating he wants to leave. Dr. Roxan Hockey informed. Pt states he is going to call a cab.

## 2016-04-23 NOTE — ED Triage Notes (Signed)
Patient to ER from the [facility] via [facility] for c/o left sided numbness that started approx 1 hour ago. Patient reports posterior headache (no worse than typical headache). Patient with no neuro deficits at this time.

## 2016-04-23 NOTE — Discharge Instructions (Signed)
Please return to the ER for any additional concerns. Follow up with her PCP. Continue taking her medications as prescribed.

## 2016-04-23 NOTE — ED Provider Notes (Signed)
[facility] Emergency Department Provider Note    None    (approximate)  I have reviewed the triage vital signs and the nursing notes.   HISTORY  Chief Complaint Numbness    HPI Ran Tullis is a 66 y.o. male history of previous CVA presents to the ER with complaining of left-sided tingling and numbness that started roughly 1 hour prior to arrival. Patient with multiple presentations for the same. No new numbness or tingling. Denies any chest pain. No neck pain. Does have a mild headache. This is not the worse headache of his life.Denies any nausea or vomiting. No weakness.   Past Medical History:  Diagnosis Date  . Anxiety   . Depression   . Hyperlipemia   . Hypertension   . Korsakoff disease   . Nicotine abuse   . Osteomyelitis (HCC)   . PVD (peripheral vascular disease) (HCC)   . Stroke [facility])    Family History  Problem Relation Age of Onset  . Hypertension Mother   . Other Mother     brain bleed from accidental fall on ice - age 58  . Diabetes Father    Past Surgical History:  Procedure Laterality Date  . BELOW KNEE LEG AMPUTATION Bilateral   . FINGER SURGERY     left index finger - as a child   Patient Active Problem List   Diagnosis Date Noted  . Numbness and tingling in left hand 04/17/2016  . CVA (cerebral vascular accident) (HCC) 02/16/2016  . TIA (transient ischemic attack) 01/04/2016  . 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 Authorizing Provider  ARIPiprazole (ABILIFY) 10 MG tablet Take 10 mg by mouth daily.  06/09/13   Historical Provider, MD  aspirin EC 81 MG EC tablet Take 1 tablet (81 mg total) by mouth daily. 02/18/16   Ramonita Lab, MD  atorvastatin (LIPITOR) 40 MG tablet Take 1 tablet (40 mg total) by mouth daily at 6 PM. Patient taking differently: Take 40 mg by  mouth every evening.  01/05/16   Shaune Pollack, MD  butalbital-acetaminophen-caffeine (FIORICET, ESGIC) [phone] MG tablet Take 1 tablet by mouth every 8 (eight) hours as needed for headache.    Historical Provider, MD  clopidogrel (PLAVIX) 75 MG tablet Take 1 tablet (75 mg total) by mouth daily. 02/18/16   Ramonita Lab, MD  DULoxetine (CYMBALTA) 60 MG capsule Take 60 mg by mouth daily.    Historical Provider, MD  folic acid (FOLVITE) 1 MG tablet Take 1 mg by mouth daily.    Historical Provider, MD  gabapentin (NEURONTIN) 100 MG capsule Take 100 mg by mouth 2 (two) times daily.    Historical Provider, MD  ibuprofen (ADVIL,MOTRIN) 200 MG tablet Take 200 mg by mouth every 6 (six) hours as needed.    Historical Provider, MD  thiamine (VITAMIN B-1) 100 MG tablet Take 100 mg by mouth 2 (two) times daily.    Historical Provider, MD    Allergies Patient has no known allergies.    Social History Social History  Substance Use Topics  . Smoking status: Current Every Day Smoker    Packs/day: 1.00    Years: 20.00    Types: Cigarettes  . Smokeless tobacco: Never Used  . Alcohol use No    Review of Systems Patient denies headaches, rhinorrhea, blurry vision, numbness, shortness of breath,  chest pain, edema, cough, abdominal pain, nausea, vomiting, diarrhea, dysuria, fevers, rashes or hallucinations unless otherwise stated above in HPI. ____________________________________________   PHYSICAL EXAM:  VITAL SIGNS: Vitals:   04/23/16 1924  BP: (!) 123/94  Pulse: (!) 113  Resp: 17  Temp: 98.3 F (36.8 C)    Constitutional: Alert and oriented. Well appearing and in no acute distress. Eyes: Conjunctivae are normal. PERRL. EOMI. Head: Atraumatic. Nose: No congestion/rhinnorhea. Mouth/Throat: Mucous membranes are moist.  Oropharynx non-erythematous. Neck: No stridor. Painless ROM. No cervical spine tenderness to palpation Hematological/Lymphatic/Immunilogical: No cervical  lymphadenopathy. Cardiovascular: Normal rate, regular rhythm. Grossly normal heart sounds.  Good peripheral circulation. Respiratory: Normal respiratory effort.  No retractions. Lungs CTAB. Gastrointestinal: Soft and nontender. No distention. No abdominal bruits. No CVA tenderness. Musculoskeletal: s/p BKA bilaterally Neurologic:  Normal speech and language. No gross focal neurologic deficits are appreciated. No facial droop. CNI, normal FNF Skin:  Skin is warm, dry and intact. No rash noted. Psychiatric: Mood and affect are normal. Speech and behavior are normal.  ____________________________________________   LABS (all labs ordered are listed, but only abnormal results are displayed)  Results for orders placed or performed during the hospital encounter of 04/23/16 (from the past 24 hour(s))  Protime-INR     Status: None   Collection Time: 04/23/16  7:27 PM  Result Value Ref Range   Prothrombin Time 12.5 11.4 - 15.2 seconds   INR 0.93   APTT     Status: None   Collection Time: 04/23/16  7:27 PM  Result Value Ref Range   aPTT 32 24 - 36 seconds  CBC     Status: Abnormal   Collection Time: 04/23/16  7:27 PM  Result Value Ref Range   WBC 9.5 3.8 - 10.6 K/uL   RBC 4.98 4.40 - 5.90 MIL/uL   Hemoglobin 16.4 13.0 - 18.0 g/dL   HCT 04.5 40.9 - 81.1 %   MCV 97.7 80.0 - 100.0 fL   MCH 32.9 26.0 - 34.0 pg   MCHC 33.6 32.0 - 36.0 g/dL   RDW 91.4 (H) 78.2 - 95.6 %   Platelets 242 150 - 440 K/uL  Differential     Status: None   Collection Time: 04/23/16  7:27 PM  Result Value Ref Range   Neutrophils Relative % 53 %   Neutro Abs 5.1 1.4 - 6.5 K/uL   Lymphocytes Relative 28 %   Lymphs Abs 2.6 1.0 - 3.6 K/uL   Monocytes Relative 11 %   Monocytes Absolute 1.0 0.2 - 1.0 K/uL   Eosinophils Relative 7 %   Eosinophils Absolute 0.7 0 - 0.7 K/uL   Basophils Relative 1 %   Basophils Absolute 0.1 0 - 0.1 K/uL   ____________________________________________  EKG My review and personal  interpretation at Time: 19:23   Indication: sob  Rate: 110  Rhythm: sinus Axis: normal Other: rbbb no stemi, no st depressions, normal intervals ____________________________________________  RADIOLOGY  I personally reviewed all radiographic images ordered to evaluate for the above acute complaints and reviewed radiology reports and findings.  These findings were personally discussed with the patient.  Please see medical record for radiology report.  ____________________________________________   PROCEDURES  Procedure(s) performed:  Procedures    Critical Care performed: no ____________________________________________   INITIAL IMPRESSION / ASSESSMENT AND PLAN / ED COURSE  Pertinent labs & imaging results that were available during my care of the patient were reviewed by me and considered in my medical decision making (see chart  for details).  DDX: cva, tia, paresthesia, anxiety  Arther Heisler is a 66 y.o. who presents to the ED with chronic left upper extremity numbness and tingling presents for the same today. Patient otherwise hemodynamic stable. No evidence of infectious process. CT head with no acute abnormalities.  EKG at baseline. Patient states symptoms are consistent with previous episodes. He is ready medically optimized with aspirin and Plavix. Do not feel this represents an acute change.  Patient states he wants to be discharged back to facility.  Do feel patient is stable for discharge back to facility. Discussed signs and symptoms for which she should return.      ____________________________________________   FINAL CLINICAL IMPRESSION(S) / ED DIAGNOSES  Final diagnoses:  Numbness      NEW MEDICATIONS STARTED DURING THIS VISIT:  New Prescriptions   No medications on file     Note:  This document was prepared using Dragon voice recognition software and may include unintentional dictation errors.    Willy Eddy, MD 04/23/16 2050

## 2016-05-02 ENCOUNTER — Ambulatory Visit (INDEPENDENT_AMBULATORY_CARE_PROVIDER_SITE_OTHER): Payer: Medicare Other | Admitting: Neurology

## 2016-05-02 ENCOUNTER — Encounter (INDEPENDENT_AMBULATORY_CARE_PROVIDER_SITE_OTHER): Payer: Self-pay | Admitting: Neurology

## 2016-05-02 DIAGNOSIS — R202 Paresthesia of skin: Secondary | ICD-10-CM | POA: Diagnosis not present

## 2016-05-02 DIAGNOSIS — Z0289 Encounter for other administrative examinations: Secondary | ICD-10-CM

## 2016-05-02 DIAGNOSIS — R200 Anesthesia of skin: Secondary | ICD-10-CM

## 2016-05-02 NOTE — Procedures (Signed)
Full Name: Maxwell Webb Gender: Male MRN #: [id number] Date of Birth: [birth year]    Visit Date: 05/02/2016 13:16 Age: 66 Years 10 Months Old Examining Physician: Levert Feinstein, MD  Referring Physician: Terrace Arabia, MD    Conclusion:     ------------------------------- Physician Name, M.D.  [facility] [address] [place], [place] [address] Tel: [phone] Fax: [phone]        [facility]    Nerve / Sites Rec. Site Latency Ref. Amplitude Ref. Rel Amp Segments Distance Velocity Ref. Area    ms ms mV mV %  cm m/s m/s mVms  R Median - APB     Wrist APB 5.6 ?4.4 6.1 ?4.0 100 Wrist - APB 7   17.9     Upper arm APB 11.2  5.1  83.5 Upper arm - Wrist 28 50 ?49 15.4  L Median - APB     Wrist APB 5.9 ?4.4 5.4 ?4.0 100 Wrist - APB 7   23.9     Upper arm APB 11.6  4.7  88.5 Upper arm - Wrist 28 49 ?49 21.9  R Ulnar - ADM     Wrist ADM 4.0 ?3.3 7.1 ?6.0 100 Wrist - ADM 7   23.1     B.Elbow ADM 8.6  6.4  89.6 B.Elbow - Wrist 24 52 ?49 21.1     A.Elbow ADM 11.4  4.9  76.9 A.Elbow - B.Elbow 10 36 ?49 18.3         A.Elbow - Wrist      L Ulnar - ADM     Wrist ADM 3.6 ?3.3 6.7 ?6.0 100 Wrist - ADM 7   19.5     B.Elbow ADM 8.5  5.2  77.9 B.Elbow - Wrist 24 50 ?49 16.3     A.Elbow ADM 12.1  4.2  81.1 A.Elbow - B.Elbow 12 33 ?49 14.8         A.Elbow - Wrist                 SNC    Nerve / Sites Rec. Site Peak Lat Ref.  Amp Ref. Segments Distance    ms ms V V  cm  R Median - Orthodromic (Dig II, Mid palm)     Dig II Wrist 4.53 ?3.40 6 ?10 Dig II - Wrist 13  L Median - Orthodromic (Dig II, Mid palm)     Dig II Wrist 5.16 ?3.40 5 ?10 Dig II - Wrist 13  R Ulnar - Orthodromic, (Dig V, Mid palm)     Dig V Wrist 3.65 ?3.10 5 ?5 Dig V - Wrist 11  L Ulnar - Orthodromic, (Dig V, Mid palm)     Dig V Wrist NR ?3.10 NR ?5 Dig V - Wrist 43             F  Wave    Nerve F Lat Ref.   ms ms  R Ulnar - ADM 37.3 ?32.0  L Ulnar - ADM 37.2 ?32.0         EMG full       EMG Summary  Table    Spontaneous MUAP Recruitment  Muscle IA Fib PSW Fasc Other Amp Dur. Poly Pattern  R. First dorsal interosseous Normal None None None _______ Normal Normal Normal Reduced  R. Abductor pollicis brevis Normal None None None _______ Normal Normal Normal Reduced  R. Pronator teres Normal None None None _______ Normal Normal Normal Normal  R.  Biceps brachii Normal None None None _______ Normal Normal Normal Normal  R. Deltoid Normal None None None _______ Normal Normal Normal Normal  R. Triceps brachii Normal None None None _______ Normal Normal Normal Normal  L. First dorsal interosseous Normal None None None _______ Normal Normal Normal Reduced  L. Abductor pollicis brevis Normal None None None _______ Normal Normal Normal Normal  L. Pronator teres Normal None None None _______ Normal Normal Normal Normal  L. Biceps brachii Normal None None None _______ Normal Normal Normal Normal  L. Deltoid Normal None None None _______ Normal Normal Normal Normal  L. Triceps brachii Normal None None None _______ Normal Normal Normal Normal  R. Cervical paraspinals Normal None None None _______ Normal Normal Normal Normal  L. Cervical paraspinals Normal None None None _______ Normal Normal Normal Normal  R. Flexor carpi ulnaris Normal None None None _______ Normal Normal Normal Reduced  L. 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  Summary of the test: Nerve conduction study: Bilateral median sensory responses showed moderately prolonged peak latency,  with mildly decreased snap amplitude. Right ulnar sensory response showed mildly prolonged peak latency, within normal range snap amplitude. Left ulnar sensory response was absent.  Bilateral median motor responses showed moderately prolonged distal latency, with normal C map amplitude, conduction velocity. Bilateral ulnar motor responses showed mild to moderately prolonged distal latency, there was 23% drop of amplitude across right elbow; 19% amplitude drop across left elbow, bilateral ulnar F wave latency was moderately prolonged,  Electromyography: Selected needle examination was performed at bilateral upper extremity, bilateral cervical paraspinal muscles.  There is evidence of chronic neuropathic changes involving bilateral ulnar innervated muscles, first dorsal interossei, flexor carpi ulnaris, there is no evidence of axonal loss.  There is also evidence of mild chronic neuropathic changes involving bilateral abductor pollicis brevis, there is no evidence of active axonal loss.   Conclusion: This is an abnormal study. 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 There is no evidence of cervical radiculopathy.    ------------------------------- Levert Feinstein M.D.  [facility] [address] [place], [place] [address] Tel: [phone] Fax: [phone]        [facility]    Nerve / Sites Rec. Site Latency Ref. Amplitude Ref. Rel Amp Segments Distance Velocity Ref. Area    ms ms mV mV %  cm m/s m/s mVms  R Median - APB     Wrist APB 5.6 ?4.4 6.1 ?4.0 100 Wrist - APB 7   17.9     Upper arm APB 11.2  5.1  83.5 Upper arm - Wrist 28 50 ?49 15.4  L Median - APB     Wrist APB 5.9 ?4.4 5.4 ?4.0 100 Wrist - APB 7   23.9     Upper arm APB 11.6  4.7  88.5 Upper arm - Wrist 28 49 ?49 21.9  R Ulnar -  ADM      Wrist ADM 4.0 ?3.3 7.1 ?6.0 100 Wrist - ADM 7   23.1     B.Elbow ADM 8.6  6.4  89.6 B.Elbow - Wrist 24 52 ?49 21.1     A.Elbow ADM 11.4  4.9  76.9 A.Elbow - B.Elbow 10 36 ?49 18.3         A.Elbow - Wrist      L Ulnar - ADM     Wrist ADM 3.6 ?3.3 6.7 ?6.0 100 Wrist - ADM 7   19.5     B.Elbow ADM 8.5  5.2  77.9 B.Elbow - Wrist 24 50 ?49 16.3     A.Elbow ADM 12.1  4.2  81.1 A.Elbow - B.Elbow 12 33 ?49 14.8         A.Elbow - Wrist                 SNC    Nerve / Sites Rec. Site Peak Lat Ref.  Amp Ref. Segments Distance    ms ms V V  cm  R Median - Orthodromic (Dig II, Mid palm)     Dig II Wrist 4.53 ?3.40 6 ?10 Dig II - Wrist 13  L Median - Orthodromic (Dig II, Mid palm)     Dig II Wrist 5.16 ?3.40 5 ?10 Dig II - Wrist 13  R Ulnar - Orthodromic, (Dig V, Mid palm)     Dig V Wrist 3.65 ?3.10 5 ?5 Dig V - Wrist 11  L Ulnar - Orthodromic, (Dig V, Mid palm)     Dig V Wrist NR ?3.10 NR ?5 Dig V - Wrist 23             F  Wave    Nerve F Lat Ref.   ms ms  R Ulnar - ADM 37.3 ?32.0  L Ulnar - ADM 37.2 ?32.0         EMG full       EMG Summary Table    Spontaneous MUAP Recruitment  Muscle IA Fib PSW Fasc Other Amp Dur. Poly Pattern  R. First dorsal interosseous Normal None None None _______ Normal Normal Normal Reduced  R. Abductor pollicis brevis Normal None None None _______ Normal Normal Normal Reduced  R. Pronator teres Normal None None None _______ Normal Normal Normal Normal  R. Biceps brachii Normal None None None _______ Normal Normal Normal Normal  R. Deltoid Normal None None None _______ Normal Normal Normal Normal  R. Triceps brachii Normal None None None _______ Normal Normal Normal Normal  L. First dorsal interosseous Normal None None None _______ Normal Normal Normal Reduced  L. Abductor pollicis brevis Normal None None None _______ Normal Normal Normal Normal  L. Pronator teres Normal None None None _______ Normal Normal Normal Normal  L. Biceps brachii Normal None None  None _______ Normal Normal Normal Normal  L. Deltoid Normal None None None _______ Normal Normal Normal Normal  L. Triceps brachii Normal None None None _______ Normal Normal Normal Normal  R. Cervical paraspinals Normal None None None _______ Normal Normal Normal Normal  L. Cervical paraspinals Normal None None None _______ Normal Normal Normal Normal  R. Flexor carpi ulnaris Normal None None None _______ Normal Normal Normal Reduced  L. Flexor carpi ulnaris Normal None None None _______ Normal Normal Normal Reduced

## 2016-06-15 ENCOUNTER — Emergency Department
Admission: EM | Admit: 2016-06-15 | Discharge: 2016-06-15 | Disposition: A | Discharge status: Home / Self Care | Payer: Medicare Other | Attending: Emergency Medicine | Admitting: Emergency Medicine

## 2016-06-15 ENCOUNTER — Encounter: Payer: Self-pay | Admitting: Emergency Medicine

## 2016-06-15 ENCOUNTER — Emergency Department: Payer: Medicare Other

## 2016-06-15 DIAGNOSIS — I10 Essential (primary) hypertension: Secondary | ICD-10-CM | POA: Diagnosis not present

## 2016-06-15 DIAGNOSIS — F17210 Nicotine dependence, cigarettes, uncomplicated: Secondary | ICD-10-CM | POA: Insufficient documentation

## 2016-06-15 DIAGNOSIS — Z79899 Other long term (current) drug therapy: Secondary | ICD-10-CM | POA: Insufficient documentation

## 2016-06-15 DIAGNOSIS — Y939 Activity, unspecified: Secondary | ICD-10-CM | POA: Insufficient documentation

## 2016-06-15 DIAGNOSIS — S2231XA Fracture of one rib, right side, initial encounter for closed fracture: Secondary | ICD-10-CM | POA: Diagnosis not present

## 2016-06-15 DIAGNOSIS — Z7982 Long term (current) use of aspirin: Secondary | ICD-10-CM | POA: Diagnosis not present

## 2016-06-15 DIAGNOSIS — Y999 Unspecified external cause status: Secondary | ICD-10-CM | POA: Diagnosis not present

## 2016-06-15 DIAGNOSIS — S299XXA Unspecified injury of thorax, initial encounter: Secondary | ICD-10-CM | POA: Diagnosis present

## 2016-06-15 DIAGNOSIS — W06XXXA Fall from bed, initial encounter: Secondary | ICD-10-CM | POA: Insufficient documentation

## 2016-06-15 DIAGNOSIS — Y929 Unspecified place or not applicable: Secondary | ICD-10-CM | POA: Insufficient documentation

## 2016-06-15 MED ORDER — IBUPROFEN 600 MG PO TABS
600.0000 mg | ORAL_TABLET | Freq: Once | ORAL | Status: AC
  Administered 2016-06-15: 600 mg via ORAL
  Filled 2016-06-15: qty 1

## 2016-06-15 NOTE — ED Triage Notes (Signed)
[facility] , pt c/o right rib pain that started this morning. Pt states that he thinks he may have fell out of bed last night or the night before. No bruising noted to the area.   EMS states that they were at the facility picking up another patient and pt flagged them down stating he needed to be taken to ER for his rib pain.   Pt has no other complaints at this time. Breathing equal and unlabored, VSS, color WNL.

## 2016-06-15 NOTE — ED Notes (Signed)
Pt back from x-ray.

## 2016-06-15 NOTE — ED Notes (Signed)
Pt given lunch tray and is awaiting transport back to facility.

## 2016-06-15 NOTE — ED Notes (Signed)
ED Provider at bedside. 

## 2016-06-15 NOTE — ED Notes (Signed)
Maxwell Webb at [facility] states that there is no transport available back to facility. Report given to her.

## 2016-06-15 NOTE — ED Notes (Signed)
Pt alert and oriented X4, active, cooperative, pt in NAD. RR even and unlabored, color WNL.  Pt informed to return if any life threatening symptoms occur.   Pt left with [facility] to return to [facility]

## 2016-06-15 NOTE — ED Provider Notes (Signed)
[facility] Emergency Department Provider Note  ____________________________________________   First MD Initiated Contact with Patient 06/15/16 1113     (approximate)  I have reviewed the triage vital signs and the nursing notes.   HISTORY  Chief Complaint Chest Pain    HPI Maxwell Webb is a 66 y.o. male who comes to the emergency department via EMS with moderate severity right lower chest pain worse with taking a deep breath or moving improved with rest. He said his pain began last night when he woke up on the ground next to his bed. He has a tendency to roll out of bed at night and thinks he may have fallen and struck the right side of his chest. He is not called EMS but EMS was coming to his nursing home for another patient and he flagged them down asking to come to the hospital as well.   Past Medical History:  Diagnosis Date  . Anxiety   . Depression   . Hyperlipemia   . Hypertension   . Korsakoff disease   . Nicotine abuse   . Osteomyelitis (HCC)   . PVD (peripheral vascular disease) (HCC)   . Stroke [facility])     Patient Active Problem List   Diagnosis Date Noted  . Numbness and tingling in left hand 04/17/2016  . CVA (cerebral vascular accident) (HCC) 02/16/2016  . TIA (transient ischemic attack) 01/04/2016  . 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 BELOW KNEE LEG AMPUTATION Bilateral   . FINGER SURGERY     left index finger - as a child    Prior to Admission medications   Medication Sig Start Date End Date Taking? Authorizing Provider  acetaminophen (TYLENOL) 325 MG tablet Take 650 mg by mouth every 4 (four) hours as needed for mild pain, moderate pain or headache.   Yes [provider]  ARIPiprazole (ABILIFY) 10 MG tablet Take 10 mg by mouth daily.  06/09/13  Yes [provider]  aspirin EC  81 MG EC tablet Take 1 tablet (81 mg total) by mouth daily. 02/18/16  Yes Gouru, Deanna Artis, MD  atorvastatin (LIPITOR) 40 MG tablet Take 1 tablet (40 mg total) by mouth daily at 6 PM. Patient taking differently: Take 40 mg by mouth every evening.  01/05/16  Yes Shaune Pollack, MD  butalbital-acetaminophen-caffeine (FIORICET, ESGIC) [phone] MG tablet Take 1 tablet by mouth every 8 (eight) hours as needed for headache.   Yes [provider]  Cholecalciferol (VITAMIN D3) 50000 units CAPS Take 50,000 Units by mouth once a week.   Yes [provider]  clopidogrel (PLAVIX) 75 MG tablet Take 1 tablet (75 mg total) by mouth daily. 02/18/16  Yes Gouru, Deanna Artis, MD  DULoxetine (CYMBALTA) 30 MG capsule Take 30-60 mg by mouth 2 (two) times daily. Take 60 mg by mouth in the morning and take 30 mg by mouth at bedtime.   Yes [provider]  folic acid (FOLVITE) 1 MG tablet Take 1 mg by mouth daily.   Yes [provider]  gabapentin (NEURONTIN) 100 MG capsule Take 100 mg by mouth 2 (two) times daily.   Yes [provider]  ibuprofen (ADVIL,MOTRIN) 200 MG tablet Take 200 mg by mouth every 6 (six) hours as needed.   Yes [provider]  thiamine (VITAMIN B-1) 100 MG tablet Take 100 mg by mouth daily.  Yes [provider]    Allergies Patient has no known allergies.  Family History  Problem Relation Age of Onset  . Hypertension Mother   . Other Mother        brain bleed from accidental fall on ice - age 10  . Diabetes Father     Social History Social History  Substance Use Topics  . Smoking status: Current Every Day Smoker    Packs/day: 1.00    Years: 20.00    Types: Cigarettes  . Smokeless tobacco: Never Used  . Alcohol use No    Review of Systems Constitutional: No fever/chills Eyes: No visual changes. ENT: No sore throat. Cardiovascular: Positive chest pain. Respiratory: Denies shortness of breath. Gastrointestinal: No abdominal pain.  No  nausea, no vomiting.  No diarrhea.  No constipation. Genitourinary: Negative for dysuria. Musculoskeletal: Negative for back pain. Skin: Negative for rash. Neurological: Negative for headaches, focal weakness or numbness.   ____________________________________________   PHYSICAL EXAM:  VITAL SIGNS: ED Triage Vitals  Enc Vitals Group     BP 06/15/16 1024 101/85     Pulse Rate 06/15/16 1024 86     Resp 06/15/16 1024 15     Temp 06/15/16 1024 98.8 F (37.1 C)     Temp Source 06/15/16 1024 Oral     SpO2 06/15/16 1024 95 %     Weight 06/15/16 1019 200 lb 9.6 oz (91 kg)     Height --      Head Circumference --      Peak Flow --      Pain Score 06/15/16 1018 5     Pain Loc --      Pain Edu? --      Excl. in GC? --     Constitutional: Alert and oriented x 4 well appearing nontoxic no diaphoresis speaks in full, clear sentences Eyes: PERRL EOMI. Head: Atraumatic. Nose: No congestion/rhinnorhea. Mouth/Throat: No trismus Neck: No stridor.   Cardiovascular: Normal rate, regular rhythm. Grossly normal heart sounds.  Good peripheral circulation. Respiratory: Normal respiratory effort.  No retractions. Lungs CTAB and moving good airFocally tender along 10th rib on the right midaxillary line Gastrointestinal: Soft nontender Musculoskeletal: No lower extremity edema   Neurologic:  Normal speech and language. No gross focal neurologic deficits are appreciated. Skin:  Skin is warm, dry and intact. No rash noted. No ecchymosis at [place] Psychiatric: Mood and affect are normal. Speech and behavior are normal.    ____________________________________________   DIFFERENTIAL  Fracture, rib contusion, pneumothorax, hemothorax ____________________________________________   LABS (all labs ordered are listed, but only abnormal results are displayed)  Labs Reviewed - No data to display   __________________________________________  EKG  ED ECG REPORT I, Merrily Brittle, the attending  physician, personally viewed and interpreted this ECG.  Date: 06/15/2016 Rate: 83 Rhythm: normal sinus rhythm QRS Axis: normal Intervals: normal ST/T Wave abnormalities: normal Conduction Disturbances: Nonspecific intraventricular conduction delay Narrative Interpretation: Abnormal  ____________________________________________  RADIOLOGY  Chest x-ray with no fracture no pneumothorax no hemothorax ____________________________________________   PROCEDURES  Procedure(s) performed: no  Procedures  Critical Care performed: no  Observation: no ____________________________________________   INITIAL IMPRESSION / ASSESSMENT AND PLAN / ED COURSE  Pertinent labs & imaging results that were available during my care of the patient were reviewed by me and considered in my medical decision making (see chart for details).  The patient is very well-appearing with focal tenderness over his 10th rib on the right laterally. X-ray fortunately shows  no pneumothorax and no hemothorax and I explained to the patient that it is possible that he either has a broken or bruised rib but that regardless is symptomatic treatment. His pain is well controlled with NSAIDs so I will refer him back to his primary care physician.      ____________________________________________   FINAL CLINICAL IMPRESSION(S) / ED DIAGNOSES  Final diagnoses:  Closed fracture of one rib of right side, initial encounter      NEW MEDICATIONS STARTED DURING THIS VISIT:  New Prescriptions   No medications on file     Note:  This document was prepared using Dragon voice recognition software and may include unintentional dictation errors.     Merrily Brittleifenbark, Evalina Tabak, MD 06/15/16 1205

## 2016-06-15 NOTE — Discharge Instructions (Signed)
Fortunately today her x-ray was normal, that you either have a bruised rib or broken ribs. These are quite painful and the pain will be most intense for the first week or so. Please anticipate that your ribs will hurt for about 2-3 weeks. You can take over-the-counter medications as needed for the pain and follow-up with a primary care physician in about a week for recheck. Return to the emergency department sooner for any new or worsening symptoms such as if her pain is not controlled, for shortness of breath, or for any other concerns.  It was a pleasure to take care of you today, and thank you for coming to our emergency department.  If you have any questions or concerns before leaving please ask the nurse to grab me and I'm more than happy to go through your aftercare instructions again.  If you were prescribed any opioid pain medication today such as Norco, Vicodin, Percocet, morphine, hydrocodone, or oxycodone please make sure you do not drive when you are taking this medication as it can alter your ability to drive safely.  If you have any concerns once you are home that you are not improving or are in fact getting worse before you can make it to your follow-up appointment, please do not hesitate to call 911 and come back for further evaluation.  Merrily BrittleNeil Milos Milligan MD  Results for orders placed or performed during the hospital encounter of 04/23/16  Protime-INR  Result Value Ref Range   Prothrombin Time 12.5 11.4 - 15.2 seconds   INR 0.93   APTT  Result Value Ref Range   aPTT 32 24 - 36 seconds  CBC  Result Value Ref Range   WBC 9.5 3.8 - 10.6 K/uL   RBC 4.98 4.40 - 5.90 MIL/uL   Hemoglobin 16.4 13.0 - 18.0 g/dL   HCT 16.148.7 09.640.0 - 04.552.0 %   MCV 97.7 80.0 - 100.0 fL   MCH 32.9 26.0 - 34.0 pg   MCHC 33.6 32.0 - 36.0 g/dL   RDW 40.915.2 (H) 81.111.5 - 91.414.5 %   Platelets 242 150 - 440 K/uL  Differential  Result Value Ref Range   Neutrophils Relative % 53 %   Neutro Abs 5.1 1.4 - 6.5 K/uL   Lymphocytes Relative 28 %   Lymphs Abs 2.6 1.0 - 3.6 K/uL   Monocytes Relative 11 %   Monocytes Absolute 1.0 0.2 - 1.0 K/uL   Eosinophils Relative 7 %   Eosinophils Absolute 0.7 0 - 0.7 K/uL   Basophils Relative 1 %   Basophils Absolute 0.1 0 - 0.1 K/uL  Comprehensive metabolic panel  Result Value Ref Range   Sodium 141 135 - 145 mmol/L   Potassium 3.8 3.5 - 5.1 mmol/L   Chloride 110 101 - 111 mmol/L   CO2 23 22 - 32 mmol/L   Glucose, Bld 174 (H) 65 - 99 mg/dL   BUN 19 6 - 20 mg/dL   Creatinine, Ser 7.821.13 0.61 - 1.24 mg/dL   Calcium 9.2 8.9 - 95.610.3 mg/dL   Total Protein 7.2 6.5 - 8.1 g/dL   Albumin 3.6 3.5 - 5.0 g/dL   AST 25 15 - 41 U/L   ALT 23 17 - 63 U/L   Alkaline Phosphatase 101 38 - 126 U/L   Total Bilirubin 0.5 0.3 - 1.2 mg/dL   GFR calc non Af Amer >60 >60 mL/min   GFR calc Af Amer >60 >60 mL/min   Anion gap 8 5 - 15  Troponin I  Result Value Ref Range   Troponin I <0.03 <0.03 ng/mL   Dg Chest 2 View  Result Date: 06/15/2016 CLINICAL DATA: Right side rib pain status post fall EXAM: CHEST  2 VIEW COMPARISON:  None. FINDINGS: The heart size and mediastinal contours are within normal limits. Both lungs are clear. The visualized skeletal structures are unremarkable. IMPRESSION: No active cardiopulmonary disease. Electronically Signed   By: Elige Ko   On: 06/15/2016 11:48

## 2017-03-20 IMAGING — CT CT HEAD CODE STROKE
3 series · 15 of 47 positions shown, 18 images · non-contrast
Comparison: Head CT 02/05/2016 and earlier.

ADDENDUM:
Study discussed by telephone with Dr. GREGORIO OKEEFE on 02/16/2016
at 9929 hours.
CLINICAL DATA: Code stroke. 65-year-old male with left side
numbness since 5255 hours. Initial encounter.

EXAM:
CT HEAD WITHOUT CONTRAST
TECHNIQUE: Contiguous axial images were obtained from the base of the skull
through the vertex without intravenous contrast.

[Series 2: head wo · axial · 0.46mm/px · z∈[-144,-14]mm · 9 of 32 slices shown, 12 images]
[im 3/32  brain]
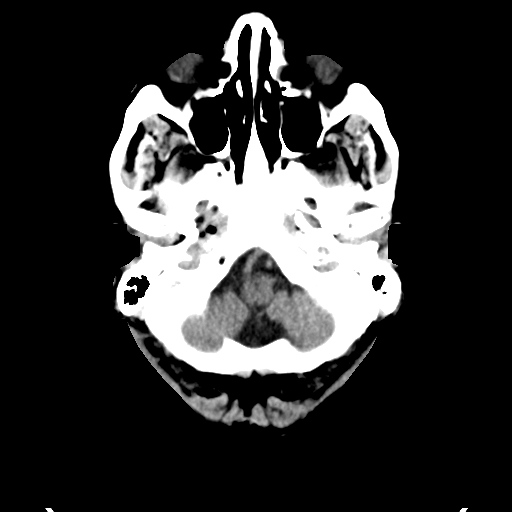
[im 3/32  bone]
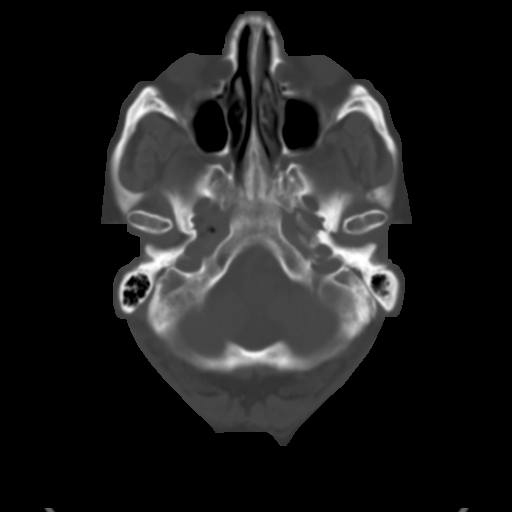
[im 6/32  brain]
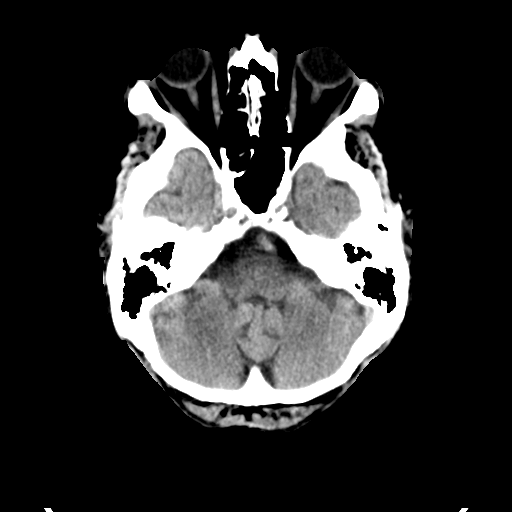
[im 9/32  brain]
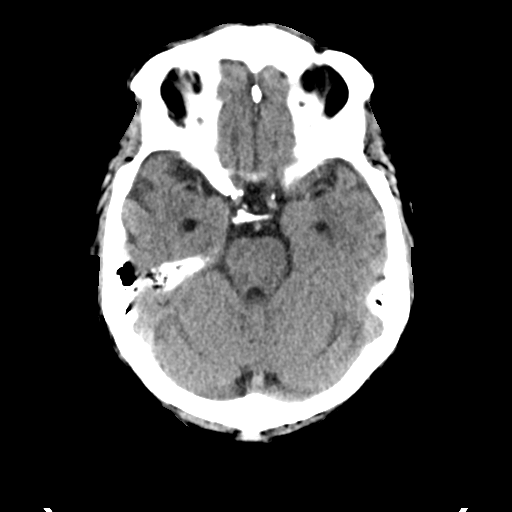
[im 12/32  brain]
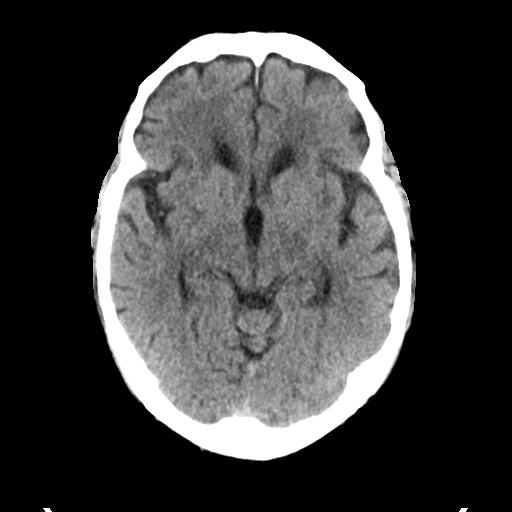
[im 17/32  brain]
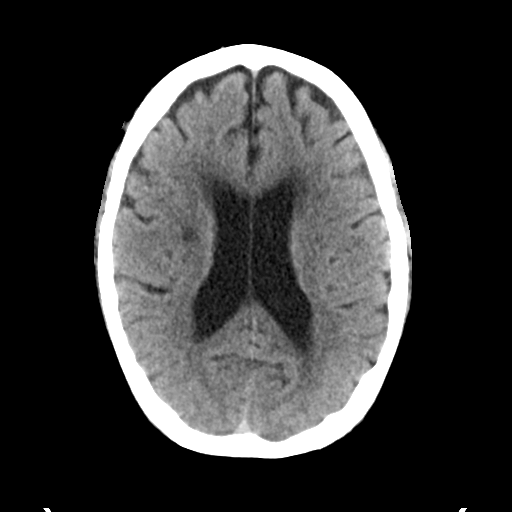
[im 17/32  bone]
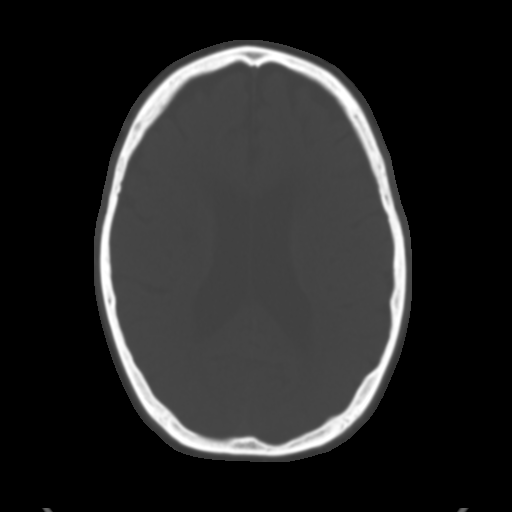
[im 20/32  brain]
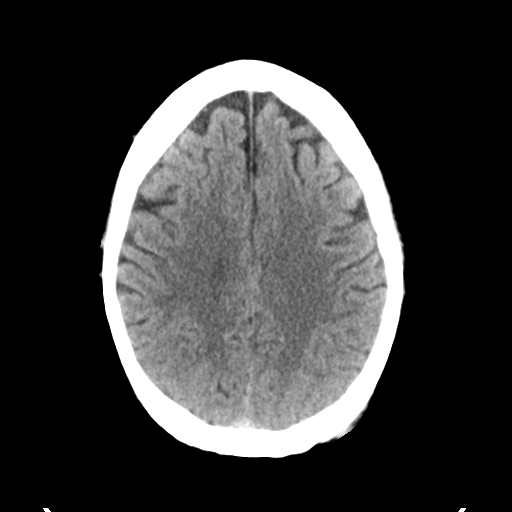
[im 23/32  brain]
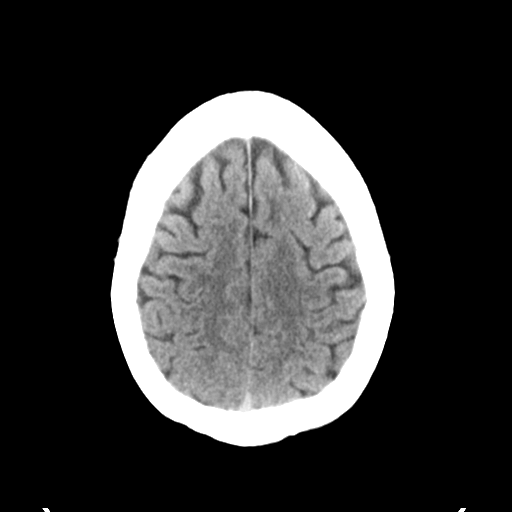
[im 26/32  brain]
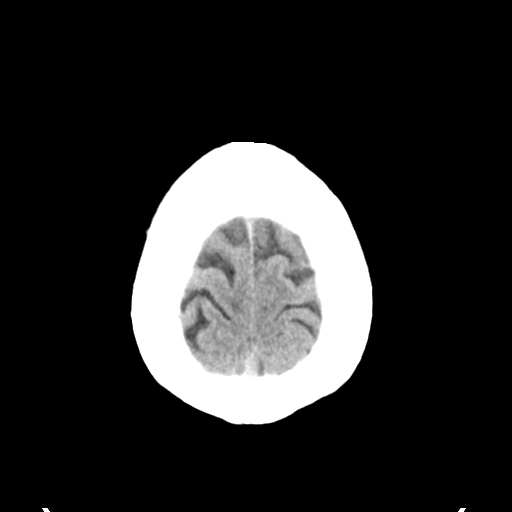
[im 29/32  brain]
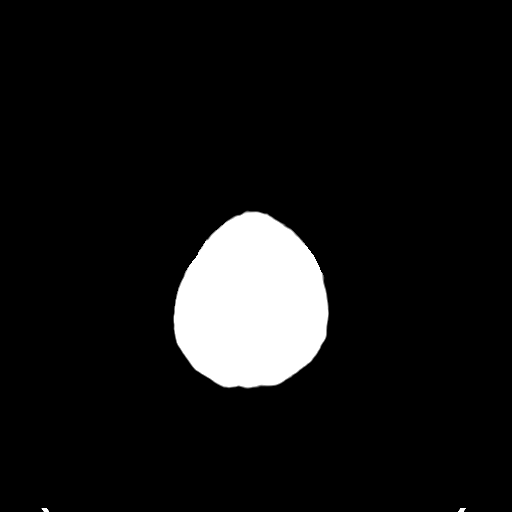
[im 29/32  bone]
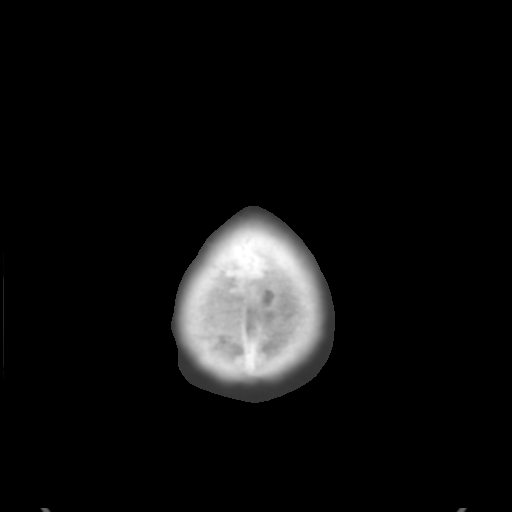

[Series 4: coronal soft tissue · coronal · 0.34mm/px · 3 of 74 slices shown]
[im 25/74  brain]
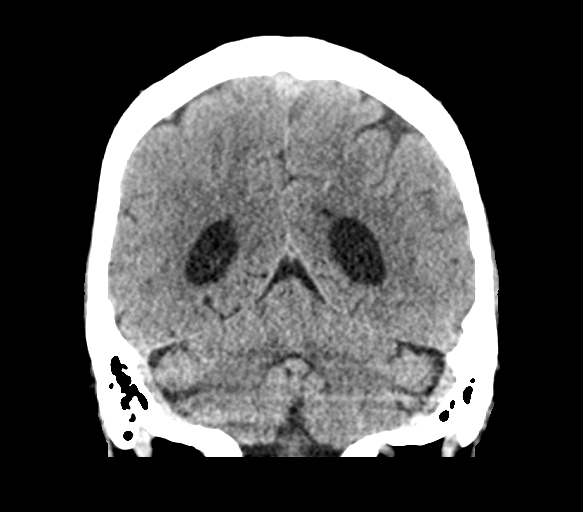
[im 33/74  brain]
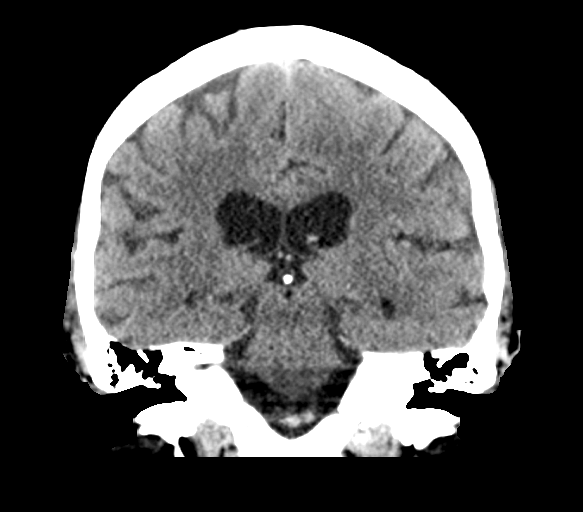
[im 41/74  brain]
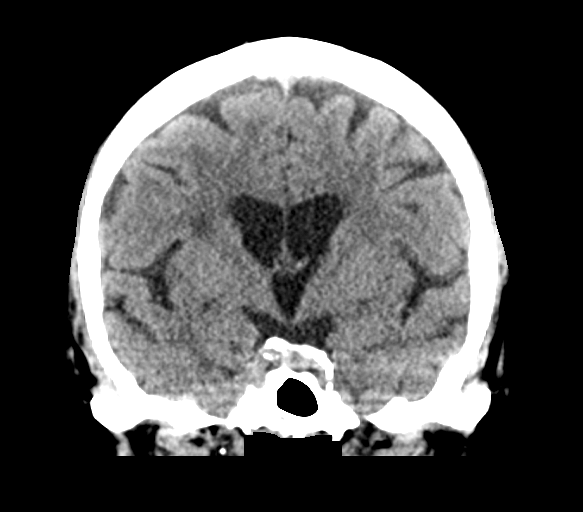

[Series 5: sagittal soft tissue · sagittal · 0.33mm/px · 3 of 62 slices shown]
[im 21/62  brain]
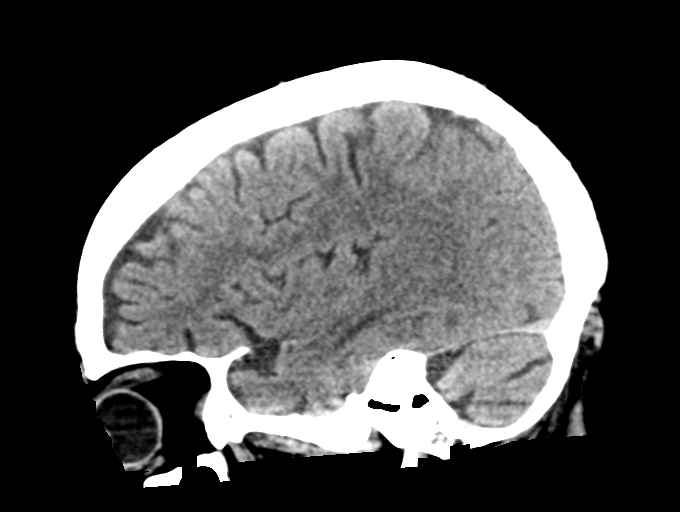
[im 31/62  brain]
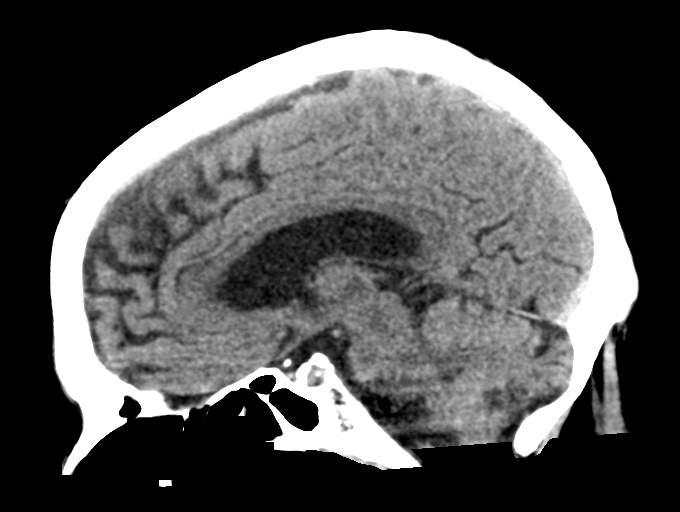
[im 41/62  brain]
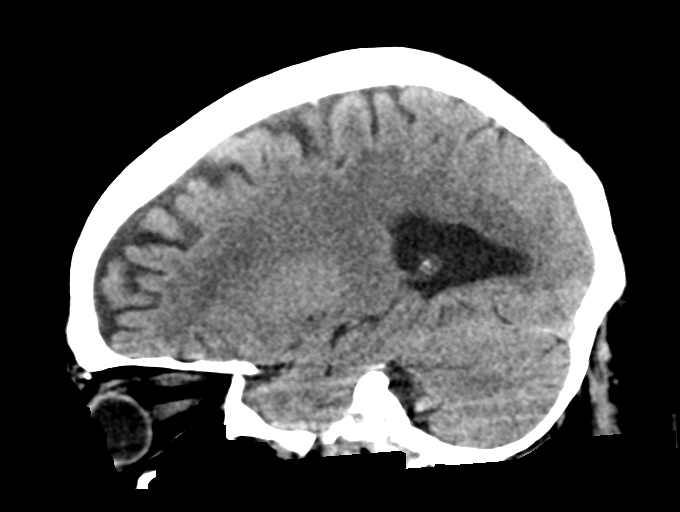

[15 of 47 positions shown; findings below may reference images not displayed]

FINDINGS: Brain: Patchy right greater than left white matter hypodensity
appears stable. Stable gray-white matter differentiation throughout
the brain. No midline shift, ventriculomegaly, mass effect, evidence
of mass lesion, intracranial hemorrhage or evidence of cortically
based acute infarction.

Vascular: Calcified atherosclerosis at the skull base.

Skull: No acute osseous abnormality identified.

Sinuses/Orbits: Mild mucosal thickening and bubbly opacity in the
right sphenoid sinus not significantly changed. Other Visualized
paranasal sinuses and mastoids are stable and well pneumatized.

Other: Negative orbit and scalp soft tissues.

ASPECTS (Alberta Stroke Program Early CT Score)

- Ganglionic level infarction (caudate, lentiform nuclei, internal
capsule, insula, M1-M3 cortex): 7

- Supraganglionic infarction (M4-M6 cortex): 3

Total score (0-10 with 10 being normal): 10
IMPRESSION: 1. Stable non contrast CT appearance of the brain since 02/05/2016.
Chronic white matter changes.
2. ASPECTS is 10.

## 2017-03-20 IMAGING — MR MR HEAD W/O CM
10 series · 47 of 48 positions shown · non-contrast
Comparison: 01/05/2016

CLINICAL DATA: Left arm weakness.  History of stroke.

EXAM:
MRI HEAD WITHOUT CONTRAST
TECHNIQUE: Multiplanar, multiecho pulse sequences of the brain and surrounding
structures were obtained without intravenous contrast.

[Series 2: GRE · sagittal · 5.0mm · 0.45mm/px · 4 of 25 slices shown (1 of 2)]
[im 1/25]
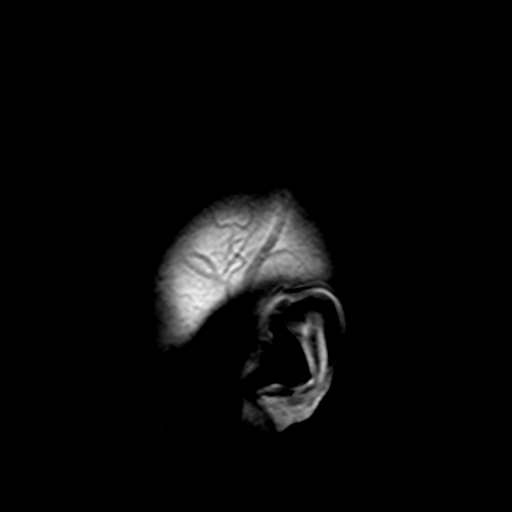
[im 9/25]
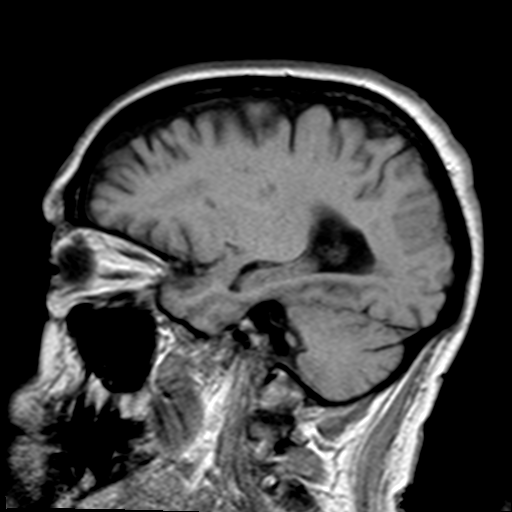
[im 17/25]
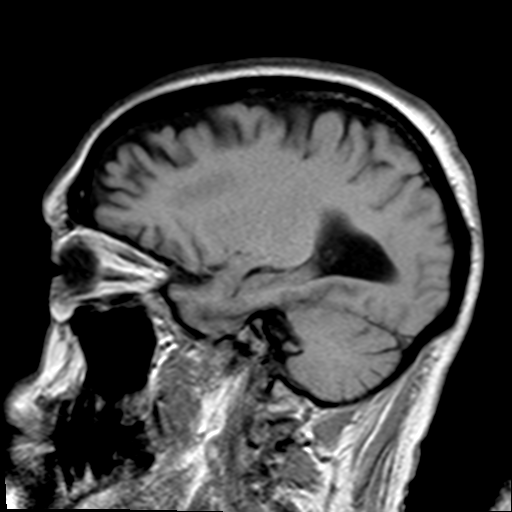
[im 25/25]
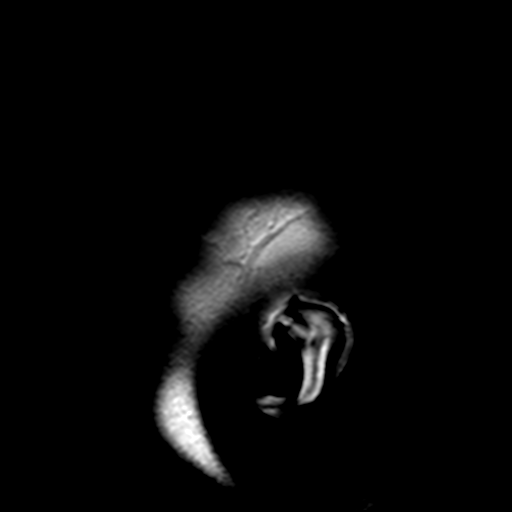

[Series 4: DWI · axial · 3.0mm · 1.80mm/px · z∈[-61,+99]mm · 7 of 54 slices shown (1 of 2)]
[im 1/54]
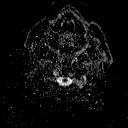
[im 9/54]
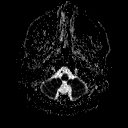
[im 18/54]
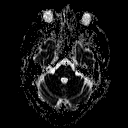
[im 27/54]
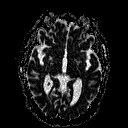
[im 36/54]
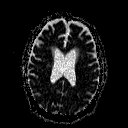
[im 45/54]
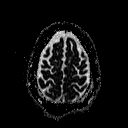
[im 54/54]
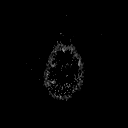

[Series 6: DWI · coronal · 3.0mm · 1.80mm/px · 6 of 49 slices shown (2 of 2)]
[im 1/49]
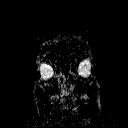
[im 10/49]
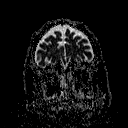
[im 20/49]
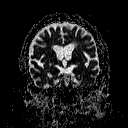
[im 29/49]
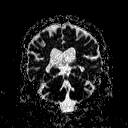
[im 39/49]
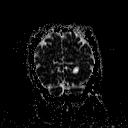
[im 49/49]
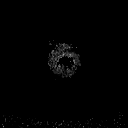

[Series 7: T2 · axial · 5.0mm · 0.45mm/px · z∈[-64,+103]mm · 4 of 27 slices shown (1 of 3)]
[im 1/27]
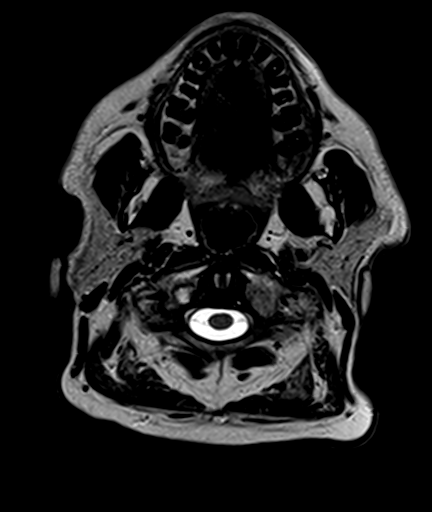
[im 9/27]
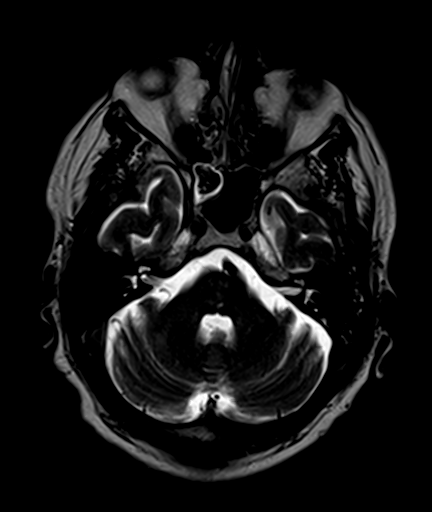
[im 18/27]
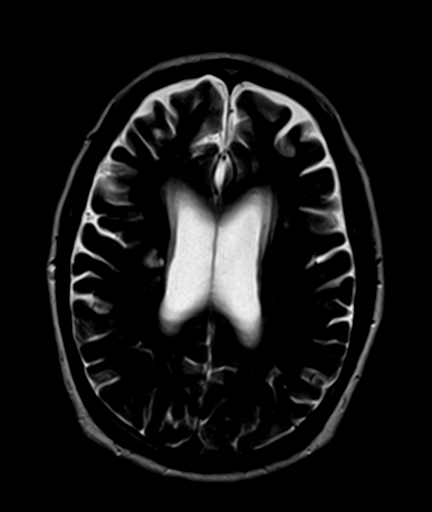
[im 27/27]
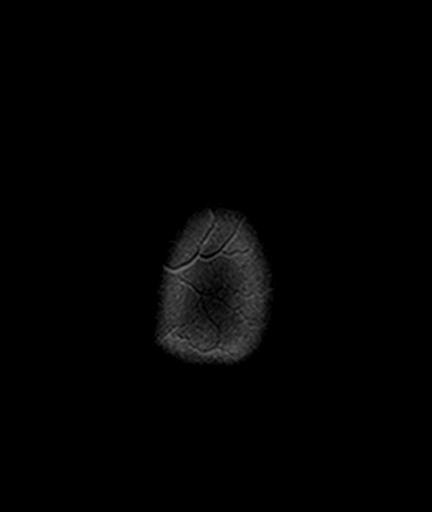

[Series 8: FLAIR · axial · 5.0mm · 0.45mm/px · z∈[-57,+97]mm · 3 of 25 slices shown]
[im 1/25]
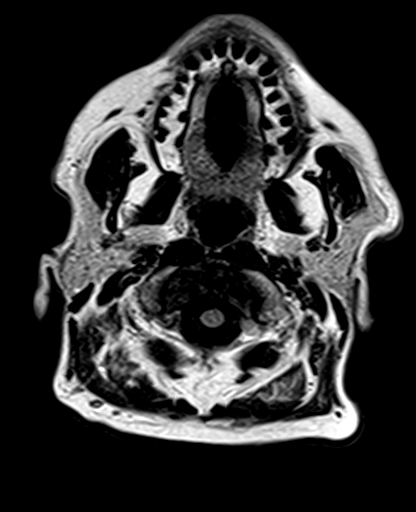
[im 13/25]
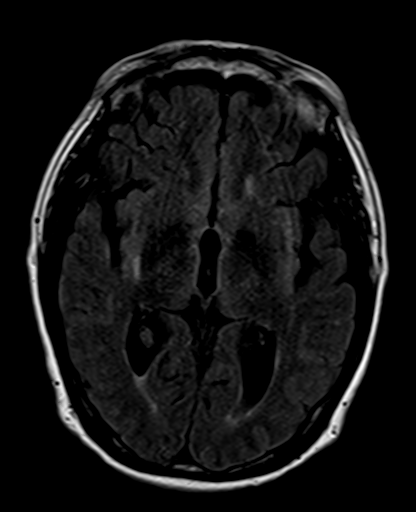
[im 25/25]
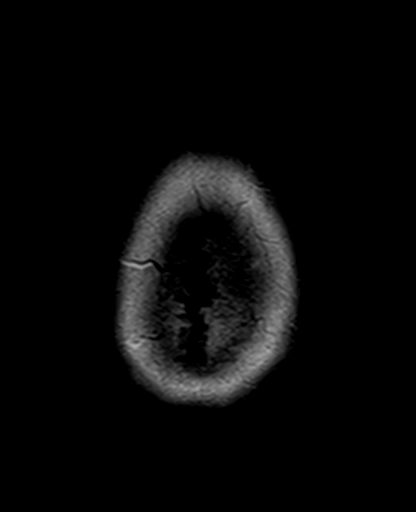

[Series 9: T2 · axial · 5.0mm · 1.20mm/px · z∈[-65,+102]mm · 4 of 27 slices shown (2 of 3)]
[im 1/27]
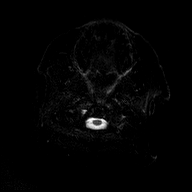
[im 9/27]
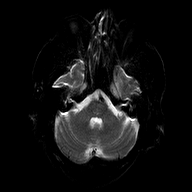
[im 18/27]
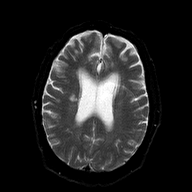
[im 27/27]
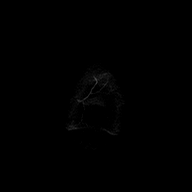

[Series 10: GRE · axial · 5.0mm · 0.45mm/px · z∈[-58,+96]mm · 3 of 25 slices shown (2 of 2)]
[im 1/25]
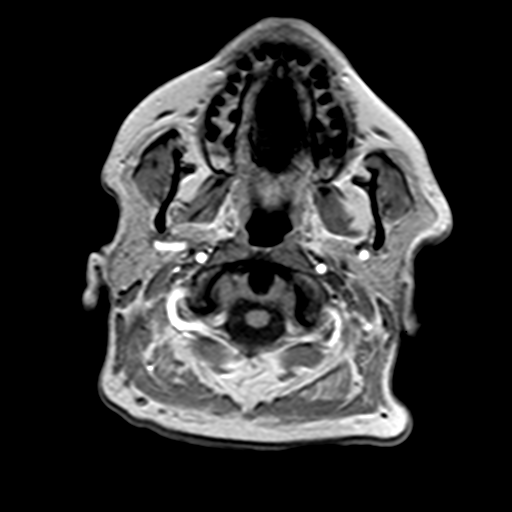
[im 13/25]
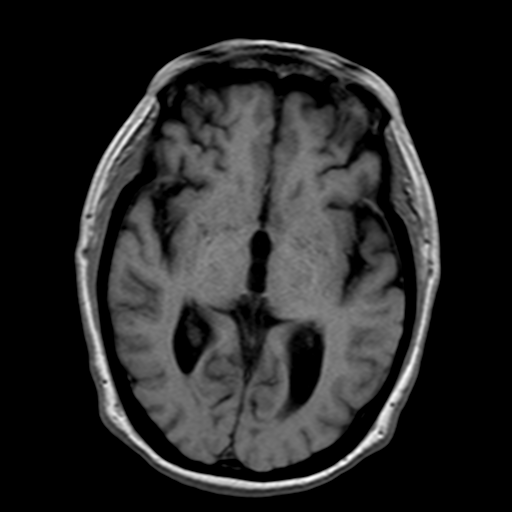
[im 25/25]
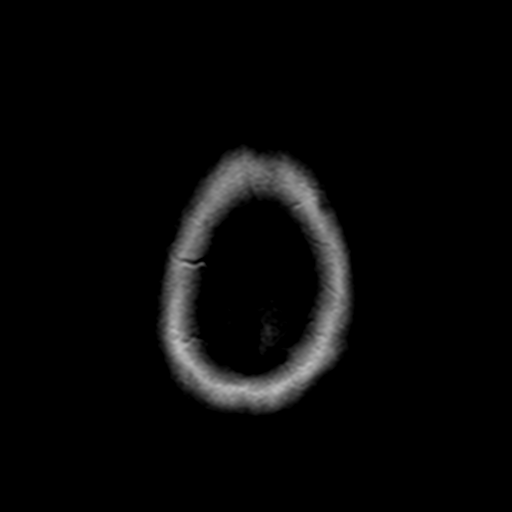

[Series 11: T2 · coronal · 5.0mm · 0.45mm/px · 4 of 27 slices shown (3 of 3)]
[im 1/27]
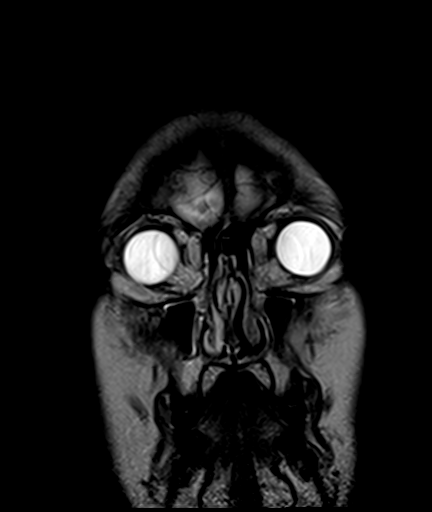
[im 9/27]
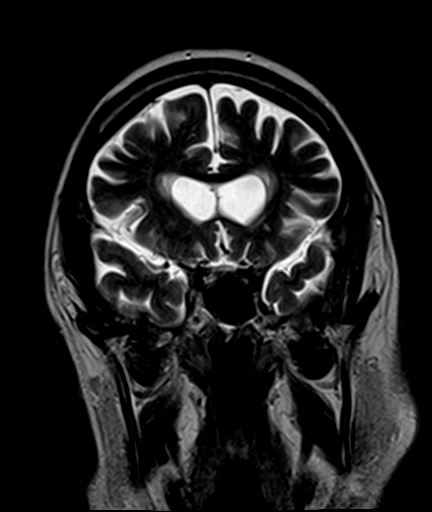
[im 18/27]
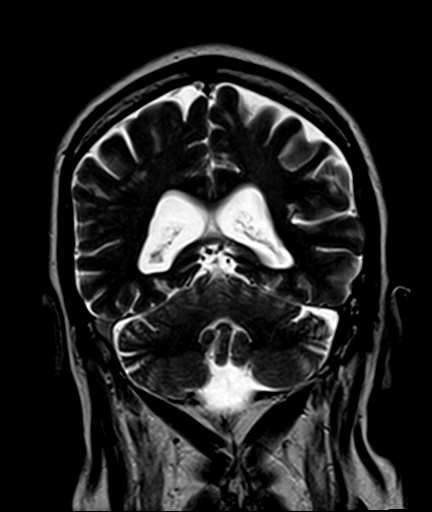
[im 27/27]
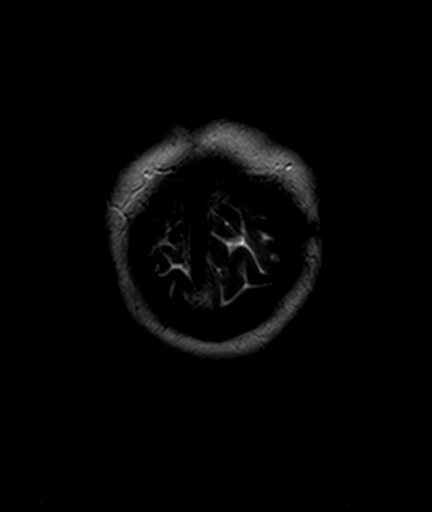

[Series 100: (id) · axial · 3.0mm · 1.80mm/px · z∈[-61,+99]mm · 7 of 55 slices shown]
[im 1/55]
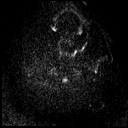
[im 10/55]
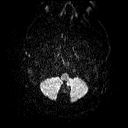
[im 19/55]
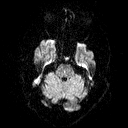
[im 28/55]
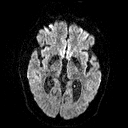
[im 37/55]
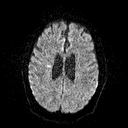
[im 46/55]
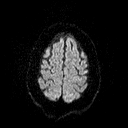
[im 55/55]
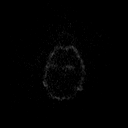

[Series 101: (id) cor · coronal · 3.0mm · 1.80mm/px · 5 of 49 slices shown]
[im 1/49]
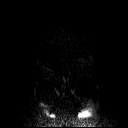
[im 10/49]
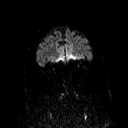
[im 20/49]
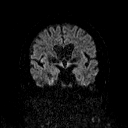
[im 29/49]
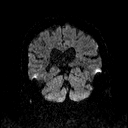
[im 39/49]
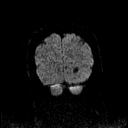

[47 of 48 positions shown; findings below may reference images not displayed]

FINDINGS: Brain: 1 cm focus of restricted diffusion in the anterior genu of
the corpus callosum, left para median. DWI hyperintensity in the
central pons and right corona radiata noted previously are less
intense.

Scattered white matter signal abnormalities in the cerebral
hemispheres consistent with chronic microvascular disease in this
patient with vascular risk factors. A thin confluent rim of gliosis
is present around the frontal horns of the lateral ventricles.
Normal brain volume for age. No hemorrhage, hydrocephalus, or mass.

Vascular: Preserved flow voids

Skull and upper cervical spine: Negative

Sinuses/Orbits: Mild patchy mucosal thickening, most notable in the
right sphenoid sinus where there is retained secretions.
IMPRESSION: 1. 1 cm acute infarct at the genu of the corpus callosum, left
paramedian.
2. Chronic microvascular disease.

## 2017-06-30 ENCOUNTER — Emergency Department
Admission: EM | Admit: 2017-06-30 | Discharge: 2017-06-30 | Disposition: A | Discharge status: Home / Self Care | Payer: Medicare Other | Attending: Student in an Organized Health Care Education/Training Program | Admitting: Student in an Organized Health Care Education/Training Program

## 2017-06-30 ENCOUNTER — Other Ambulatory Visit: Payer: Self-pay

## 2017-06-30 ENCOUNTER — Emergency Department: Payer: Medicare Other

## 2017-06-30 ENCOUNTER — Encounter: Payer: Self-pay | Admitting: *Deleted

## 2017-06-30 DIAGNOSIS — Z7982 Long term (current) use of aspirin: Secondary | ICD-10-CM | POA: Diagnosis not present

## 2017-06-30 DIAGNOSIS — F17210 Nicotine dependence, cigarettes, uncomplicated: Secondary | ICD-10-CM | POA: Diagnosis not present

## 2017-06-30 DIAGNOSIS — W050XXA Fall from non-moving wheelchair, initial encounter: Secondary | ICD-10-CM | POA: Insufficient documentation

## 2017-06-30 DIAGNOSIS — R197 Diarrhea, unspecified: Secondary | ICD-10-CM | POA: Diagnosis not present

## 2017-06-30 DIAGNOSIS — I10 Essential (primary) hypertension: Secondary | ICD-10-CM | POA: Insufficient documentation

## 2017-06-30 DIAGNOSIS — Z79899 Other long term (current) drug therapy: Secondary | ICD-10-CM | POA: Insufficient documentation

## 2017-06-30 DIAGNOSIS — R410 Disorientation, unspecified: Secondary | ICD-10-CM | POA: Insufficient documentation

## 2017-06-30 DIAGNOSIS — R8290 Unspecified abnormal findings in urine: Secondary | ICD-10-CM | POA: Diagnosis not present

## 2017-06-30 DIAGNOSIS — R51 Headache: Secondary | ICD-10-CM | POA: Insufficient documentation

## 2017-06-30 DIAGNOSIS — W19XXXA Unspecified fall, initial encounter: Secondary | ICD-10-CM

## 2017-06-30 LAB — URINE DRUG SCREEN, QUALITATIVE (ARMC ONLY)
AMPHETAMINES, UR SCREEN: NOT DETECTED
BENZODIAZEPINE, UR SCRN: NOT DETECTED
Cannabinoid 50 Ng, Ur ~~LOC~~: NOT DETECTED
Cocaine Metabolite,Ur ~~LOC~~: NOT DETECTED
MDMA (Ecstasy)Ur Screen: NOT DETECTED
METHADONE SCREEN, URINE: NOT DETECTED
OPIATE, UR SCREEN: NOT DETECTED
PHENCYCLIDINE (PCP) UR S: NOT DETECTED
Tricyclic, Ur Screen: NOT DETECTED

## 2017-06-30 LAB — URINALYSIS, COMPLETE (UACMP) WITH MICROSCOPIC
BACTERIA UA: NONE SEEN
BILIRUBIN URINE: NEGATIVE
GLUCOSE, UA: NEGATIVE mg/dL
Hgb urine dipstick: NEGATIVE
KETONES UR: NEGATIVE mg/dL
LEUKOCYTES UA: NEGATIVE
NITRITE: NEGATIVE
PH: 6 (ref 5.0–8.0)
PROTEIN: NEGATIVE mg/dL
SQUAMOUS EPITHELIAL / LPF: NONE SEEN (ref 0–5)
Specific Gravity, Urine: 1.012 (ref 1.005–1.030)

## 2017-06-30 LAB — BASIC METABOLIC PANEL
ANION GAP: 8 (ref 5–15)
BUN: 21 mg/dL — ABNORMAL HIGH (ref 6–20)
CO2: 23 mmol/L (ref 22–32)
Calcium: 9.1 mg/dL (ref 8.9–10.3)
Chloride: 106 mmol/L (ref 101–111)
Creatinine, Ser: 0.83 mg/dL (ref 0.61–1.24)
GFR calc non Af Amer: >60 mL/min (ref >60)
GLUCOSE: 109 mg/dL — AB (ref 65–99)
POTASSIUM: 4.3 mmol/L (ref 3.5–5.1)
Sodium: 137 mmol/L (ref 135–145)

## 2017-06-30 LAB — CBC
HEMATOCRIT: 48.8 % (ref 40.0–52.0)
HEMOGLOBIN: 16.8 g/dL (ref 13.0–18.0)
MCH: 33.7 pg (ref 26.0–34.0)
MCHC: 34.4 g/dL (ref 32.0–36.0)
MCV: 98 fL (ref 80.0–100.0)
Platelets: 227 10*3/uL (ref 150–440)
RBC: 4.98 MIL/uL (ref 4.40–5.90)
RDW: 13.7 % (ref 11.5–14.5)
WBC: 9.7 10*3/uL (ref 3.8–10.6)

## 2017-06-30 NOTE — ED Notes (Signed)
Call made to Albina BilletMeaghan Tyson, guardian at [phone] and left a message.

## 2017-06-30 NOTE — ED Notes (Signed)
Patient was encouraged and assisted to move back on the stretcher. Patient was close to the end of the stretcher. Patient was compliant with request. Back of the stretcher was raised up and pillow was placed for  Patient's comfort.

## 2017-06-30 NOTE — Discharge Instructions (Addendum)
Return for any additional worsening symptoms including any worsening headache, numbness or tingling, chest pain or shortness of breath.

## 2017-06-30 NOTE — ED Triage Notes (Signed)
Per EMS report, Patient had a witness fall out of his wheelchair and hit his head. Roommate witnessed the fall. Patient states he remembers the fall, but did not strike his head. Per EMS report, patient was being cleaned up of diarrhea and has a strong urine smell upon their arrival.

## 2017-06-30 NOTE — ED Notes (Signed)
Patient encouraged and assisted to sit back on the stretcher. Patient is compliant. Patient was getting closer to the end of the stretcher.

## 2017-06-30 NOTE — ED Provider Notes (Signed)
[facility] Emergency Department Provider Note    First MD Initiated Contact with Patient 06/30/17 1458     (approximate)  I have reviewed the triage vital signs and the nursing notes.   HISTORY  Chief Complaint Fall  Level V Caveat:  Chronic encephalopathy - korsakoff/cva  HPI Maxwell Webb is a 67 y.o. male presents to the ER from group home after the patient had a witnessed fall out of his wheelchair and did hit his head.  Is currently complaining of headache.  According EMS patient is a little bit more confused than normal and is reportedly been having diarrheal illness.  No report of melena.  Also does have foul-smelling urine.  I have seen this patient in the ER previously and he does seem slightly more confused than previous but is answer my questions appropriately.  His history seems to be waxing and waning was initially telling nursing that he did not hit his head but is telling me that he hit his head therefore will order CT imaging and based on his presentation will get labs to further evaluate.  Patient denies any other pain.  No nausea or vomiting.  He denies any chest pain or shortness of breath.    Past Medical History:  Diagnosis Date  . Anxiety   . Depression   . Hyperlipemia   . Hypertension   . Korsakoff disease   . Nicotine abuse   . Osteomyelitis (HCC)   . PVD (peripheral vascular disease) (HCC)   . Stroke [facility](HCC)    Family History  Problem Relation Age of Onset  . Hypertension Mother   . Other Mother        brain bleed from accidental fall on ice - age 65  . Diabetes Father    Past Surgical History:  Procedure Laterality Date  . BELOW KNEE LEG AMPUTATION Bilateral   . FINGER SURGERY     left index finger - as a child   Patient Active Problem List   Diagnosis Date Noted  . Numbness and tingling in left hand 04/17/2016  . CVA (cerebral vascular accident) (HCC) 02/16/2016  . TIA (transient ischemic attack) 01/04/2016  .  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 Authorizing Provider  acetaminophen (TYLENOL) 325 MG tablet Take 650 mg by mouth every 4 (four) hours as needed for mild pain, moderate pain or headache.   Yes [provider]  alum & mag hydroxide-simeth (MINTOX) 200-200-20 MG/5ML suspension Take 30 mLs by mouth 4 (four) times daily as needed for indigestion or heartburn.   Yes [provider]  ARIPiprazole (ABILIFY) 15 MG tablet Take 10 mg by mouth every evening.    Yes [provider]  aspirin EC 81 MG EC tablet Take 1 tablet (81 mg total) by mouth daily. 02/18/16  Yes Gouru, Deanna ArtisAruna, MD  atorvastatin (LIPITOR) 40 MG tablet Take 1 tablet (40 mg total) by mouth daily at 6 PM. Patient taking differently: Take 40 mg by mouth every evening.  01/05/16  Yes Shaune Pollackhen, Qing, MD  barrier cream (NON-SPECIFIED) CREA Apply 1 application topically as needed (skin protection).   Yes [provider]  clopidogrel (PLAVIX) 75 MG tablet Take 1 tablet (75 mg total) by mouth daily. 02/18/16  Yes Gouru, Deanna ArtisAruna, MD  diphenhydrAMINE (BENADRYL) 25 MG tablet Take 25 mg by mouth every  6 (six) hours as needed for allergies.   Yes [provider]  DULoxetine (CYMBALTA) 60 MG capsule Take 60 mg by mouth 2 (two) times daily.    Yes [provider]  folic acid (FOLVITE) 1 MG tablet Take 1 mg by mouth daily.   Yes [provider]  gabapentin (NEURONTIN) 100 MG capsule Take 100 mg by mouth at bedtime.    Yes [provider]  guaiFENesin (ROBITUSSIN) 100 MG/5ML SOLN Take 15 mLs by mouth every 6 (six) hours as needed for cough or to loosen phlegm.   Yes [provider]  ibuprofen (ADVIL,MOTRIN) 200 MG tablet Take 400 mg by mouth every 6 (six) hours as needed for mild pain. (max 2000mg  in 24 hours)   Yes [provider]  loperamide (IMODIUM) 2 MG capsule Take 4 mg by mouth as needed for diarrhea or loose stools. (max 8 doses per 24 hours)   Yes [provider]  magnesium hydroxide (MILK OF MAGNESIA) 400 MG/5ML suspension Take 30 mLs by mouth daily as needed for mild constipation or moderate constipation.   Yes [provider]  Melatonin 3 MG TABS Take 3 mg by mouth at bedtime.   Yes [provider]  menthol-zinc oxide (GOLD BOND) powder Apply 1 application topically daily as needed. (powder or lotion)   Yes [provider]  mirtazapine (REMERON) 15 MG tablet Take 15 mg by mouth at bedtime.   Yes [provider]  pyrithione zinc (HEAD AND SHOULDERS) 1 % shampoo Apply 1 application topically daily as needed for itching. (T-Gel shampoo also acceptable)   Yes [provider]  thiamine (VITAMIN B-1) 100 MG tablet Take 100 mg by mouth daily.    Yes [provider]  vitamin B-12 (CYANOCOBALAMIN) 1000 MCG tablet Take 1,000 mcg by mouth daily.   Yes [provider]    Allergies Patient has no known allergies.    Social History Social History   Tobacco Use  . Smoking status: Current Every Day Smoker    Packs/day: 1.00    Years: 20.00    Pack years: 20.00    Types: Cigarettes  . Smokeless tobacco: Never Used  Substance Use Topics  . Alcohol use: No    Alcohol/week: 3.6 oz    Types: 6 Cans of beer per week  . Drug use: No    Types: Marijuana    Review of Systems Patient denies headaches, rhinorrhea, blurry vision, numbness, shortness of breath, chest pain, edema, cough, abdominal pain, nausea, vomiting, diarrhea, dysuria, fevers, rashes or hallucinations unless otherwise stated above in HPI. ____________________________________________   PHYSICAL EXAM:  VITAL SIGNS: Vitals:   06/30/17 1425 06/30/17 1737  BP: 113/83 (!) 126/93  Pulse: 96 (!) 104  Resp: 18 18  Temp: 98.3 F (36.8 C)   SpO2: 97% 98%     Constitutional: Alert chronically ill appearing  Eyes: Conjunctivae are normal.  Head: Atraumatic. Nose: No congestion/rhinnorhea. Mouth/Throat: Mucous membranes are moist.   Neck: No stridor. Painless ROM.  Cardiovascular: Normal rate, regular rhythm. Grossly normal heart sounds.  Good peripheral circulation. Respiratory: Normal respiratory effort.  No retractions. Lungs CTAB. Gastrointestinal: Soft and nontender. No distention. No abdominal bruits. No CVA tenderness. Genitourinary: deferred Musculoskeletal: s/p bilateral BKA. Neurologic:  Normal speech and language. No gross focal neurologic deficits are appreciated. No facial droop Skin:  Skin is warm, dry and intact. No rash noted. Psychiatric: withdrawn, blunted affect ____________________________________________   LABS (all labs ordered are listed, but  only abnormal results are displayed)  Results for orders placed or performed during the hospital encounter of 06/30/17 (from the past 24 hour(s))  CBC     Status: None   Collection Time: 06/30/17  3:05 PM  Result Value Ref Range   WBC 9.7 3.8 - 10.6 K/uL   RBC 4.98 4.40 - 5.90 MIL/uL   Hemoglobin 16.8 13.0 - 18.0 g/dL   HCT 40.9 81.1 - 91.4 %   MCV 98.0 80.0 - 100.0 fL   MCH 33.7 26.0 - 34.0 pg   MCHC 34.4 32.0 - 36.0 g/dL   RDW 78.2 95.6 - 21.3 %   Platelets 227 150 - 440 K/uL  Basic metabolic panel     Status: Abnormal   Collection Time: 06/30/17  3:05 PM  Result Value Ref Range   Sodium 137 135 - 145 mmol/L   Potassium 4.3 3.5 - 5.1 mmol/L   Chloride 106 101 - 111 mmol/L   CO2 23 22 - 32 mmol/L   Glucose, Bld 109 (H) 65 - 99 mg/dL   BUN 21 (H) 6 - 20 mg/dL   Creatinine, Ser 0.86 0.61 - 1.24 mg/dL   Calcium 9.1 8.9 - 57.8 mg/dL   GFR calc non Af Amer >60 >60 mL/min   GFR calc Af Amer >60 >60 mL/min   Anion gap 8 5 - 15  Urinalysis, Complete w Microscopic     Status: Abnormal   Collection Time: 06/30/17  4:41 PM  Result Value Ref Range   Color, Urine YELLOW  (A) YELLOW   APPearance CLEAR (A) CLEAR   Specific Gravity, Urine 1.012 1.005 - 1.030   pH 6.0 5.0 - 8.0   Glucose, UA NEGATIVE NEGATIVE mg/dL   Hgb urine dipstick NEGATIVE NEGATIVE   Bilirubin Urine NEGATIVE NEGATIVE   Ketones, ur NEGATIVE NEGATIVE mg/dL   Protein, ur NEGATIVE NEGATIVE mg/dL   Nitrite NEGATIVE NEGATIVE   Leukocytes, UA NEGATIVE NEGATIVE   RBC / HPF 0-5 0 - 5 RBC/hpf   WBC, UA 0-5 0 - 5 WBC/hpf   Bacteria, UA NONE SEEN NONE SEEN   Squamous Epithelial / LPF NONE SEEN 0 - 5  Urine Drug Screen, Qualitative ([facility] only)     Status: Abnormal   Collection Time: 06/30/17  4:41 PM  Result Value Ref Range   Tricyclic, Ur Screen NONE DETECTED NONE DETECTED   Amphetamines, Ur Screen NONE DETECTED NONE DETECTED   MDMA (Ecstasy)Ur Screen NONE DETECTED NONE DETECTED   Cocaine Metabolite,Ur [place] NONE DETECTED NONE DETECTED   Opiate, Ur Screen NONE DETECTED NONE DETECTED   Phencyclidine (PCP) Ur S NONE DETECTED NONE DETECTED   Cannabinoid 50 Ng, Ur [place] NONE DETECTED NONE DETECTED   Barbiturates, Ur Screen (A) NONE DETECTED    Result not available. Reagent lot number recalled by manufacturer.   Benzodiazepine, Ur Scrn NONE DETECTED NONE DETECTED   Methadone Scn, Ur NONE DETECTED NONE DETECTED   ____________________________________________  EKG My review and personal interpretation at Time: 16:32   Indication: fall  Rate: 100  Rhythm: sinus Axis: normal Other: rbbb, no stemi, there is first degree heart block, nonspecific st abn unchanged from previous ____________________________________________  RADIOLOGY  I personally reviewed all radiographic images ordered to evaluate for the above acute complaints and reviewed radiology reports and findings.  These findings were personally discussed with the patient.  Please see medical record for radiology report.  ____________________________________________   PROCEDURES  Procedure(s) performed:  Procedures    Critical Care  performed:  no ____________________________________________   INITIAL IMPRESSION / ASSESSMENT AND PLAN / ED COURSE  Pertinent labs & imaging results that were available during my care of the patient were reviewed by me and considered in my medical decision making (see chart for details).   DDX: Dehydration, sepsis, pna, uti, hypoglycemia, cva, drug effect, withdrawal, sdh, iph,  Maxwell Webb is a 67 y.o. who presents to the ED with report of fall was witnessed.  Is complaining of headache at this time but no evidence of obvious trauma.  Blood work sent for the above differential as is unable to provide reliable history.  Denies any active chest pain or shortness of breath at this time.  No evidence of dysrhythmia.  Unlikely ACS.  Blood work sent off for the above differential.  Will observe on monitor.  Clinical Course as of Jun 30 2052  Sun Jun 30, 2017  1728 Radiograph for testing as well as blood work here is reassuring.  Patient resting comfortably and in no acute distress.  At this point believe patient stable and appropriate for outpatient follow-up.  Have discussed with the patient and available family all diagnostics and treatments performed thus far and all questions were answered to the best of my ability. The patient demonstrates understanding and agreement with plan.    [PR]    Clinical Course User Index [PR] Willy Eddy, MD     As part of my medical decision making, I reviewed the following data within the electronic MEDICAL RECORD NUMBER Nursing notes reviewed and incorporated, Labs reviewed, notes from prior ED visits.   ____________________________________________   FINAL CLINICAL IMPRESSION(S) / ED DIAGNOSES  Final diagnoses:  Fall, initial encounter      NEW MEDICATIONS STARTED DURING THIS VISIT:  Discharge Medication List as of 06/30/2017  5:54 PM       Note:  This document was prepared using Dragon voice recognition software and may include unintentional  dictation errors.    Willy Eddy, MD 06/30/17 [phone]

## 2017-06-30 NOTE — ED Notes (Signed)
Patient left via EMS

## 2019-01-14 ENCOUNTER — Emergency Department: Payer: Medicare Other

## 2019-01-14 ENCOUNTER — Encounter: Payer: Self-pay | Admitting: Emergency Medicine

## 2019-01-14 ENCOUNTER — Emergency Department
Admission: EM | Admit: 2019-01-14 | Discharge: 2019-01-14 | Disposition: A | Discharge status: Skilled Nursing Facility | Payer: Medicare Other | Attending: Student in an Organized Health Care Education/Training Program | Admitting: Student in an Organized Health Care Education/Training Program

## 2019-01-14 ENCOUNTER — Other Ambulatory Visit: Payer: Self-pay

## 2019-01-14 DIAGNOSIS — W19XXXA Unspecified fall, initial encounter: Secondary | ICD-10-CM

## 2019-01-14 DIAGNOSIS — Y999 Unspecified external cause status: Secondary | ICD-10-CM | POA: Insufficient documentation

## 2019-01-14 DIAGNOSIS — Z7982 Long term (current) use of aspirin: Secondary | ICD-10-CM | POA: Insufficient documentation

## 2019-01-14 DIAGNOSIS — I10 Essential (primary) hypertension: Secondary | ICD-10-CM | POA: Insufficient documentation

## 2019-01-14 DIAGNOSIS — S0012XA Contusion of left eyelid and periocular area, initial encounter: Secondary | ICD-10-CM | POA: Diagnosis not present

## 2019-01-14 DIAGNOSIS — Y9389 Activity, other specified: Secondary | ICD-10-CM | POA: Diagnosis not present

## 2019-01-14 DIAGNOSIS — Z79899 Other long term (current) drug therapy: Secondary | ICD-10-CM | POA: Insufficient documentation

## 2019-01-14 DIAGNOSIS — W050XXA Fall from non-moving wheelchair, initial encounter: Secondary | ICD-10-CM | POA: Diagnosis not present

## 2019-01-14 DIAGNOSIS — Z7902 Long term (current) use of antithrombotics/antiplatelets: Secondary | ICD-10-CM | POA: Diagnosis not present

## 2019-01-14 DIAGNOSIS — S01112A Laceration without foreign body of left eyelid and periocular area, initial encounter: Secondary | ICD-10-CM

## 2019-01-14 DIAGNOSIS — F17210 Nicotine dependence, cigarettes, uncomplicated: Secondary | ICD-10-CM | POA: Diagnosis not present

## 2019-01-14 DIAGNOSIS — S80211A Abrasion, right knee, initial encounter: Secondary | ICD-10-CM | POA: Diagnosis not present

## 2019-01-14 DIAGNOSIS — Y929 Unspecified place or not applicable: Secondary | ICD-10-CM | POA: Diagnosis not present

## 2019-01-14 DIAGNOSIS — S0083XA Contusion of other part of head, initial encounter: Secondary | ICD-10-CM

## 2019-01-14 DIAGNOSIS — Z23 Encounter for immunization: Secondary | ICD-10-CM | POA: Diagnosis not present

## 2019-01-14 DIAGNOSIS — S0990XA Unspecified injury of head, initial encounter: Secondary | ICD-10-CM | POA: Diagnosis present

## 2019-01-14 HISTORY — DX: Wernicke's encephalopathy: E51.2

## 2019-01-14 MED ORDER — TETANUS-DIPHTH-ACELL PERTUSSIS 5-2.5-18.5 LF-MCG/0.5 IM SUSP
0.5000 mL | Freq: Once | INTRAMUSCULAR | Status: AC
  Administered 2019-01-14: 0.5 mL via INTRAMUSCULAR
  Filled 2019-01-14: qty 0.5

## 2019-01-14 NOTE — ED Notes (Signed)
Per daughter, pt is baseline confused/disoriented.

## 2019-01-14 NOTE — ED Triage Notes (Signed)
Patient brought by [facility] after he fell out of wheelchair while trying to put his cigarette out.  He has a laceration to left forehead.  He is alert.

## 2019-01-14 NOTE — ED Notes (Addendum)
Attempted to call daughter to give update on discharge, no answer. Daughter updated on plan of care by [facility] when she was here. Report given to [place] at [facility], advised they do not provide transport.

## 2019-01-14 NOTE — ED Provider Notes (Signed)
[facility] Emergency Department Provider Note  ____________________________________________  Time seen: Approximately 3:33 PM  I have reviewed the triage vital signs and the nursing notes.   HISTORY  Chief Complaint Fall and Laceration  Level 5 caveat: Patient poor historian.  Only provides answers to yes/no questions.  HPI Maxwell Webb is a 68 y.o. male who presents the emergency department via EMS after falling out of his wheelchair chair.  Patient is is a very poor historian and cannot give me any details.  Patient obviously has sustained a laceration to the left eyebrow region.  Patient initially said no to any injury.  When asked about the laceration he verbalizes yes about new injury.  Patient states yes with questions regarding headache.  Patient states no to all other questions regarding neck pain, chest pain, blurred vision, nausea.  EMS reported that patient fell out of her wheelchair while attempting to put a cigarette out.  Patient is legal guardians, his daughters healthcare power of attorney.  They are contacted, states that at baseline patient has no short-term memory.  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        Past Medical History:  Diagnosis Date  . Anxiety   . Depression   . Hyperlipemia   . Hypertension   . Korsakoff disease (HCC)   . Nicotine abuse   . Osteomyelitis (HCC)   . PVD (peripheral vascular disease) (HCC)   . Stroke (HCC)   . Wernicke encephalopathy     Patient Active Problem List   Diagnosis Date Noted  . Numbness and tingling in left hand 04/17/2016  . CVA (cerebral vascular accident) (HCC) 02/16/2016  . TIA (transient ischemic attack) 01/04/2016  . 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 BELOW KNEE LEG AMPUTATION Bilateral   . FINGER SURGERY     left  index finger - as a child    Prior to Admission medications   Medication Sig Start Date End Date Taking? Authorizing Provider  acetaminophen (TYLENOL) 325 MG tablet Take 650 mg by mouth every 4 (four) hours as needed for mild pain, moderate pain or headache.    [provider]  alum & mag hydroxide-simeth (MINTOX) 200-200-20 MG/5ML suspension Take 30 mLs by mouth 4 (four) times daily as needed for indigestion or heartburn.    [provider]  ARIPiprazole (ABILIFY) 15 MG tablet Take 10 mg by mouth every evening.     [provider]  aspirin EC 81 MG EC tablet Take 1 tablet (81 mg total) by mouth daily. 02/18/16   Ramonita Lab, MD  atorvastatin (LIPITOR) 40 MG tablet Take 1 tablet (40 mg total) by mouth daily at 6 PM. Patient taking differently: Take 40 mg by mouth every evening.  01/05/16   Shaune Pollack, MD  barrier cream (NON-SPECIFIED) CREA Apply 1 application topically as needed (skin protection).    [provider]  clopidogrel (PLAVIX) 75 MG tablet Take 1 tablet (75 mg total) by mouth daily. 02/18/16   Ramonita Lab, MD  diphenhydrAMINE (BENADRYL) 25 MG tablet Take 25 mg by mouth every 6 (six) hours as needed for allergies.    [provider]  DULoxetine (CYMBALTA) 60 MG capsule Take 60 mg by mouth 2 (two) times daily.     [provider]  folic acid (FOLVITE) 1 MG tablet Take 1 mg by  mouth daily.    [provider]  gabapentin (NEURONTIN) 100 MG capsule Take 100 mg by mouth at bedtime.     [provider]  guaiFENesin (ROBITUSSIN) 100 MG/5ML SOLN Take 15 mLs by mouth every 6 (six) hours as needed for cough or to loosen phlegm.    [provider]  ibuprofen (ADVIL,MOTRIN) 200 MG tablet Take 400 mg by mouth every 6 (six) hours as needed for mild pain. (max  in 24 hours)    [provider]  loperamide (IMODIUM) 2 MG capsule Take 4 mg by mouth as needed for diarrhea or loose stools. (max 8 doses per 24  hours)    [provider]  magnesium hydroxide (MILK OF MAGNESIA) 400 MG/5ML suspension Take 30 mLs by mouth daily as needed for mild constipation or moderate constipation.    [provider]  Melatonin 3 MG TABS Take 3 mg by mouth at bedtime.    [provider]  menthol-zinc oxide (GOLD BOND) powder Apply 1 application topically daily as needed. (powder or lotion)    [provider]  mirtazapine (REMERON) 15 MG tablet Take 15 mg by mouth at bedtime.    [provider]  pyrithione zinc (HEAD AND SHOULDERS) 1 % shampoo Apply 1 application topically daily as needed for itching. (T-Gel shampoo also acceptable)    [provider]  thiamine (VITAMIN B-1) 100 MG tablet Take 100 mg by mouth daily.     [provider]  vitamin B-12 (CYANOCOBALAMIN) 1000 MCG tablet Take 1,000 mcg by mouth daily.    [provider]    Allergies Patient has no known allergies.  Family History  Problem Relation Age of Onset  . Hypertension Mother   . Other Mother        brain bleed from accidental fall on ice - age 25  . Diabetes Father     Social History Social History   Tobacco Use  . Smoking status: Current Every Day Smoker    Packs/day: 1.00    Years: 20.00    Pack years: 20.00    Types: Cigarettes  . Smokeless tobacco: Never Used  Substance Use Topics  . Alcohol use: No    Alcohol/week: 6.0 standard drinks    Types: 6 Cans of beer per week  . Drug use: No    Types: Marijuana     Review of Systems Limited by patient's medical history, baseline short-term memory issues.  Eyes: No visual changes.  Cardiovascular: no chest pain. Respiratory: nNo SOB. Gastrointestinal: No abdominal pain.   Musculoskeletal: Negative for musculoskeletal pain. Skin: Left eyebrow laceration Neurological: Endorses headache 10-point ROS otherwise negative.  ____________________________________________   PHYSICAL EXAM:  VITAL SIGNS: ED  Triage Vitals  Enc Vitals Group     BP --      Pulse --      Resp --      Temp --      Temp src --      SpO2 --      Weight 01/14/19 1257 210 lb (95.3 kg)     Height --      Head Circumference --      Peak Flow --      Pain Score 01/14/19 1256 6     Pain Loc --      Pain Edu? --      Excl. in GC? --      Constitutional: Alert and oriented. Well appearing and in no acute distress. Eyes:  Conjunctivae are normal. PERRL. EOMI. Head: Superficial abrasion through the left eyebrow.  Dried blood appreciated along the forehead.  On removal no other visible signs of trauma.  Laceration is superficial in nature and does not require primary closure.  Underlying ecchymosis about the left eye.  Patient endorses tenderness to palpation of the occipital skull.  No other tenderness to palpation is reported. ENT:      Ears:       Nose: No congestion/rhinnorhea.      Mouth/Throat: Mucous membranes are moist.  Neck: No stridor.  No cervical spine tenderness to palpation. Cardiovascular: Normal rate, regular rhythm. Normal S1 and S2.  Good peripheral circulation. Respiratory: Normal respiratory effort without tachypnea or retractions. Lungs CTAB. Good air entry to the bases with no decreased or absent breath sounds. Musculoskeletal: Full range of motion to all extremities. No gross deformities appreciated. Neurologic: Patient only answers yes/no questions.  Unable to follow cranial nerve testing Skin:  Skin is warm, dry and intact. No rash noted. Psychiatric: Mood and affect are apparently at baseline.     ____________________________________________   LABS (all labs ordered are listed, but only abnormal results are displayed)  Labs Reviewed - No data to display ____________________________________________  EKG   ____________________________________________  RADIOLOGY I personally viewed and evaluated these images as part of my medical decision making, as well as reviewing the written  report by the radiologist.  CT Head Wo Contrast  Result Date: 01/14/2019 CLINICAL DATA:  Facial trauma status post fall EXAM: CT HEAD WITHOUT CONTRAST CT MAXILLOFACIAL WITHOUT CONTRAST TECHNIQUE: Multidetector CT imaging of the head and maxillofacial structures were performed using the standard protocol without intravenous contrast. Multiplanar CT image reconstructions of the maxillofacial structures were also generated. COMPARISON:  06/30/2017 FINDINGS: CT HEAD FINDINGS Brain: No evidence of acute infarction, hemorrhage, extra-axial collection, ventriculomegaly, or mass effect. Generalized cerebral atrophy. Periventricular white matter low attenuation likely secondary to microangiopathy. Vascular: Cerebrovascular atherosclerotic calcifications are noted. Skull: Negative for fracture or focal lesion. Sinuses/Orbits: Visualized portions of the orbits are unremarkable. Visualized portions of the paranasal sinuses are unremarkable. Visualized portions of the mastoid air cells are unremarkable. Other: None. CT MAXILLOFACIAL FINDINGS Osseous: No fracture or mandibular dislocation. No destructive process. Degenerative disease with disc height loss at C5-6. Moderate right facet arthropathy at C2-3 and C3-4. Orbits: Negative. No traumatic or inflammatory finding. Sinuses: Right maxillary sinus mucosal thickening. No paranasal sinus air-fluid level. Soft tissues: Negative. IMPRESSION: 1. No acute intracranial pathology. 2.  No acute osseous injury of the maxillofacial bones. Electronically Signed   By: Elige KoHetal  Patel   On: 01/14/2019 16:36   CT Cervical Spine Wo Contrast  Result Date: 01/14/2019 CLINICAL DATA:  Fall EXAM: CT CERVICAL SPINE WITHOUT CONTRAST TECHNIQUE: Multidetector CT imaging of the cervical spine was performed without intravenous contrast. Multiplanar CT image reconstructions were also generated. COMPARISON:  None. FINDINGS: Alignment: Straightening of the cervical lordosis. There is trace  anterolisthesis at C3-C4 and retrolisthesis at C5-C6. Skull base and vertebrae: Vertebral body heights are maintained apart from degenerative endplate irregularity primarily at C5-C6 and C6-C7. Soft tissues and spinal canal: No prevertebral fluid or swelling. No visible canal hematoma. Disc levels: Multilevel degenerative changes are present including disc space narrowing, endplate osteophytes, and facet and uncovertebral hypertrophy. These changes are greatest from C4-C5 through C6-C7. Upper chest: No apical lung mass. Other: Calcified plaque at the common carotid bifurcations. IMPRESSION: No acute cervical spine fracture. Electronically Signed   By: Jackquline BerlinPraneil  Patel M.D.  On: 01/14/2019 16:21   DG Knee Complete 4 Views Right  Result Date: 01/14/2019 CLINICAL DATA:  Pain status post fall. EXAM: RIGHT KNEE - COMPLETE 4+ VIEW COMPARISON:  None. FINDINGS: The patient is status post prior below-knee amputation. There is no acute displaced fracture or dislocation. There is a small joint effusion. Mild tricompartmental degenerative changes are noted, greatest within the patellofemoral compartment. Vascular calcifications are noted. IMPRESSION: 1. No acute abnormality. 2. Status post below-knee amputation. 3. Mild tricompartmental degenerative changes. Small joint effusion. Electronically Signed   By: Katherine Mantle M.D.   On: 01/14/2019 17:35   CT Maxillofacial Wo Contrast  Result Date: 01/14/2019 CLINICAL DATA:  Facial trauma status post fall EXAM: CT HEAD WITHOUT CONTRAST CT MAXILLOFACIAL WITHOUT CONTRAST TECHNIQUE: Multidetector CT imaging of the head and maxillofacial structures were performed using the standard protocol without intravenous contrast. Multiplanar CT image reconstructions of the maxillofacial structures were also generated. COMPARISON:  06/30/2017 FINDINGS: CT HEAD FINDINGS Brain: No evidence of acute infarction, hemorrhage, extra-axial collection, ventriculomegaly, or mass effect.  Generalized cerebral atrophy. Periventricular white matter low attenuation likely secondary to microangiopathy. Vascular: Cerebrovascular atherosclerotic calcifications are noted. Skull: Negative for fracture or focal lesion. Sinuses/Orbits: Visualized portions of the orbits are unremarkable. Visualized portions of the paranasal sinuses are unremarkable. Visualized portions of the mastoid air cells are unremarkable. Other: None. CT MAXILLOFACIAL FINDINGS Osseous: No fracture or mandibular dislocation. No destructive process. Degenerative disease with disc height loss at C5-6. Moderate right facet arthropathy at C2-3 and C3-4. Orbits: Negative. No traumatic or inflammatory finding. Sinuses: Right maxillary sinus mucosal thickening. No paranasal sinus air-fluid level. Soft tissues: Negative. IMPRESSION: 1. No acute intracranial pathology. 2.  No acute osseous injury of the maxillofacial bones. Electronically Signed   By: Elige Ko   On: 01/14/2019 16:36    ____________________________________________    PROCEDURES  Procedure(s) performed:    Procedures    Medications  Tdap (BOOSTRIX) injection 0.5 mL (has no administration in time range)     ____________________________________________   INITIAL IMPRESSION / ASSESSMENT AND PLAN / ED COURSE  Pertinent labs & imaging results that were available during my care of the patient were reviewed by me and considered in my medical decision making (see chart for details).  Review of the [place] CSRS was performed in accordance of the [facility] prior to dispensing any controlled drugs.  Clinical Course as of Jan 13 1750  Wed Jan 14, 2019  1744 Patient's daughter who is the patient's healthcare power of attorney presented to the emergency department and I had a discussion with her regarding patient's medical history, and this encounter emergency department, plan for discharge with the patient.  When the patient's daughter had arrived, he had endorsed knee  pain to the right lower extremity as well.  Patient was a bilateral lower amputee.  Patient did have what appeared to be abrasion to the stump of the right amputation.  Given this finding I imaged the right lower extremity as well.  No evidence of fracture.  Patient is stable at this time and will have a tetanus shot and be discharged.   [JC]    Clinical Course User Index [JC] Maley Venezia, Delorise Royals, PA-C          Patient's diagnosis is consistent with fall out of his wheelchair, contusion, superficial laceration to the face, right knee abrasion.  Patient presented to the emergency department via EMS from the [facility].  Patient healthcare power of attorney was contacted and she presented  to the emergency department.  Healthcare prior attorney is the patient's daughter.  We discussed imaging, plan for the patient and she was agreeable with that plan.  Patient had reassuring imaging.  Will have tetanus shot, will be discharged back to the [facility]..  No prescriptions at this time.  Follow-up primary care as needed.  Patient is given ED precautions to return to the ED for any worsening or new symptoms.     ____________________________________________  FINAL CLINICAL IMPRESSION(S) / ED DIAGNOSES  Final diagnoses:  Fall, initial encounter  Contusion of face, initial encounter  Laceration of left eyebrow, initial encounter      NEW MEDICATIONS STARTED DURING THIS VISIT:  ED Discharge Orders    None          This chart was dictated using voice recognition software/Dragon. Despite best efforts to proofread, errors can occur which can change the meaning. Any change was purely unintentional.    Darletta Moll, PA-C 01/14/19 1751    Merlyn Lot, MD 01/14/19 [phone]

## 2019-01-14 NOTE — ED Notes (Signed)
Pt disoriented to place, time and situation. Oriented to person only

## 2019-02-03 ENCOUNTER — Emergency Department: Payer: Medicare Other

## 2019-02-03 ENCOUNTER — Inpatient Hospital Stay
Admission: EM | Admit: 2019-02-03 | Discharge: 2019-02-05 | DRG: 897 | Disposition: A | Discharge status: Skilled Nursing Facility | Payer: Medicare Other | Source: Skilled Nursing Facility | Attending: Hospitalist | Admitting: Hospitalist

## 2019-02-03 ENCOUNTER — Encounter: Payer: Self-pay | Admitting: Emergency Medicine

## 2019-02-03 ENCOUNTER — Other Ambulatory Visit: Payer: Self-pay

## 2019-02-03 DIAGNOSIS — E785 Hyperlipidemia, unspecified: Secondary | ICD-10-CM | POA: Diagnosis present

## 2019-02-03 DIAGNOSIS — F1096 Alcohol use, unspecified with alcohol-induced persisting amnestic disorder: Principal | ICD-10-CM | POA: Diagnosis present

## 2019-02-03 DIAGNOSIS — F04 Amnestic disorder due to known physiological condition: Secondary | ICD-10-CM | POA: Diagnosis not present

## 2019-02-03 DIAGNOSIS — F329 Major depressive disorder, single episode, unspecified: Secondary | ICD-10-CM | POA: Diagnosis not present

## 2019-02-03 DIAGNOSIS — F17210 Nicotine dependence, cigarettes, uncomplicated: Secondary | ICD-10-CM | POA: Diagnosis present

## 2019-02-03 DIAGNOSIS — Z7982 Long term (current) use of aspirin: Secondary | ICD-10-CM | POA: Diagnosis not present

## 2019-02-03 DIAGNOSIS — Z833 Family history of diabetes mellitus: Secondary | ICD-10-CM | POA: Diagnosis not present

## 2019-02-03 DIAGNOSIS — R410 Disorientation, unspecified: Secondary | ICD-10-CM

## 2019-02-03 DIAGNOSIS — R4182 Altered mental status, unspecified: Secondary | ICD-10-CM

## 2019-02-03 DIAGNOSIS — G4733 Obstructive sleep apnea (adult) (pediatric): Secondary | ICD-10-CM | POA: Diagnosis not present

## 2019-02-03 DIAGNOSIS — Z993 Dependence on wheelchair: Secondary | ICD-10-CM | POA: Diagnosis not present

## 2019-02-03 DIAGNOSIS — I10 Essential (primary) hypertension: Secondary | ICD-10-CM | POA: Diagnosis present

## 2019-02-03 DIAGNOSIS — I639 Cerebral infarction, unspecified: Secondary | ICD-10-CM | POA: Diagnosis present

## 2019-02-03 DIAGNOSIS — Z20822 Contact with and (suspected) exposure to covid-19: Secondary | ICD-10-CM | POA: Diagnosis present

## 2019-02-03 DIAGNOSIS — F32A Depression, unspecified: Secondary | ICD-10-CM

## 2019-02-03 DIAGNOSIS — Z8249 Family history of ischemic heart disease and other diseases of the circulatory system: Secondary | ICD-10-CM | POA: Diagnosis not present

## 2019-02-03 DIAGNOSIS — F419 Anxiety disorder, unspecified: Secondary | ICD-10-CM

## 2019-02-03 DIAGNOSIS — Z89511 Acquired absence of right leg below knee: Secondary | ICD-10-CM | POA: Diagnosis not present

## 2019-02-03 DIAGNOSIS — Z7902 Long term (current) use of antithrombotics/antiplatelets: Secondary | ICD-10-CM

## 2019-02-03 DIAGNOSIS — Z89512 Acquired absence of left leg below knee: Secondary | ICD-10-CM

## 2019-02-03 DIAGNOSIS — Z79899 Other long term (current) drug therapy: Secondary | ICD-10-CM | POA: Diagnosis not present

## 2019-02-03 DIAGNOSIS — I739 Peripheral vascular disease, unspecified: Secondary | ICD-10-CM | POA: Diagnosis present

## 2019-02-03 DIAGNOSIS — Z8673 Personal history of transient ischemic attack (TIA), and cerebral infarction without residual deficits: Secondary | ICD-10-CM | POA: Diagnosis not present

## 2019-02-03 LAB — COMPREHENSIVE METABOLIC PANEL
ALT: 29 U/L (ref 0–44)
AST: 42 U/L — ABNORMAL HIGH (ref 15–41)
Albumin: 3.4 g/dL — ABNORMAL LOW (ref 3.5–5.0)
Alkaline Phosphatase: 82 U/L (ref 38–126)
Anion gap: 12 (ref 5–15)
BUN: 17 mg/dL (ref 8–23)
CO2: 21 mmol/L — ABNORMAL LOW (ref 22–32)
Calcium: 9.2 mg/dL (ref 8.9–10.3)
Chloride: 108 mmol/L (ref 98–111)
Creatinine, Ser: 0.93 mg/dL (ref 0.61–1.24)
GFR calc Af Amer: >60 mL/min (ref >60)
GFR calc non Af Amer: >60 mL/min (ref >60)
Glucose, Bld: 76 mg/dL (ref 70–99)
Potassium: 3.8 mmol/L (ref 3.5–5.1)
Sodium: 141 mmol/L (ref 135–145)
Total Bilirubin: 0.9 mg/dL (ref 0.3–1.2)
Total Protein: 8.1 g/dL (ref 6.5–8.1)

## 2019-02-03 LAB — CBC WITH DIFFERENTIAL/PLATELET
Abs Immature Granulocytes: 0.01 10*3/uL (ref 0.00–0.07)
Basophils Absolute: 0.1 10*3/uL (ref 0.0–0.1)
Basophils Relative: 1 %
Eosinophils Absolute: 0.2 10*3/uL (ref 0.0–0.5)
Eosinophils Relative: 2 %
HCT: 48.1 % (ref 39.0–52.0)
Hemoglobin: 15.7 g/dL (ref 13.0–17.0)
Immature Granulocytes: 0 %
Lymphocytes Relative: 35 %
Lymphs Abs: 2.4 10*3/uL (ref 0.7–4.0)
MCH: 30.9 pg (ref 26.0–34.0)
MCHC: 32.6 g/dL (ref 30.0–36.0)
MCV: 94.7 fL (ref 80.0–100.0)
Monocytes Absolute: 0.7 10*3/uL (ref 0.1–1.0)
Monocytes Relative: 10 %
Neutro Abs: 3.7 10*3/uL (ref 1.7–7.7)
Neutrophils Relative %: 52 %
Platelets: 339 10*3/uL (ref 150–400)
RBC: 5.08 MIL/uL (ref 4.22–5.81)
RDW: 13.2 % (ref 11.5–15.5)
WBC: 7.1 10*3/uL (ref 4.0–10.5)
nRBC: 0 % (ref 0.0–0.2)

## 2019-02-03 LAB — MAGNESIUM: Magnesium: 2.3 mg/dL (ref 1.7–2.4)

## 2019-02-03 LAB — AMMONIA: Ammonia: 33 umol/L (ref 9–35)

## 2019-02-03 LAB — TROPONIN I (HIGH SENSITIVITY): Troponin I (High Sensitivity): <2 ng/L (ref <18)

## 2019-02-03 MED ORDER — THIAMINE HCL 100 MG/ML IJ SOLN
500.0000 mg | Freq: Once | INTRAMUSCULAR | Status: AC
  Administered 2019-02-03: 22:00:00 500 mg via INTRAVENOUS
  Filled 2019-02-03: qty 6

## 2019-02-03 MED ORDER — ENOXAPARIN SODIUM 40 MG/0.4ML ~~LOC~~ SOLN
40.0000 mg | SUBCUTANEOUS | Status: DC
  Administered 2019-02-03 – 2019-02-04 (×2): 40 mg via SUBCUTANEOUS
  Filled 2019-02-03 (×2): qty 0.4

## 2019-02-03 NOTE — ED Triage Notes (Signed)
Pt presents to ED via [facility] [address] C/O of AMS. Staff report is usually responds verbally but today is not responding. EMS reports Pt was covered in urine ans still has armband from previous visit on 12/30. Vitals signs WNL.

## 2019-02-03 NOTE — ED Notes (Signed)
Coude cath attempted, still unable to pass completely. No urine return. EDP [place] notified.

## 2019-02-03 NOTE — ED Provider Notes (Signed)
[facility] Emergency Department Provider Note   ____________________________________________   First MD Initiated Contact with Patient 02/03/19 2034     (approximate)  I have reviewed the triage vital signs and the nursing notes.   HISTORY  Chief Complaint Altered Mental Status    HPI Maxwell Webb is a 69 y.o. male with past medical history of Warnicke's encephalopathy and Korsakoff disease, hypertension, hyperlipidemia, and stroke who presents to the ED for altered mental status.  Patient resides at the [facility] nursing facility and per EMS was noted to have a change in his mental status prior to receiving his evening medications.  Staff at the nursing facility was unable to state to EMS when he was last known to be normal, they were unsure whether he received his morning and lunchtime medications.  Patient is reportedly verbal at baseline, but is currently nonverbal and staff at the nursing facility felt he seemed confused, staring off into the distance and not making eye contact.  Patient shakes his head no when he is asked if he is in any pain.        Past Medical History:  Diagnosis Date  . Anxiety   . Depression   . Hyperlipemia   . Hypertension   . Korsakoff disease (HCC)   . Nicotine abuse   . Osteomyelitis (HCC)   . PVD (peripheral vascular disease) (HCC)   . Stroke (HCC)   . Wernicke encephalopathy     Patient Active Problem List   Diagnosis Date Noted  . Numbness and tingling in left hand 04/17/2016  . CVA (cerebral vascular accident) (HCC) 02/16/2016  . TIA (transient ischemic attack) 01/04/2016  . 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 BELOW KNEE LEG AMPUTATION Bilateral   . FINGER SURGERY     left index finger - as a child    Prior to Admission medications   Medication Sig Start Date End Date  Taking? Authorizing Provider  acetaminophen (TYLENOL) 325 MG tablet Take 650 mg by mouth every 4 (four) hours as needed for mild pain, moderate pain or headache.    [provider]  alum & mag hydroxide-simeth (MINTOX) 200-200-20 MG/5ML suspension Take 30 mLs by mouth 4 (four) times daily as needed for indigestion or heartburn.    [provider]  ARIPiprazole (ABILIFY) 15 MG tablet Take 10 mg by mouth every evening.     [provider]  aspirin EC 81 MG EC tablet Take 1 tablet (81 mg total) by mouth daily. 02/18/16   Ramonita Lab, MD  atorvastatin (LIPITOR) 40 MG tablet Take 1 tablet (40 mg total) by mouth daily at 6 PM. Patient taking differently: Take 40 mg by mouth every evening.  01/05/16   Shaune Pollack, MD  barrier cream (NON-SPECIFIED) CREA Apply 1 application topically as needed (skin protection).    [provider]  clopidogrel (PLAVIX) 75 MG tablet Take 1 tablet (75 mg total) by mouth daily. 02/18/16   Ramonita Lab, MD  diphenhydrAMINE (BENADRYL) 25 MG tablet Take 25 mg by mouth every 6 (six) hours as needed for allergies.    [provider]  DULoxetine (CYMBALTA) 60 MG capsule Take 60 mg by mouth 2 (two) times daily.     [provider]  folic acid (FOLVITE) 1 MG tablet Take 1 mg by mouth daily.    [provider]  gabapentin (NEURONTIN) 100 MG capsule Take 100 mg by mouth at bedtime.     [provider]  guaiFENesin (ROBITUSSIN) 100 MG/5ML SOLN Take 15 mLs by mouth every 6 (six) hours as needed for cough or to loosen phlegm.    [provider]  ibuprofen (ADVIL,MOTRIN) 200 MG tablet Take 400 mg by mouth every 6 (six) hours as needed for mild pain. (max 2000mg  in 24 hours)    [provider]  loperamide (IMODIUM) 2 MG capsule Take 4 mg by mouth as needed for diarrhea or loose stools. (max 8 doses per 24 hours)    [provider]  magnesium hydroxide (MILK OF MAGNESIA) 400 MG/5ML suspension Take 30  mLs by mouth daily as needed for mild constipation or moderate constipation.    [provider]  Melatonin 3 MG TABS Take 3 mg by mouth at bedtime.    [provider]  menthol-zinc oxide (GOLD BOND) powder Apply 1 application topically daily as needed. (powder or lotion)    [provider]  mirtazapine (REMERON) 15 MG tablet Take 15 mg by mouth at bedtime.    [provider]  pyrithione zinc (HEAD AND SHOULDERS) 1 % shampoo Apply 1 application topically daily as needed for itching. (T-Gel shampoo also acceptable)    [provider]  thiamine (VITAMIN B-1) 100 MG tablet Take 100 mg by mouth daily.     [provider]  vitamin B-12 (CYANOCOBALAMIN) 1000 MCG tablet Take 1,000 mcg by mouth daily.    [provider]    Allergies Patient has no known allergies.  Family History  Problem Relation Age of Onset  . Hypertension Mother   . Other Mother        brain bleed from accidental fall on ice - age 83  . Diabetes Father     Social History Social History   Tobacco Use  . Smoking status: Current Every Day Smoker    Packs/day: 1.00    Years: 20.00    Pack years: 20.00    Types: Cigarettes  . Smokeless tobacco: Never Used  Substance Use Topics  . Alcohol use: No    Alcohol/week: 6.0 standard drinks    Types: 6 Cans of beer per week  . Drug use: No    Types: Marijuana    Review of Systems Unable to obtain secondary to altered mental status  ____________________________________________   PHYSICAL EXAM:  VITAL SIGNS: ED Triage Vitals [02/03/19 2033]  Enc Vitals Group     BP 114/87     Pulse Rate 72     Resp      Temp      Temp src      SpO2 97 %     Weight 175 lb (79.4 kg)     Height 5\' 7"  (1.702 m)     Head Circumference      Peak Flow      Pain Score      Pain Loc      Pain Edu?      Excl. in GC?     Constitutional: Alert and staring straight ahead Eyes: Conjunctivae are normal.  Pupils equal round  and reactive to light bilaterally. Head: Atraumatic. Nose: No congestion/rhinnorhea. Mouth/Throat: Mucous membranes are moist. Neck: Normal ROM Cardiovascular: Normal rate, regular rhythm. Grossly normal heart sounds. Respiratory: Normal respiratory effort.  No retractions. Lungs CTAB. Gastrointestinal: Soft and nontender. No distention. Genitourinary: deferred Musculoskeletal: No lower extremity tenderness nor edema.  Status post  bilateral BKA. Neurologic: Patient nonverbal but follows commands. No gross focal neurologic deficits are appreciated. Skin:  Skin is warm, dry and intact. No rash noted.  ____________________________________________   LABS (all labs ordered are listed, but only abnormal results are displayed)  Labs Reviewed  COMPREHENSIVE METABOLIC PANEL - Abnormal; Notable for the following components:      Result Value   CO2 21 (*)    Albumin 3.4 (*)    AST 42 (*)    All other components within normal limits  SARS CORONAVIRUS 2 (TAT 6-24 HRS)  CBC WITH DIFFERENTIAL/PLATELET  AMMONIA  MAGNESIUM  URINALYSIS, COMPLETE (UACMP) WITH MICROSCOPIC  URINE DRUG SCREEN, QUALITATIVE ([facility] ONLY)  ETHANOL  TROPONIN I (HIGH SENSITIVITY)  TROPONIN I (HIGH SENSITIVITY)   ____________________________________________  EKG  ED ECG REPORT I, Blake Divine, the attending physician, personally viewed and interpreted this ECG.   Date: 02/03/2019  EKG Time: 20:40  Rate: 67  Rhythm: normal sinus rhythm  Axis: Normal  Intervals:first-degree A-V block  and right bundle branch block  ST&T Change: None   PROCEDURES  Procedure(s) performed (including Critical Care):  Procedures   ____________________________________________   INITIAL IMPRESSION / ASSESSMENT AND PLAN / ED COURSE       69 year old male with history of Wernicke's encephalopathy and Korsakoff disease presents to the ED for change in mental status noted throughout the day today at the nursing facility where  he resides.  His last known normal is not clear as staff at the facility was unable to provide this information, however he does not appear to have any focal neurologic deficits and stroke seems less likely.  He follows commands but is nonverbal and not able to answer questions, will check CT head.  Infectious process seems less likely and vital signs are not consistent with sepsis, will screen chest x-ray and UA.  EKG without acute changes, shows right bundle branch block similar to prior.  It is possible that he missed multiple doses of his thiamine medication contributing to worsening Wernicke's/Korsakoff disease, will give IV dose of thiamine.  We will also check ammonia, LFTs given his prior alcohol abuse.  CT head negative for acute process and lab work is unremarkable.  Patient remains less interactive and nonverbal following IV thiamine dose.  Will admit overnight for further work-up and observation.  Case discussed with hospitalist, who accepts patient for admission.      ____________________________________________   FINAL CLINICAL IMPRESSION(S) / ED DIAGNOSES  Final diagnoses:  Altered mental status, unspecified altered mental status type  Korsakoff syndrome [facility])     ED Discharge Orders    None       Note:  This document was prepared using Dragon voice recognition software and may include unintentional dictation errors.   Blake Divine, MD 02/03/19 2325

## 2019-02-03 NOTE — ED Notes (Signed)
Pt difficult stick, 2 attempts by this RN and 2 attempts by Raquel RN. Lab called and notified of specimens re-drawn

## 2019-02-03 NOTE — ED Notes (Signed)
In and out cath attempted, unable to pass catheter. Pt going to CT now, will attempt with coude catheter upon return.

## 2019-02-03 NOTE — ED Notes (Signed)
Hospitalist at bedside 

## 2019-02-04 DIAGNOSIS — F329 Major depressive disorder, single episode, unspecified: Secondary | ICD-10-CM

## 2019-02-04 DIAGNOSIS — G4733 Obstructive sleep apnea (adult) (pediatric): Secondary | ICD-10-CM

## 2019-02-04 DIAGNOSIS — F419 Anxiety disorder, unspecified: Secondary | ICD-10-CM

## 2019-02-04 DIAGNOSIS — F32A Depression, unspecified: Secondary | ICD-10-CM

## 2019-02-04 DIAGNOSIS — Z89511 Acquired absence of right leg below knee: Secondary | ICD-10-CM

## 2019-02-04 DIAGNOSIS — Z89512 Acquired absence of left leg below knee: Secondary | ICD-10-CM

## 2019-02-04 LAB — BASIC METABOLIC PANEL
Anion gap: 9 (ref 5–15)
BUN: 18 mg/dL (ref 8–23)
CO2: 27 mmol/L (ref 22–32)
Calcium: 9.2 mg/dL (ref 8.9–10.3)
Chloride: 107 mmol/L (ref 98–111)
Creatinine, Ser: 1.01 mg/dL (ref 0.61–1.24)
GFR calc Af Amer: >60 mL/min (ref >60)
GFR calc non Af Amer: >60 mL/min (ref >60)
Glucose, Bld: 81 mg/dL (ref 70–99)
Potassium: 3.9 mmol/L (ref 3.5–5.1)
Sodium: 143 mmol/L (ref 135–145)

## 2019-02-04 LAB — CBC
HCT: 47.4 % (ref 39.0–52.0)
Hemoglobin: 15.5 g/dL (ref 13.0–17.0)
MCH: 31 pg (ref 26.0–34.0)
MCHC: 32.7 g/dL (ref 30.0–36.0)
MCV: 94.8 fL (ref 80.0–100.0)
Platelets: 348 10*3/uL (ref 150–400)
RBC: 5 MIL/uL (ref 4.22–5.81)
RDW: 13.3 % (ref 11.5–15.5)
WBC: 9.4 10*3/uL (ref 4.0–10.5)
nRBC: 0 % (ref 0.0–0.2)

## 2019-02-04 LAB — ETHANOL: Alcohol, Ethyl (B): <10 mg/dL (ref <10)

## 2019-02-04 LAB — HIV ANTIBODY (ROUTINE TESTING W REFLEX): HIV Screen 4th Generation wRfx: NONREACTIVE

## 2019-02-04 LAB — SARS CORONAVIRUS 2 (TAT 6-24 HRS): SARS Coronavirus 2: NEGATIVE

## 2019-02-04 LAB — TROPONIN I (HIGH SENSITIVITY): Troponin I (High Sensitivity): 4 ng/L (ref <18)

## 2019-02-04 LAB — MRSA PCR SCREENING: MRSA by PCR: NEGATIVE

## 2019-02-04 MED ORDER — THIAMINE HCL 100 MG/ML IJ SOLN: 100.0000 mg | INTRAMUSCULAR | Status: DC

## 2019-02-04 MED ORDER — ASPIRIN EC 81 MG PO TBEC
81.0000 mg | DELAYED_RELEASE_TABLET | Freq: Every day | ORAL | Status: DC
  Administered 2019-02-04 – 2019-02-05 (×2): 81 mg via ORAL
  Filled 2019-02-04 (×2): qty 1

## 2019-02-04 MED ORDER — THIAMINE HCL 100 MG PO TABS
100.0000 mg | ORAL_TABLET | Freq: Every day | ORAL | Status: DC
  Administered 2019-02-05 (×2): 100 mg via ORAL
  Filled 2019-02-04 (×2): qty 1

## 2019-02-04 MED ORDER — MELATONIN 5 MG PO TABS
5.0000 mg | ORAL_TABLET | Freq: Every day | ORAL | Status: DC
  Administered 2019-02-05: 02:00:00 5 mg via ORAL
  Filled 2019-02-04: qty 1

## 2019-02-04 MED ORDER — ARIPIPRAZOLE 10 MG PO TABS
10.0000 mg | ORAL_TABLET | Freq: Every evening | ORAL | Status: DC
  Administered 2019-02-05: 02:00:00 10 mg via ORAL
  Filled 2019-02-04 (×2): qty 1

## 2019-02-04 MED ORDER — MIRTAZAPINE 15 MG PO TABS
15.0000 mg | ORAL_TABLET | Freq: Every day | ORAL | Status: DC
  Administered 2019-02-05: 02:00:00 15 mg via ORAL
  Filled 2019-02-04: qty 1

## 2019-02-04 MED ORDER — BARRIER CREAM NON-SPECIFIED
1.0000 "application " | TOPICAL_CREAM | TOPICAL | Status: DC | PRN
  Filled 2019-02-04: qty 1

## 2019-02-04 MED ORDER — DULOXETINE HCL 30 MG PO CPEP
60.0000 mg | ORAL_CAPSULE | Freq: Every day | ORAL | Status: DC
  Administered 2019-02-05: 10:00:00 60 mg via ORAL
  Filled 2019-02-04: qty 2

## 2019-02-04 MED ORDER — CLOPIDOGREL BISULFATE 75 MG PO TABS
75.0000 mg | ORAL_TABLET | Freq: Every day | ORAL | Status: DC
  Administered 2019-02-05: 10:00:00 75 mg via ORAL
  Filled 2019-02-04: qty 1

## 2019-02-04 NOTE — ED Notes (Signed)
Pt A/Ox3. Posey bed alarm placed by this RN. Pt calm and cooperative at this time. This Rn will continue to monitor pt.

## 2019-02-04 NOTE — Progress Notes (Signed)
PROGRESS NOTE    Maxwell Webb  MHD:[id number] DOB: September 26, 1950 DOA: 02/03/2019 PCP: [facility]    Assessment & Plan:   Principal Problem:   AMS (altered mental status) Active Problems:   Korsakoff syndrome (HCC)   CVA (cerebral vascular accident) (HCC)   OSA (obstructive sleep apnea)   S/P bilateral BKA (below knee amputation) (HCC)   Depression   Anxiety   Maxwell Webb is a 69 y.o. 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    AMS, resolved --Unclear etiology.  no signs of infection on lab work. CT head negative. Pt might have missed taking his thiamine. He was nonverbal during EDP exam but after IV thiamine he was able to answer my questions, though unlikely that IV thiamine would work that quickly.   UDS, alcohol and ammonia level pending --transition back to PO thiamine   History of CVA Continue aspirin and plavix  Hypertension normotensive  OSA Not CPAP due to financial constraint  Bilateral BKA Wheelchair bound  Depression/anxiety Stable   DVT prophylaxis: Lovenox SQ Code Status: Full code  Family Communication: updated daughter on the phone Disposition Plan: Back to SNF tomorrow   Subjective and Interval History:  Pt was conversant today, said that he does feel more clear-headed now.  No fever, dyspnea, chest pain, abdominal pain, N/V/D, dysuria, increased swelling.  Eating ok.     Objective: Vitals:   02/04/19 1000 02/04/19 1143 02/04/19 1623 02/04/19 1921  BP: (!) 112/91 103/86 114/85 (!) 123/94  Pulse: 91 99 95 98  Resp: 17 18 19 18   Temp:  97.8 F (36.6 C) 97.7 F (36.5 C) 98.3 F (36.8 C)  TempSrc:    Oral  SpO2: 93% 97% 91% 97%  Weight:      Height:        Intake/Output Summary (Last 24 hours) at 02/04/2019 2241 Last data filed at  02/04/2019 1756 Gross per 24 hour  Intake --  Output 0 ml  Net 0 ml   Filed Weights   02/03/19 2033  Weight: 79.4 kg    Examination:   Constitutional: NAD, alert, oriented to person, place and president.  Coherent, conversant, responding appropriately HEENT: conjunctivae and lids normal, EOMI CV: RRR no M,R,G. Distal pulses +2.  No cyanosis.   RESP: CTA B/L, normal respiratory effort  GI: +BS, NTND Extremities: Bilateral BKA SKIN: warm, dry and intact Neuro: II - XII grossly intact.  Sensation intact Psych: Flat mood and affect.     Data Reviewed: I have personally reviewed following labs and imaging studies  CBC: Recent Labs  Lab 02/03/19 2035 02/04/19 0040  WBC 7.1 9.4  NEUTROABS 3.7  --   HGB 15.7 15.5  HCT 48.1 47.4  MCV 94.7 94.8  PLT 339 348   Basic Metabolic Panel: Recent Labs  Lab 02/03/19 2035 02/04/19 0040  NA 141 143  K 3.8 3.9  CL 108 107  CO2 21* 27  GLUCOSE 76 81  BUN 17 18  CREATININE 0.93 1.01  CALCIUM 9.2 9.2  MG 2.3  --    GFR: Estimated Creatinine Clearance: 72.7 mL/min (by C-G formula based on SCr of 1.01 mg/dL). Liver Function Tests: Recent Labs  Lab 02/03/19 2035  AST 42*  ALT 29  ALKPHOS 82  BILITOT 0.9  PROT 8.1  ALBUMIN 3.4*   No results for input(s): LIPASE, AMYLASE  in the last 168 hours. Recent Labs  Lab 02/03/19 2035  AMMONIA 33   Coagulation Profile: No results for input(s): INR, PROTIME in the last 168 hours. Cardiac Enzymes: No results for input(s): CKTOTAL, CKMB, CKMBINDEX, TROPONINI in the last 168 hours. BNP (last 3 results) No results for input(s): PROBNP in the last 8760 hours. HbA1C: No results for input(s): HGBA1C in the last 72 hours. CBG: No results for input(s): GLUCAP in the last 168 hours. Lipid Profile: No results for input(s): CHOL, HDL, LDLCALC, TRIG, CHOLHDL, LDLDIRECT in the last 72 hours. Thyroid Function Tests: No results for input(s): TSH, T4TOTAL, FREET4, T3FREE, THYROIDAB in the  last 72 hours. Anemia Panel: No results for input(s): VITAMINB12, FOLATE, FERRITIN, TIBC, IRON, RETICCTPCT in the last 72 hours. Sepsis Labs: No results for input(s): PROCALCITON, LATICACIDVEN in the last 168 hours.  Recent Results (from the past 240 hour(s))  SARS CORONAVIRUS 2 (TAT 6-24 HRS) Nasopharyngeal Nasopharyngeal Swab     Status: None   Collection Time: 02/03/19 10:41 PM   Specimen: Nasopharyngeal Swab  Result Value Ref Range Status   SARS Coronavirus 2 NEGATIVE NEGATIVE Final    Comment: (NOTE) SARS-CoV-2 target nucleic acids are NOT DETECTED. The SARS-CoV-2 RNA is generally detectable in upper and lower respiratory specimens during the acute phase of infection. Negative results do not preclude SARS-CoV-2 infection, do not rule out co-infections with other pathogens, and should not be used as the sole basis for treatment or other patient management decisions. Negative results must be combined with clinical observations, patient history, and epidemiological information. The expected result is Negative. Fact Sheet for Patients: SugarRoll.be Fact Sheet for Healthcare Providers: https://www.woods-mathews.com/ This test is not yet approved or cleared by the [place] FDA and  has been authorized for detection and/or diagnosis of SARS-CoV-2 by FDA under an Emergency Use Authorization (EUA). This EUA will remain  in effect (meaning this test can be used) for the duration of the COVID-19 declaration under Section 56 4(b)(1) of the Act, 21 U.S.C. section 360bbb-3(b)(1), unless the authorization is terminated or revoked sooner. Performed at [facility], [place] [address], [place], [place] [address]   MRSA PCR Screening     Status: None   Collection Time: 02/04/19  1:55 PM   Specimen: Nasal Mucosa; Nasopharyngeal  Result Value Ref Range Status   MRSA by PCR NEGATIVE NEGATIVE Final    Comment:        The GeneXpert MRSA Assay  (FDA approved for NASAL specimens only), is one component of a comprehensive MRSA colonization surveillance program. It is not intended to diagnose MRSA infection nor to guide or monitor treatment for MRSA infections. Performed at [facility], [address], [place], [place] [address]       Radiology Studies: CT Head Wo Contrast  Result Date: 02/03/2019 CLINICAL DATA:  Altered mental status. EXAM: CT HEAD WITHOUT CONTRAST TECHNIQUE: Contiguous axial images were obtained from the base of the skull through the vertex without intravenous contrast. COMPARISON:  January 14, 2019. FINDINGS: Brain: No evidence of acute infarction, hemorrhage, hydrocephalus, extra-axial collection or mass lesion/mass effect. Moderate atrophy and chronic microvascular ischemic changes are noted. Vascular: No hyperdense vessel or unexpected calcification. Skull: Normal. Negative for fracture or focal lesion. Sinuses/Orbits: There is mucosal thickening of the ethmoid air cells and right sphenoid sinus. Otherwise, the remaining paranasal sinuses and mastoid air cells are essentially clear. Other: None. IMPRESSION: 1. No evidence of acute intracranial process. 2. Moderate atrophy and chronic microvascular  ischemic changes of the white matter. Electronically Signed   By: Katherine Mantle M.D.   On: 02/03/2019 21:13   DG Chest Portable 1 View  Result Date: 02/03/2019 CLINICAL DATA:  Weakness EXAM: PORTABLE CHEST 1 VIEW COMPARISON:  None. FINDINGS: The heart size and mediastinal contours are within normal limits. Both lungs are clear. The visualized skeletal structures are unremarkable. IMPRESSION: No active disease. Electronically Signed   By: Deatra Robinson M.D.   On: 02/03/2019 21:17     Scheduled Meds: . aspirin EC  81 mg Oral Daily  . enoxaparin (LOVENOX) injection  40 mg Subcutaneous Q24H  . [START ON 02/05/2019] thiamine injection  100 mg Intravenous Q24H   Continuous Infusions:   LOS: 1 day      Darlin Priestly, MD [facility] If 7PM-7AM, please contact night-coverage 02/04/2019, 10:41 PM

## 2019-02-04 NOTE — H&P (Signed)
History and Physical    Cayetano Mikita AVW:[id number] DOB: 02/07/1950 DOA: 02/03/2019  PCP: Almetta Lovely, [facility]  Patient coming from: [facility]  I have personally briefly reviewed patient's old medical records in [facility]  Chief Complaint: Altered mental status  HPI: Maxwell Webb is a 69 y.o. 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  Pt unable to recall any history. History obtained in entirety from ED physician report and documentation. Patient reportedly was found in facility to have altered mental status prior to obtaining his evening medication.  Reportedly at baseline he is verbal but staff found him to be nonverbal and seemed confused.  Unknown when patient was last normal and staff at facility was unsure whether he received his morning or new medication. During ED physician evaluation, patient reportedly was nonverbal and staring off into the distance and not making any eye contact.  He was given a dose of IV thiamine and at the time of my evaluation patient continues to not make much eye contact but was able to answer simple questions and follow commands.  He was only able to recall his name but was not oriented to time, place, and current event.  He does not recall anything surrounding his admission. Denies any frank pain.   He was afebrile normotensive on room air.  Lab work was unremarkable with alcohol level, UDS and ammonia pending. CT head and chest x-ray were negative.  Review of Systems:  Unable to fully obtain due to patient's altered mental status  Past Medical History:  Diagnosis Date   Anxiety    Depression    Hyperlipemia    Hypertension    Korsakoff disease (HCC)    Nicotine abuse    Osteomyelitis (HCC)    PVD (peripheral vascular disease) (HCC)    Stroke (HCC)     Wernicke encephalopathy     Past Surgical History:  Procedure Laterality Date   BELOW KNEE LEG AMPUTATION Bilateral    FINGER SURGERY     left index finger - as a child     reports that he has been smoking cigarettes. He has a 20.00 pack-year smoking history. He has never used smokeless tobacco. He reports that he does not drink alcohol or use drugs.  No Known Allergies  Family History  Problem Relation Age of Onset   Hypertension Mother    Other Mother        brain bleed from accidental fall on ice - age 23   Diabetes Father      Prior to Admission medications   Medication Sig Start Date End Date Taking? Authorizing Provider  acetaminophen (TYLENOL) 325 MG tablet Take 650 mg by mouth every 4 (four) hours as needed for mild pain, moderate pain or headache.    [provider]  alum & mag hydroxide-simeth (MINTOX) 200-200-20 MG/5ML suspension Take 30 mLs by mouth 4 (four) times daily as needed for indigestion or heartburn.    [provider]  ARIPiprazole (ABILIFY) 15 MG tablet Take 10 mg by mouth every evening.     [provider]  aspirin EC 81 MG EC tablet Take 1 tablet (81 mg total) by mouth daily. 02/18/16   Ramonita Lab, MD  atorvastatin (LIPITOR) 40 MG tablet Take 1 tablet (40 mg total) by mouth daily at 6 PM. Patient taking differently: Take 40 mg by mouth  every evening.  01/05/16   Demetrios Loll, MD  barrier cream (NON-SPECIFIED) CREA Apply 1 application topically as needed (skin protection).    [provider]  clopidogrel (PLAVIX) 75 MG tablet Take 1 tablet (75 mg total) by mouth daily. 02/18/16   Nicholes Mango, MD  diphenhydrAMINE (BENADRYL) 25 MG tablet Take 25 mg by mouth every 6 (six) hours as needed for allergies.    [provider]  DULoxetine (CYMBALTA) 60 MG capsule Take 60 mg by mouth 2 (two) times daily.     [provider]  folic acid (FOLVITE) 1 MG tablet Take 1 mg by mouth daily.    [provider]    gabapentin (NEURONTIN) 100 MG capsule Take 100 mg by mouth at bedtime.     [provider]  guaiFENesin (ROBITUSSIN) 100 MG/5ML SOLN Take 15 mLs by mouth every 6 (six) hours as needed for cough or to loosen phlegm.    [provider]  ibuprofen (ADVIL,MOTRIN) 200 MG tablet Take 400 mg by mouth every 6 (six) hours as needed for mild pain. (max 2000mg  in 24 hours)    [provider]  loperamide (IMODIUM) 2 MG capsule Take 4 mg by mouth as needed for diarrhea or loose stools. (max 8 doses per 24 hours)    [provider]  magnesium hydroxide (MILK OF MAGNESIA) 400 MG/5ML suspension Take 30 mLs by mouth daily as needed for mild constipation or moderate constipation.    [provider]  Melatonin 3 MG TABS Take 3 mg by mouth at bedtime.    [provider]  menthol-zinc oxide (GOLD BOND) powder Apply 1 application topically daily as needed. (powder or lotion)    [provider]  mirtazapine (REMERON) 15 MG tablet Take 15 mg by mouth at bedtime.    [provider]  pyrithione zinc (HEAD AND SHOULDERS) 1 % shampoo Apply 1 application topically daily as needed for itching. (T-Gel shampoo also acceptable)    [provider]  thiamine (VITAMIN B-1) 100 MG tablet Take 100 mg by mouth daily.     [provider]  vitamin B-12 (CYANOCOBALAMIN) 1000 MCG tablet Take 1,000 mcg by mouth daily.    [provider]    Physical Exam: Vitals:   02/03/19 2200 02/03/19 2230 02/03/19 2300 02/03/19 2358  BP:    107/83  Pulse: 84 74 69 71  Resp: 17 17 17 13   Temp:    98.9 F (37.2 C)  TempSrc:    Oral  SpO2: 92% 94% 97% 96%  Weight:      Height:        Constitutional: NAD, calm, comfortable, sitting upright in bed and staring off in the distance Vitals:   02/03/19 2200 02/03/19 2230 02/03/19 2300 02/03/19 2358  BP:    107/83  Pulse: 84 74 69 71  Resp: 17 17 17 13   Temp:    98.9 F (37.2 C)  TempSrc:    Oral   SpO2: 92% 94% 97% 96%  Weight:      Height:       Eyes: PERRL, lids and conjunctivae normal with greenish discharge on left eye ENMT: Mucous membranes are moist. Neck: normal, supple,  Respiratory: clear to auscultation bilaterally, no wheezing, no crackles. Normal respiratory effort. Pt required 2 person assist to help him get up. Cardiovascular: Regular rate and rhythm, no murmurs / rubs / gallops. No extremity edema.  Abdomen: no tenderness, no masses palpated.  Bowel sounds positive.  Musculoskeletal: no clubbing / cyanosis. Bilateral BKA.  Skin: no rashes, lesions, ulcers. No induration Neurologic: CN 2-12 grossly intact. Alert and oriented only to name. Able to answer simple questions and follows commands but does not make eye contact and stares off into distance. Able to track fingers without nystagmus. Intact finger to nose. Able to number quantity of fingers being held up.  Strength 5/5 in all 4.  Psychiatric: alert but disoriented     Labs on Admission: I have personally reviewed following labs and imaging studies  CBC: Recent Labs  Lab 02/03/19 2035  WBC 7.1  NEUTROABS 3.7  HGB 15.7  HCT 48.1  MCV 94.7  PLT 339   Basic Metabolic Panel: Recent Labs  Lab 02/03/19 2035  NA 141  K 3.8  CL 108  CO2 21*  GLUCOSE 76  BUN 17  CREATININE 0.93  CALCIUM 9.2  MG 2.3   GFR: Estimated Creatinine Clearance: 78.9 mL/min (by C-G formula based on SCr of 0.93 mg/dL). Liver Function Tests: Recent Labs  Lab 02/03/19 2035  AST 42*  ALT 29  ALKPHOS 82  BILITOT 0.9  PROT 8.1  ALBUMIN 3.4*   No results for input(s): LIPASE, AMYLASE in the last 168 hours. Recent Labs  Lab 02/03/19 2035  AMMONIA 33   Coagulation Profile: No results for input(s): INR, PROTIME in the last 168 hours. Cardiac Enzymes: No results for input(s): CKTOTAL, CKMB, CKMBINDEX, TROPONINI in the last 168 hours. BNP (last 3 results) No results for input(s): PROBNP in the last 8760  hours. HbA1C: No results for input(s): HGBA1C in the last 72 hours. CBG: No results for input(s): GLUCAP in the last 168 hours. Lipid Profile: No results for input(s): CHOL, HDL, LDLCALC, TRIG, CHOLHDL, LDLDIRECT in the last 72 hours. Thyroid Function Tests: No results for input(s): TSH, T4TOTAL, FREET4, T3FREE, THYROIDAB in the last 72 hours. Anemia Panel: No results for input(s): VITAMINB12, FOLATE, FERRITIN, TIBC, IRON, RETICCTPCT in the last 72 hours. Urine analysis:    Component Value Date/Time   COLORURINE YELLOW (A) 06/30/2017 1641   APPEARANCEUR CLEAR (A) 06/30/2017 1641   LABSPEC 1.012 06/30/2017 1641   PHURINE 6.0 06/30/2017 1641   GLUCOSEU NEGATIVE 06/30/2017 1641   HGBUR NEGATIVE 06/30/2017 1641   BILIRUBINUR NEGATIVE 06/30/2017 1641   KETONESUR NEGATIVE 06/30/2017 1641   PROTEINUR NEGATIVE 06/30/2017 1641   NITRITE NEGATIVE 06/30/2017 1641   LEUKOCYTESUR NEGATIVE 06/30/2017 1641    Radiological Exams on Admission: CT Head Wo Contrast  Result Date: 02/03/2019 CLINICAL DATA:  Altered mental status. EXAM: CT HEAD WITHOUT CONTRAST TECHNIQUE: Contiguous axial images were obtained from the base of the skull through the vertex without intravenous contrast. COMPARISON:  January 14, 2019. FINDINGS: Brain: No evidence of acute infarction, hemorrhage, hydrocephalus, extra-axial collection or mass lesion/mass effect. Moderate atrophy and chronic microvascular ischemic changes are noted. Vascular: No hyperdense vessel or unexpected calcification. Skull: Normal. Negative for fracture or focal lesion. Sinuses/Orbits: There is mucosal thickening of the ethmoid air cells and right sphenoid sinus. Otherwise, the remaining paranasal sinuses and mastoid air cells are essentially clear. Other: None. IMPRESSION: 1. No evidence of acute intracranial process. 2. Moderate atrophy and chronic microvascular ischemic changes of the white matter. Electronically Signed   By: Katherine Mantle M.D.    On: 02/03/2019 21:13   DG Chest Portable 1 View  Result Date: 02/03/2019 CLINICAL DATA:  Weakness EXAM: PORTABLE CHEST 1 VIEW COMPARISON:  None. FINDINGS: The heart size and mediastinal contours are within normal limits.  Both lungs are clear. The visualized skeletal structures are unremarkable. IMPRESSION: No active disease. Electronically Signed   By: Deatra Robinson M.D.   On: 02/03/2019 21:17    EKG: Independently reviewed.   Assessment/Plan  AMS likely secondary to worsening Korsakoff syndrome no signs of infection on lab work. CT head negative. Pt might have missed taking his thiamine. He was nonverbal during EDP exam but after IV thiamine he was able to answer my questions UDS, alcohol and ammonia level pending will continue IV thiamine and can transfer back to PO once he passes bedside swallow  History of CVA Continue aspirin  Hypertension normotensive  OSA Not CPAP due to financial constraint  Bilateral BKA Wheelchair bound  Depression/anxiety Stable  Medication reconciliation was incomplete at the time of admission.  Will need to follow-up with nursing facility in the morning as patient is unable to list his medications.  DVT prophylaxis:.Lovenox Code Status: Full Family Communication: Plan discussed with patient at bedside  disposition Plan: SNF with at least 2 midnight stays  Consults called:  Admission status: inpatient  Shaunna Rosetti T Ivannia Willhelm DO [facility]   If 7PM-7AM, please contact night-coverage [email or web address]   02/04/2019, 12:25 AM

## 2019-02-04 NOTE — Progress Notes (Signed)
Received a call from [facility], who state patient is one of their wards. Individual wanted a general update, which was given. Claims that the patient's current mentation is close to baseline. Also claims to know patient's daughter Maxwell Webb, who APS rep states she will call and give an update to shortly. I informed her I have already requested that the doctor call Maxwell Webb and give her an update as well. Jari Favre [facility]

## 2019-02-04 NOTE — Progress Notes (Signed)
It appears patient still needs a urine sample sent to lab. Earlier patient had a large episode of urinary incontinence, since then patient has had a condom catheter on, but has not yet urinated again. Will continue to monitor and pass along in shift report. Jari Favre [facility]

## 2019-02-04 NOTE — Progress Notes (Signed)
Report given to [facility]. Patient to be transported to 2A.

## 2019-02-04 NOTE — ED Notes (Signed)
Lab called this RN to inform that the previously drawn lab specimens hemolyzed for the 2nd time. Lab was asked to please send a tech to attempt a recollect. Lab stated that they would send someone up to recollect.

## 2019-02-04 NOTE — Progress Notes (Signed)
Attempted to call [facility] and legal guardian to complete admission, unable to get in touch with anyone. Will try again later. Gery Pray

## 2019-02-04 NOTE — Evaluation (Addendum)
Clinical/Bedside Swallow Evaluation Patient Details  Name: Maxwell Webb MRN: [id number] Date of Birth: January 27, 1950  Today's Date: 02/04/2019 Time: SLP Start Time (ACUTE ONLY): 1440 SLP Stop Time (ACUTE ONLY): 1520 SLP Time Calculation (min) (ACUTE ONLY): 40 min  Past Medical History:  Past Medical History:  Diagnosis Date  . Anxiety   . Depression   . Hyperlipemia   . Hypertension   . Korsakoff disease ([place])   . Nicotine abuse   . Osteomyelitis ([place])   . PVD (peripheral vascular disease) ([place])   . Stroke ([place])   . Wernicke encephalopathy    Past Surgical History:  Past Surgical History:  Procedure Laterality Date  . BELOW KNEE LEG AMPUTATION Bilateral   . FINGER SURGERY     left index finger - as a child   HPI:  Pt is a 69 y.o. male with medical history significant of alcohol abuse with Wernicke's and Korsakoff syndrome, history of CVA, hypertension, OSA, bilateral BKA, depression/anxiety who presents with concerns of altered mental status.  CXR: "No active disease.". Head CT: "no active disease; Moderate atrophy and chronic microvascular ischemic changes". Pt improved in overt status this PM per notes - he is answering general questions, awake.  Assessment / Plan / Recommendation Clinical Impression  Pt appeared to present w/ adequate oropharyngeal phase swallow function w/ no overt Clinical s/s of aspiration noted during oral intake. He is at reduced risk for aspiration when following general aspiration precautions. Pt has a baseline of Cognitive-linguistic deficits d/t Wernicke encephalopathy and Korsakoff syndrome -- this may be impacting his overall Cognitive-linguistic engagement and verbal output at this time. Pt required min guidance in positioning himself upright for oral intake; instruction and guidance on general aspiration precautions. Pt helped to feed self consuming trials of Thin liquids via Straw, puree and soft solids. No overt s/s of aspiration noted; no wet vocal  quality or decline in respiratory status noted during/post trials. Oral phase was grossly [facility] for bolus management, A-P transfer, and oral clearing w/ all trials given. OM exam was [facility]. Pt was min impulsive in his drinking behavior -- education given on slowing down, smaller sips. Unsure of pt's Cognitive functioning for carry-over of instruction.   Recommend continue current diet w/ meats cut for him, Thin liquids. General aspiration precautions; tray setup at meals and Monitoring at meals for any cues/needs. No further skilled ST services indicated at this time. NSG to reconsult if any needs while admitted.  SLP Visit Diagnosis: Dysphagia, unspecified (R13.10)    Aspiration Risk   reduced following general precautions   Diet Recommendation  Regular diet w/ meats cut, gravies added to moisten; Thin liquids. Monitoring at meals for follow through w/ aspiration precautions d/t Cognitive-linguistic deficits/decline baseline  Medication Administration: Whole meds with puree(if needed for safer swallowing)    Other  Recommendations Recommended Consults: (Dietician f/u) Oral Care Recommendations: Oral care BID;Staff/trained caregiver to provide oral care Other Recommendations: (n/a)   Follow up Recommendations None      Frequency and Duration (n/a)  (n/a)       Prognosis Prognosis for Safe Diet Advancement: Good Barriers to Reach Goals: Language deficits;Cognitive deficits;Severity of deficits;Time post onset(baseline)      Swallow Study   General Date of Onset: 02/03/19 HPI: Pt is a 69 y.o. male with medical history significant of alcohol abuse with Wernicke's and Korsakoff syndrome, history of CVA, hypertension, OSA, bilateral BKA, depression/anxiety who presents with concerns of altered mental status.  CXR: "No active disease.". Head CT: "  no active disease; Moderate atrophy and chronic microvascular ischemic changes".  Type of Study: Bedside Swallow Evaluation Previous Swallow  Assessment: none noted in chart notes; language eval in 2017 Diet Prior to this Study: Regular;Thin liquids Temperature Spikes Noted: No(wbc 9.4) Respiratory Status: Room air History of Recent Intubation: No Behavior/Cognition: Alert;Cooperative;Pleasant mood;Distractible;Requires cueing(baseline Cognitive-linguistic deficits) Oral Cavity Assessment: Within Functional Limits Oral Care Completed by SLP: Yes Oral Cavity - Dentition: Adequate natural dentition Vision: Functional for self-feeding Self-Feeding Abilities: Able to feed self;Needs set up;Needs assist(support) Patient Positioning: Upright in bed(needed positioning support) Baseline Vocal Quality: Normal;Low vocal intensity(adequate) Volitional Cough: Strong(w/ cues) Volitional Swallow: Able to elicit(time)    Oral/Motor/Sensory Function Overall Oral Motor/Sensory Function: Within functional limits   Ice Chips Ice chips: Not tested   Thin Liquid Thin Liquid: Within functional limits Presentation: Self Fed;Straw(~8+ ozs) Other Comments: seemed to drink impulsively    Nectar Thick Nectar Thick Liquid: Not tested   Honey Thick Honey Thick Liquid: Not tested   Puree Puree: Within functional limits Presentation: Spoon;Self Fed(supported; 8 trials)   Solid     Solid: Within functional limits(grossly wfl) Presentation: Spoon(fed; 4 trials) Other Comments: prepped the solids       Jerilynn Som, MS, CCC-SLP [place]ette Lackie 02/04/2019,3:36 PM

## 2019-02-04 NOTE — ED Notes (Signed)
Pt now more alert and requesting something to drink. NP Jon Billings was updated on pts improving condition./ Pt was informed he is currently on a NPO diet/. Will re-assess with SLP

## 2019-02-04 NOTE — Plan of Care (Signed)
  Problem: Education: Goal: Knowledge of General Education information will improve Description: Including pain rating scale, medication(s)/side effects and non-pharmacologic comfort measures Outcome: Not Progressing Note: Patient only minimally responsive, from a facility, incontinent. Per notes mentation isn't patient's baseline. However, head CT was negative for any acute change. Will continue to monitor mental status for the remainder of the shift. Jari Favre [facility]

## 2019-02-04 NOTE — ED Notes (Signed)
Pt lying in bed awake exhibiting no signs of acute distress. PT stable with VS WNL. Pt denies pain and is clean and dry. Nothing needed from staff at this time

## 2019-02-05 LAB — MAGNESIUM: Magnesium: 2 mg/dL (ref 1.7–2.4)

## 2019-02-05 LAB — CBC
HCT: 44.8 % (ref 39.0–52.0)
Hemoglobin: 14.8 g/dL (ref 13.0–17.0)
MCH: 31 pg (ref 26.0–34.0)
MCHC: 33 g/dL (ref 30.0–36.0)
MCV: 93.7 fL (ref 80.0–100.0)
Platelets: 333 10*3/uL (ref 150–400)
RBC: 4.78 MIL/uL (ref 4.22–5.81)
RDW: 13.1 % (ref 11.5–15.5)
WBC: 11.2 10*3/uL — ABNORMAL HIGH (ref 4.0–10.5)
nRBC: 0 % (ref 0.0–0.2)

## 2019-02-05 LAB — BASIC METABOLIC PANEL
Anion gap: 11 (ref 5–15)
BUN: 21 mg/dL (ref 8–23)
CO2: 25 mmol/L (ref 22–32)
Calcium: 9.2 mg/dL (ref 8.9–10.3)
Chloride: 105 mmol/L (ref 98–111)
Creatinine, Ser: 0.87 mg/dL (ref 0.61–1.24)
GFR calc Af Amer: >60 mL/min (ref >60)
GFR calc non Af Amer: >60 mL/min (ref >60)
Glucose, Bld: 76 mg/dL (ref 70–99)
Potassium: 3.5 mmol/L (ref 3.5–5.1)
Sodium: 141 mmol/L (ref 135–145)

## 2019-02-05 MED ORDER — ATORVASTATIN CALCIUM 40 MG PO TABS: 40.0000 mg | ORAL_TABLET | Freq: Every day | ORAL | Status: DC

## 2019-02-05 NOTE — TOC Initial Note (Signed)
Transition of Care [name]) - Initial/Assessment Note    Patient Details  Name: Maxwell Webb MRN: [id number] Date of Birth: 03-26-1950  Transition of [facility]) CM/SW Contact:    Maree Krabbe, LCSW Phone Number: 02/05/2019, 10:33 AM  Clinical Narrative:  Pt is only alert to self. CSW spoke with pt's APS worker Arthur Holms [phone]). DSS is pt's legal guardian. Pt's APS worker confirmed pt is from [facility] and will return there at d/c. CSW confirmed with [facility] that pt can return today.                 Expected Discharge Plan: Assisted Living Barriers to Discharge: No Barriers Identified   Patient Goals and CMS Choice Patient states their goals for this hospitalization and ongoing recovery are:: pt to return to ALF- per Guardian      Expected Discharge Plan and Services Expected Discharge Plan: Assisted Living In-house Referral: NA   Post Acute Care Choice: Home Health Living arrangements for the past 2 months: Assisted Living Facility Expected Discharge Date: 02/05/19                                    Prior Living Arrangements/Services Living arrangements for the past 2 months: Assisted Living Facility Lives with:: Self Patient language and need for interpreter reviewed:: Yes Do you feel safe going back to the place where you live?: Yes      Need for Family Participation in Patient Care: Yes (Comment) Care giver support system in place?: Yes (comment)   Criminal Activity/Legal Involvement Pertinent to Current Situation/Hospitalization: No - Comment as needed  Activities of Daily Living      Permission Sought/Granted Permission sought to share information with : Guardian    Share Information with NAME: CHristina Doss  Permission granted to share info w AGENCY: [employer] granted to share info w Relationship: Guardian  Permission granted to share info w Contact Information: [phone]  Emotional Assessment Appearance:: Appears stated  age Attitude/Demeanor/Rapport: Unable to Assess Affect (typically observed): Unable to Assess Orientation: : Oriented to Self Alcohol / Substance Use: Not Applicable Psych Involvement: No (comment)  Admission diagnosis:  Korsakoff syndrome (HCC) [F04] Altered mental status, unspecified altered mental status type [R41.82] AMS (altered mental status) [R41.82] Patient Active Problem List   Diagnosis Date Noted  . OSA (obstructive sleep apnea) 02/04/2019  . S/P bilateral BKA (below knee amputation) (HCC) 02/04/2019  . Depression 02/04/2019  . Anxiety 02/04/2019  . AMS (altered mental status) 02/03/2019  . Numbness and tingling in left hand 04/17/2016  . CVA (cerebral vascular accident) (HCC) 02/16/2016  . TIA (transient ischemic attack) 01/04/2016  . 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 Korsakoff syndrome (HCC) 12/16/2015   PCP:  Almetta Lovely, [facility] Pharmacy:   [facility] - [place] - [address] [place] [address] Phone: [phone] Fax: [phone]  [facility] - [place], [place] - [address] [place] [address] Phone: [phone] Fax: [phone]     Social Determinants of Health (SDOH) Interventions    Readmission Risk Interventions No flowsheet data found.

## 2019-02-05 NOTE — Progress Notes (Signed)
Called and checked on patient at [facility]-  Since no one answered when I tried to call report.   Patient made it there safely.  They did not have any further question for me.

## 2019-02-05 NOTE — Progress Notes (Addendum)
Patient is unsure if he had his Flu vaccine this year

## 2019-02-05 NOTE — Progress Notes (Signed)
Sat patient upright while giving medications

## 2019-02-05 NOTE — Discharge Summary (Signed)
Physician Discharge Summary   Maxwell Webb  male DOB: March 12, 1950  VHQ:[id number]  PCP: [facility]  Admit date: 02/03/2019 Discharge date: 02/05/2019  Admitted From: SNF Disposition:  SNF Daughter updated the night before about the discharge plans.  CODE STATUS: Full code  Discharge Instructions    Diet - low sodium heart healthy   Complete by: As directed    Increase activity slowly   Complete by: As directed        Hospital Course:  For full details, please see H&P, progress notes, consult notes and ancillary notes.  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    AMS, resolved Unclear etiology.  no signs of infection on lab work. No metabolic derangement.  CT head negative. Pt might have missed taking his thiamine. He was nonverbal during EDP exam but after IV thiamine he was able to answer my questions, though unlikely that IV thiamine would work that quickly.  Pt may also have chosen to be less responsive at the SNF prior to arrival.  Pt was alert, oriented and coherent the day after admission.  Transitioned back to PO thiamine.  History of CVA Continued aspirin and plavix  Hx of Hypertension, not currently active normotensive during hospitalization.  Not currently on BP meds.  OSA Not CPAP due to financial constraint  Bilateral BKA Wheelchair bound  Depression/anxiety Stable.  Continued home Abilify, Cymbalta, Remeron.   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 Notes to patient: Never given   ARIPiprazole 10 MG tablet Commonly known as: ABILIFY Take 10 mg by mouth every evening.   aspirin 81 MG EC tablet Take 1 tablet (81 mg total) by mouth daily.   atorvastatin 40 MG tablet Commonly known as: LIPITOR Take 1 tablet (40 mg total) by mouth daily.   barrier cream Crea Commonly known as: non-specified Apply 1 application topically as needed (skin protection).   clopidogrel 75 MG tablet Commonly known as: PLAVIX Take 1 tablet (75 mg total) by mouth daily.   diphenhydrAMINE 25 MG tablet Commonly known as: BENADRYL Take 25 mg by mouth every 6 (six) hours as needed for allergies. Notes to patient: Not given here   DULoxetine 60 MG capsule Commonly known as: CYMBALTA Take 60 mg by mouth daily.   folic acid 1 MG tablet Commonly known as: FOLVITE Take 1 mg by mouth daily.   guaiFENesin 100 MG/5ML Soln Commonly known as: ROBITUSSIN Take 15 mLs by mouth every 6 (six) hours as needed for cough or to loosen phlegm. Notes to patient: Not given here   ibuprofen 200 MG tablet Commonly known as: ADVIL Take 400 mg by mouth every 6 (six) hours as needed for mild pain. (max 2000mg  in 24 hours) Notes to patient: Not given here   loperamide 2  MG capsule Commonly known as: IMODIUM Take 4 mg by mouth as needed for diarrhea or loose stools. (max 8 doses per 24 hours) Notes to patient: Not given here    magnesium hydroxide 400 MG/5ML suspension Commonly known as: MILK OF MAGNESIA Take 30 mLs by mouth daily as needed for mild constipation or moderate constipation. Notes to patient: Not given here   Melatonin 3 MG Tabs Take 3 mg by mouth at bedtime.   menthol-zinc oxide  powder Apply 1 application topically daily as needed. (powder or lotion) Notes to patient: Not given here    Mintox 200-200-20 MG/5ML suspension Generic drug: alum & mag hydroxide-simeth Take 30 mLs by mouth 4 (four) times daily as needed for indigestion or heartburn. Notes to patient: Not given here   mirtazapine 15 MG tablet Commonly known as: REMERON Take 15 mg by mouth at bedtime.   pyrithione zinc 1 % shampoo Commonly known as: HEAD AND SHOULDERS Apply 1 application topically daily as needed for itching. (T-Gel shampoo also acceptable) Notes to patient: Not given here   THERATRUM COMPLETE 50 PLUS PO Take 1 tablet by mouth daily.   thiamine 100 MG tablet Commonly known as: VITAMIN B-1 Take 100 mg by mouth daily.   Vitamin D 50 MCG (2000 UT) tablet Take 4,000 Units by mouth daily.   Zinc 50 MG Tabs Take 100 mg by mouth daily.       Follow-up Information    [facility] Schedule an appointment as soon as possible for a visit in 1 week(s).   Specialty: Geriatric Medicine Contact information: [address] [place] [address] [phone]           No Known Allergies   The results of significant diagnostics from this hospitalization (including imaging, microbiology, ancillary and laboratory) are listed below for reference.   Consultations:   Procedures/Studies: CT Head Wo Contrast  Result Date: 02/03/2019 CLINICAL DATA:  Altered mental status. EXAM: CT HEAD WITHOUT CONTRAST TECHNIQUE: Contiguous axial images were obtained from the base of the skull through the vertex without intravenous contrast. COMPARISON:  January 14, 2019. FINDINGS: Brain: No evidence of acute infarction, hemorrhage, hydrocephalus, extra-axial collection or mass lesion/mass effect. Moderate atrophy and chronic microvascular ischemic changes are noted. Vascular: No hyperdense vessel or unexpected calcification. Skull: Normal. Negative for fracture or focal  lesion. Sinuses/Orbits: There is mucosal thickening of the ethmoid air cells and right sphenoid sinus. Otherwise, the remaining paranasal sinuses and mastoid air cells are essentially clear. Other: None. IMPRESSION: 1. No evidence of acute intracranial process. 2. Moderate atrophy and chronic microvascular ischemic changes of the white matter. Electronically Signed   By: Katherine Mantle M.D.   On: 02/03/2019 21:13   CT Head Wo Contrast  Result Date: 01/14/2019 CLINICAL DATA:  Facial trauma status post fall EXAM: CT HEAD WITHOUT CONTRAST CT MAXILLOFACIAL WITHOUT CONTRAST TECHNIQUE: Multidetector CT imaging of the head and maxillofacial structures were performed using the standard protocol without intravenous contrast. Multiplanar CT image reconstructions of the maxillofacial structures were also generated. COMPARISON:  06/30/2017 FINDINGS: CT HEAD FINDINGS Brain: No evidence of acute infarction, hemorrhage, extra-axial collection, ventriculomegaly, or mass effect. Generalized cerebral atrophy. Periventricular white matter low attenuation likely secondary to microangiopathy. Vascular: Cerebrovascular atherosclerotic calcifications are noted. Skull: Negative for fracture or focal lesion. Sinuses/Orbits: Visualized portions of the orbits are unremarkable. Visualized portions of the paranasal sinuses are unremarkable. Visualized portions of the mastoid air cells are unremarkable. Other: None. CT MAXILLOFACIAL FINDINGS Osseous:  No fracture or mandibular dislocation. No destructive process. Degenerative disease with disc height loss at C5-6. Moderate right facet arthropathy at C2-3 and C3-4. Orbits: Negative. No traumatic or inflammatory finding. Sinuses: Right maxillary sinus mucosal thickening. No paranasal sinus air-fluid level. Soft tissues: Negative. IMPRESSION: 1. No acute intracranial pathology. 2.  No acute osseous injury of the maxillofacial bones. Electronically Signed   By: Elige Ko   On: 01/14/2019  16:36   CT Cervical Spine Wo Contrast  Result Date: 01/14/2019 CLINICAL DATA:  Fall EXAM: CT CERVICAL SPINE WITHOUT CONTRAST TECHNIQUE: Multidetector CT imaging of the cervical spine was performed without intravenous contrast. Multiplanar CT image reconstructions were also generated. COMPARISON:  None. FINDINGS: Alignment: Straightening of the cervical lordosis. There is trace anterolisthesis at C3-C4 and retrolisthesis at C5-C6. Skull base and vertebrae: Vertebral body heights are maintained apart from degenerative endplate irregularity primarily at C5-C6 and C6-C7. Soft tissues and spinal canal: No prevertebral fluid or swelling. No visible canal hematoma. Disc levels: Multilevel degenerative changes are present including disc space narrowing, endplate osteophytes, and facet and uncovertebral hypertrophy. These changes are greatest from C4-C5 through C6-C7. Upper chest: No apical lung mass. Other: Calcified plaque at the common carotid bifurcations. IMPRESSION: No acute cervical spine fracture. Electronically Signed   By: Guadlupe Spanish M.D.   On: 01/14/2019 16:21   DG Chest Portable 1 View  Result Date: 02/03/2019 CLINICAL DATA:  Weakness EXAM: PORTABLE CHEST 1 VIEW COMPARISON:  None. FINDINGS: The heart size and mediastinal contours are within normal limits. Both lungs are clear. The visualized skeletal structures are unremarkable. IMPRESSION: No active disease. Electronically Signed   By: Deatra Robinson M.D.   On: 02/03/2019 21:17   DG Knee Complete 4 Views Right  Result Date: 01/14/2019 CLINICAL DATA:  Pain status post fall. EXAM: RIGHT KNEE - COMPLETE 4+ VIEW COMPARISON:  None. FINDINGS: The patient is status post prior below-knee amputation. There is no acute displaced fracture or dislocation. There is a small joint effusion. Mild tricompartmental degenerative changes are noted, greatest within the patellofemoral compartment. Vascular calcifications are noted. IMPRESSION: 1. No acute  abnormality. 2. Status post below-knee amputation. 3. Mild tricompartmental degenerative changes. Small joint effusion. Electronically Signed   By: Katherine Mantle M.D.   On: 01/14/2019 17:35   CT Maxillofacial Wo Contrast  Result Date: 01/14/2019 CLINICAL DATA:  Facial trauma status post fall EXAM: CT HEAD WITHOUT CONTRAST CT MAXILLOFACIAL WITHOUT CONTRAST TECHNIQUE: Multidetector CT imaging of the head and maxillofacial structures were performed using the standard protocol without intravenous contrast. Multiplanar CT image reconstructions of the maxillofacial structures were also generated. COMPARISON:  06/30/2017 FINDINGS: CT HEAD FINDINGS Brain: No evidence of acute infarction, hemorrhage, extra-axial collection, ventriculomegaly, or mass effect. Generalized cerebral atrophy. Periventricular white matter low attenuation likely secondary to microangiopathy. Vascular: Cerebrovascular atherosclerotic calcifications are noted. Skull: Negative for fracture or focal lesion. Sinuses/Orbits: Visualized portions of the orbits are unremarkable. Visualized portions of the paranasal sinuses are unremarkable. Visualized portions of the mastoid air cells are unremarkable. Other: None. CT MAXILLOFACIAL FINDINGS Osseous: No fracture or mandibular dislocation. No destructive process. Degenerative disease with disc height loss at C5-6. Moderate right facet arthropathy at C2-3 and C3-4. Orbits: Negative. No traumatic or inflammatory finding. Sinuses: Right maxillary sinus mucosal thickening. No paranasal sinus air-fluid level. Soft tissues: Negative. IMPRESSION: 1. No acute intracranial pathology. 2.  No acute osseous injury of the maxillofacial bones. Electronically Signed   By: Elige Ko   On: 01/14/2019 16:36  Labs: BNP (last 3 results) No results for input(s): BNP in the last 8760 hours. Basic Metabolic Panel: Recent Labs  Lab 02/03/19 2035 02/04/19 0040 02/05/19 0425  NA 141 143 141  K 3.8 3.9  3.5  CL 108 107 105  CO2 21* 27 25  GLUCOSE 76 81 76  BUN 17 18 21   CREATININE 0.93 1.01 0.87  CALCIUM 9.2 9.2 9.2  MG 2.3  --  2.0   Liver Function Tests: Recent Labs  Lab 02/03/19 2035  AST 42*  ALT 29  ALKPHOS 82  BILITOT 0.9  PROT 8.1  ALBUMIN 3.4*   No results for input(s): LIPASE, AMYLASE in the last 168 hours. Recent Labs  Lab 02/03/19 2035  AMMONIA 33   CBC: Recent Labs  Lab 02/03/19 2035 02/04/19 0040 02/05/19 0425  WBC 7.1 9.4 11.2*  NEUTROABS 3.7  --   --   HGB 15.7 15.5 14.8  HCT 48.1 47.4 44.8  MCV 94.7 94.8 93.7  PLT 339 348 333   Cardiac Enzymes: No results for input(s): CKTOTAL, CKMB, CKMBINDEX, TROPONINI in the last 168 hours. BNP: Invalid input(s): POCBNP CBG: No results for input(s): GLUCAP in the last 168 hours. D-Dimer No results for input(s): DDIMER in the last 72 hours. Hgb A1c No results for input(s): HGBA1C in the last 72 hours. Lipid Profile No results for input(s): CHOL, HDL, LDLCALC, TRIG, CHOLHDL, LDLDIRECT in the last 72 hours. Thyroid function studies No results for input(s): TSH, T4TOTAL, T3FREE, THYROIDAB in the last 72 hours.  Invalid input(s): FREET3 Anemia work up No results for input(s): VITAMINB12, FOLATE, FERRITIN, TIBC, IRON, RETICCTPCT in the last 72 hours. Urinalysis    Component Value Date/Time   COLORURINE YELLOW (A) 06/30/2017 1641   APPEARANCEUR CLEAR (A) 06/30/2017 1641   LABSPEC 1.012 06/30/2017 1641   PHURINE 6.0 06/30/2017 1641   GLUCOSEU NEGATIVE 06/30/2017 1641   HGBUR NEGATIVE 06/30/2017 1641   BILIRUBINUR NEGATIVE 06/30/2017 1641   KETONESUR NEGATIVE 06/30/2017 1641   PROTEINUR NEGATIVE 06/30/2017 1641   NITRITE NEGATIVE 06/30/2017 1641   LEUKOCYTESUR NEGATIVE 06/30/2017 1641   Sepsis Labs Invalid input(s): PROCALCITONIN,  WBC,  LACTICIDVEN Microbiology Recent Results (from the past 240 hour(s))  SARS CORONAVIRUS 2 (TAT 6-24 HRS) Nasopharyngeal Nasopharyngeal Swab     Status: None    Collection Time: 02/03/19 10:41 PM   Specimen: Nasopharyngeal Swab  Result Value Ref Range Status   SARS Coronavirus 2 NEGATIVE NEGATIVE Final    Comment: (NOTE) SARS-CoV-2 target nucleic acids are NOT DETECTED. The SARS-CoV-2 RNA is generally detectable in upper and lower respiratory specimens during the acute phase of infection. Negative results do not preclude SARS-CoV-2 infection, do not rule out co-infections with other pathogens, and should not be used as the sole basis for treatment or other patient management decisions. Negative results must be combined with clinical observations, patient history, and epidemiological information. The expected result is Negative. Fact Sheet for Patients: HairSlick.nohttps://www.fda.gov/media/138098/download Fact Sheet for Healthcare Providers: quierodirigir.comhttps://www.fda.gov/media/138095/download This test is not yet approved or cleared by the [place] FDA and  has been authorized for detection and/or diagnosis of SARS-CoV-2 by FDA under an Emergency Use Authorization (EUA). This EUA will remain  in effect (meaning this test can be used) for the duration of the COVID-19 declaration under Section 56 4(b)(1) of the Act, 21 U.S.C. section 360bbb-3(b)(1), unless the authorization is terminated or revoked sooner. Performed at [facility], [address] [place], [place] [address]   MRSA PCR Screening  Status: None   Collection Time: 02/04/19  1:55 PM   Specimen: Nasal Mucosa; Nasopharyngeal  Result Value Ref Range Status   MRSA by PCR NEGATIVE NEGATIVE Final    Comment:        The GeneXpert MRSA Assay (FDA approved for NASAL specimens only), is one component of a comprehensive MRSA colonization surveillance program. It is not intended to diagnose MRSA infection nor to guide or monitor treatment for MRSA infections. Performed at [facility], [address] [place], [place] [address]      Total time spend on discharging this  patient, including the last patient exam, discussing the hospital stay, instructions for ongoing care as it relates to all pertinent caregivers, as well as preparing the medical discharge records, prescriptions, and/or referrals as applicable, is 30 minutes.    Darlin Priestly, MD  [facility] 02/05/2019, 11:52 AM  If 7PM-7AM, please contact night-coverage

## 2019-02-05 NOTE — NC FL2 (Signed)
[place] LEVEL OF CARE SCREENING TOOL     IDENTIFICATION  Patient Name: Kamareon Sciandra Birthdate: 09-Nov-1950 Sex: male Admission Date (Current Location): 02/03/2019  [facility] and [place] Number:  Engineering geologist and Address:  [facility], [address], [place], [place] [address]      Provider Number: [id number]  Attending Physician Name and Address:  Enzo Bi, MD  Relative Name and Phone Number:       Current Level of Care: Hospital Recommended Level of Care: [place] Prior Approval Number:    Date Approved/Denied:   PASRR Number:    Discharge Plan: Other (Comment)(ALF)    Current Diagnoses: Patient Active Problem List   Diagnosis Date Noted  . OSA (obstructive sleep apnea) 02/04/2019  . S/P bilateral BKA (below knee amputation) ([place]) 02/04/2019  . Depression 02/04/2019  . Anxiety 02/04/2019  . AMS (altered mental status) 02/03/2019  . Numbness and tingling in left hand 04/17/2016  . CVA (cerebral vascular accident) ([place]) 02/16/2016  . TIA (transient ischemic attack) 01/04/2016  . 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 Korsakoff syndrome ([place]) 12/16/2015    Orientation RESPIRATION BLADDER Height & Weight     Self  Normal External catheter, Incontinent(placed 1/20) Weight: 175 lb (79.4 kg) Height:  5\' 7"  (170.2 cm)  BEHAVIORAL SYMPTOMS/MOOD NEUROLOGICAL BOWEL NUTRITION STATUS      Continent Diet(regular diet, thin liquids)  AMBULATORY STATUS COMMUNICATION OF NEEDS Skin   Limited Assist Verbally Normal                       Personal Care Assistance Level of Assistance  Bathing, Feeding, Dressing Bathing Assistance: Limited assistance Feeding assistance: Limited assistance Dressing Assistance: Limited assistance     Functional Limitations Info  Sight, Hearing, Speech Sight Info: Adequate Hearing Info: Adequate Speech Info: Adequate    SPECIAL CARE  FACTORS FREQUENCY  PT (By licensed PT), OT (By licensed OT)                    Contractures Contractures Info: Not present    Additional Factors Info  Code Status, Allergies Code Status Info: Full Code Allergies Info: No known alleriges           Current Medications (02/05/2019):  This is the current hospital active medication list Current Facility-Administered Medications  Medication Dose Route Frequency Provider Last Rate Last Admin  . ARIPiprazole (ABILIFY) tablet 10 mg  10 mg Oral QPM Enzo Bi, MD   10 mg at 02/05/19 0202  . aspirin EC tablet 81 mg  81 mg Oral Daily Tu, Ching T, DO   81 mg at 02/04/19 0947  . barrier cream (non-specified) 1 application  1 application Topical PRN Enzo Bi, MD      . clopidogrel (PLAVIX) tablet 75 mg  75 mg Oral Daily Enzo Bi, MD      . DULoxetine (CYMBALTA) DR capsule 60 mg  60 mg Oral Daily Enzo Bi, MD      . enoxaparin (LOVENOX) injection 40 mg  40 mg Subcutaneous Q24H Tu, Ching T, DO   40 mg at 02/04/19 2225  . Melatonin TABS 5 mg  5 mg Oral QHS Enzo Bi, MD   5 mg at 02/05/19 0158  . mirtazapine (REMERON) tablet 15 mg  15 mg Oral QHS Enzo Bi, MD   15 mg at 02/05/19 0158  . thiamine tablet 100 mg  100 mg  Oral Daily Darlin Priestly, MD   100 mg at 02/05/19 0158     Discharge Medications: Please see discharge summary for a list of discharge medications.  Relevant Imaging Results:  Relevant Lab Results:   Additional Information KOR:[id number]  Maree Krabbe, LCSW

## 2019-02-05 NOTE — Progress Notes (Addendum)
Tried to call report to [facility] ([phone].     Did not ring through and the voice message systems is not set up.    Sent AVS information with patient/ facility transport.

## 2023-07-24 ENCOUNTER — Other Ambulatory Visit: Payer: Self-pay | Admitting: Nurse Practitioner

## 2023-07-24 DIAGNOSIS — R1310 Dysphagia, unspecified: Secondary | ICD-10-CM

## 2023-07-24 DIAGNOSIS — R051 Acute cough: Secondary | ICD-10-CM

## 2023-07-26 ENCOUNTER — Ambulatory Visit

## 2023-08-01 ENCOUNTER — Ambulatory Visit
Admission: RE | Admit: 2023-08-01 | Discharge: 2023-08-01 | Disposition: A | Discharge status: Home / Self Care | Source: Ambulatory Visit | Attending: Nurse Practitioner | Admitting: Nurse Practitioner

## 2023-08-01 DIAGNOSIS — R051 Acute cough: Secondary | ICD-10-CM | POA: Insufficient documentation

## 2023-08-01 DIAGNOSIS — R1310 Dysphagia, unspecified: Secondary | ICD-10-CM | POA: Insufficient documentation

## 2023-08-01 NOTE — Therapy (Signed)
 Modified Barium Swallow Study  Patient Details  Name: Maxwell Webb MRN: [id number] Date of Birth: 01/10/1951  Today's Date: 08/01/2023  Modified Barium Swallow completed.  Full report located under Chart Review in the Imaging Section.  History of Present Illness 73 y.o. male who presents for MBSS from SNF for acute cough. Pt with hx of B amputations with B prostheses (not present), Wernicke's Encephalopathy, HLD, HTN, sleep apnea, and stroke.  Head CT. 02/03/19, 1. No evidence of acute intracranial process.  2. Moderate atrophy and chronic microvascular ischemic changes of  the white matter.   Clinical Impression Pt presents with a moderate oropharyngeal dysphagia. Pt demonstrated oral holding and prolonged oral prep with both solids and purees, inconsistently with liquids. Incomplete mastication of solids. Repetition lingual motion also noted throughout study. Piecemeal swallow and/or oral residual noted. Pharyngeally, pt with mild-moderate vallecular stasis with solids in which liquid wash via straw attributed to before the swallow aspiration. Residual was trace-unremarkable with other consistencies. Inconsistently delayed swallow initiation noted. Above mentioned deficits due to lingual weakness/incoordination, reduced BOT retraction, and mistimed swallow initiation. Deficits likely exacerbated by cognitive status. Recommend a pureed diet (runnier purees preferred with additional gravies, sauces, and condiments to moisten) with thin liquids Recommend safe swallowing strategies/aspiration including, but not limited to, 1:1 assistance with feeding, avoidance of straws, watch for pharyngeal swallow and clearance of oral cavity before next bite/sip, and upright as possible for PO intake. Factors that may increase risk of adverse event in presence of aspiration Noe & Lianne 2021): Limited mobility;Frail or deconditioned;Inadequate oral hygiene;Weak cough;Aspiration of thick, dense, and/or acidic  materials  Swallow Evaluation Recommendations Recommendations: PO diet PO Diet Recommendation: Dysphagia 1 (Pureed);Full liquid diet (runny purees with extra gravies, sauces, condiments) Liquid Administration via: Spoon;Cup;No straw Medication Administration: Crushed with puree Supervision: Full assist for feeding;Full supervision/cueing for swallowing strategies Swallowing strategies  : Slow rate;Small bites/sips;Check for pocketing or oral holding Postural changes: Position pt fully upright for meals;Stay upright 30-60 min after meals;Out of bed for meals Oral care recommendations: Oral care QID (4x/day);Staff/trained caregiver to provide oral care    Maxwell Webb, M.S., CCC-SLP Speech-Language Pathologist [facility] [phone] (ASCOM)   Maxwell Webb 08/01/2023,1:43 PM

## 2023-08-05 ENCOUNTER — Inpatient Hospital Stay
Admission: EM | Admit: 2023-08-05 | Discharge: 2023-08-13 | DRG: 871 | Disposition: A | Discharge status: Skilled Nursing Facility | Source: Skilled Nursing Facility | Attending: Internal Medicine | Admitting: Internal Medicine

## 2023-08-05 ENCOUNTER — Emergency Department

## 2023-08-05 ENCOUNTER — Other Ambulatory Visit: Payer: Self-pay

## 2023-08-05 DIAGNOSIS — J159 Unspecified bacterial pneumonia: Secondary | ICD-10-CM | POA: Diagnosis present

## 2023-08-05 DIAGNOSIS — F418 Other specified anxiety disorders: Secondary | ICD-10-CM | POA: Diagnosis not present

## 2023-08-05 DIAGNOSIS — I10 Essential (primary) hypertension: Secondary | ICD-10-CM | POA: Diagnosis present

## 2023-08-05 DIAGNOSIS — F32A Depression, unspecified: Secondary | ICD-10-CM | POA: Diagnosis present

## 2023-08-05 DIAGNOSIS — R000 Tachycardia, unspecified: Secondary | ICD-10-CM | POA: Diagnosis present

## 2023-08-05 DIAGNOSIS — Y95 Nosocomial condition: Secondary | ICD-10-CM | POA: Diagnosis present

## 2023-08-05 DIAGNOSIS — E785 Hyperlipidemia, unspecified: Secondary | ICD-10-CM | POA: Diagnosis present

## 2023-08-05 DIAGNOSIS — F17210 Nicotine dependence, cigarettes, uncomplicated: Secondary | ICD-10-CM | POA: Diagnosis present

## 2023-08-05 DIAGNOSIS — L89322 Pressure ulcer of left buttock, stage 2: Secondary | ICD-10-CM | POA: Diagnosis present

## 2023-08-05 DIAGNOSIS — A419 Sepsis, unspecified organism: Secondary | ICD-10-CM | POA: Diagnosis not present

## 2023-08-05 DIAGNOSIS — Z7401 Bed confinement status: Secondary | ICD-10-CM

## 2023-08-05 DIAGNOSIS — Z66 Do not resuscitate: Secondary | ICD-10-CM | POA: Diagnosis present

## 2023-08-05 DIAGNOSIS — N179 Acute kidney failure, unspecified: Secondary | ICD-10-CM | POA: Diagnosis present

## 2023-08-05 DIAGNOSIS — E860 Dehydration: Secondary | ICD-10-CM | POA: Diagnosis present

## 2023-08-05 DIAGNOSIS — F419 Anxiety disorder, unspecified: Secondary | ICD-10-CM | POA: Diagnosis present

## 2023-08-05 DIAGNOSIS — R5383 Other fatigue: Secondary | ICD-10-CM

## 2023-08-05 DIAGNOSIS — E1151 Type 2 diabetes mellitus with diabetic peripheral angiopathy without gangrene: Secondary | ICD-10-CM | POA: Diagnosis present

## 2023-08-05 DIAGNOSIS — Z79890 Hormone replacement therapy (postmenopausal): Secondary | ICD-10-CM

## 2023-08-05 DIAGNOSIS — J189 Pneumonia, unspecified organism: Secondary | ICD-10-CM | POA: Diagnosis present

## 2023-08-05 DIAGNOSIS — Z7902 Long term (current) use of antithrombotics/antiplatelets: Secondary | ICD-10-CM

## 2023-08-05 DIAGNOSIS — I110 Hypertensive heart disease with heart failure: Secondary | ICD-10-CM | POA: Diagnosis present

## 2023-08-05 DIAGNOSIS — R627 Adult failure to thrive: Secondary | ICD-10-CM | POA: Diagnosis present

## 2023-08-05 DIAGNOSIS — Z79899 Other long term (current) drug therapy: Secondary | ICD-10-CM

## 2023-08-05 DIAGNOSIS — Z7984 Long term (current) use of oral hypoglycemic drugs: Secondary | ICD-10-CM

## 2023-08-05 DIAGNOSIS — Z7189 Other specified counseling: Secondary | ICD-10-CM | POA: Diagnosis not present

## 2023-08-05 DIAGNOSIS — I639 Cerebral infarction, unspecified: Secondary | ICD-10-CM | POA: Diagnosis present

## 2023-08-05 DIAGNOSIS — Z833 Family history of diabetes mellitus: Secondary | ICD-10-CM

## 2023-08-05 DIAGNOSIS — Z789 Other specified health status: Secondary | ICD-10-CM | POA: Diagnosis not present

## 2023-08-05 DIAGNOSIS — Z89512 Acquired absence of left leg below knee: Secondary | ICD-10-CM | POA: Diagnosis not present

## 2023-08-05 DIAGNOSIS — R6520 Severe sepsis without septic shock: Secondary | ICD-10-CM | POA: Diagnosis present

## 2023-08-05 DIAGNOSIS — E872 Acidosis, unspecified: Secondary | ICD-10-CM | POA: Diagnosis present

## 2023-08-05 DIAGNOSIS — E870 Hyperosmolality and hypernatremia: Secondary | ICD-10-CM | POA: Diagnosis present

## 2023-08-05 DIAGNOSIS — R1310 Dysphagia, unspecified: Secondary | ICD-10-CM | POA: Diagnosis present

## 2023-08-05 DIAGNOSIS — G9341 Metabolic encephalopathy: Secondary | ICD-10-CM | POA: Diagnosis present

## 2023-08-05 DIAGNOSIS — Z1152 Encounter for screening for COVID-19: Secondary | ICD-10-CM | POA: Diagnosis not present

## 2023-08-05 DIAGNOSIS — I5032 Chronic diastolic (congestive) heart failure: Secondary | ICD-10-CM | POA: Diagnosis present

## 2023-08-05 DIAGNOSIS — Z515 Encounter for palliative care: Secondary | ICD-10-CM | POA: Diagnosis not present

## 2023-08-05 DIAGNOSIS — E119 Type 2 diabetes mellitus without complications: Secondary | ICD-10-CM | POA: Diagnosis not present

## 2023-08-05 DIAGNOSIS — Z8673 Personal history of transient ischemic attack (TIA), and cerebral infarction without residual deficits: Secondary | ICD-10-CM

## 2023-08-05 DIAGNOSIS — Z89511 Acquired absence of right leg below knee: Secondary | ICD-10-CM | POA: Diagnosis not present

## 2023-08-05 DIAGNOSIS — Z8249 Family history of ischemic heart disease and other diseases of the circulatory system: Secondary | ICD-10-CM

## 2023-08-05 LAB — COMPREHENSIVE METABOLIC PANEL WITH GFR
ALT: 9 U/L (ref 0–44)
AST: 27 U/L (ref 15–41)
Albumin: 3.1 g/dL — ABNORMAL LOW (ref 3.5–5.0)
Alkaline Phosphatase: 68 U/L (ref 38–126)
Anion gap: 18 — ABNORMAL HIGH (ref 5–15)
BUN: 33 mg/dL — ABNORMAL HIGH (ref 8–23)
CO2: 22 mmol/L (ref 22–32)
Calcium: 9.6 mg/dL (ref 8.9–10.3)
Chloride: 108 mmol/L (ref 98–111)
Creatinine, Ser: 1.6 mg/dL — ABNORMAL HIGH (ref 0.61–1.24)
GFR, Estimated: 46 mL/min — ABNORMAL LOW (ref >60)
Glucose, Bld: 158 mg/dL — ABNORMAL HIGH (ref 70–99)
Potassium: 3.9 mmol/L (ref 3.5–5.1)
Sodium: 148 mmol/L — ABNORMAL HIGH (ref 135–145)
Total Bilirubin: 0.5 mg/dL (ref 0.0–1.2)
Total Protein: 8.3 g/dL — ABNORMAL HIGH (ref 6.5–8.1)

## 2023-08-05 LAB — LACTIC ACID, PLASMA
Lactic Acid, Venous: 5.2 mmol/L (ref 0.5–1.9)
Lactic Acid, Venous: 3.2 mmol/L (ref 0.5–1.9)
Lactic Acid, Venous: 2.3 mmol/L (ref 0.5–1.9)

## 2023-08-05 LAB — RESP PANEL BY RT-PCR (RSV, FLU A&B, COVID)  RVPGX2
Influenza A by PCR: NEGATIVE
Influenza B by PCR: NEGATIVE
Resp Syncytial Virus by PCR: NEGATIVE
SARS Coronavirus 2 by RT PCR: NEGATIVE

## 2023-08-05 LAB — CBC WITH DIFFERENTIAL/PLATELET
Abs Immature Granulocytes: 0.11 K/uL — ABNORMAL HIGH (ref 0.00–0.07)
Basophils Absolute: 0.1 K/uL (ref 0.0–0.1)
Basophils Relative: 0 %
Eosinophils Absolute: 0.1 K/uL (ref 0.0–0.5)
Eosinophils Relative: 1 %
HCT: 53.6 % — ABNORMAL HIGH (ref 39.0–52.0)
Hemoglobin: 17.4 g/dL — ABNORMAL HIGH (ref 13.0–17.0)
Immature Granulocytes: 1 %
Lymphocytes Relative: 6 %
Lymphs Abs: 1.2 K/uL (ref 0.7–4.0)
MCH: 31.9 pg (ref 26.0–34.0)
MCHC: 32.5 g/dL (ref 30.0–36.0)
MCV: 98.3 fL (ref 80.0–100.0)
Monocytes Absolute: 1.8 K/uL — ABNORMAL HIGH (ref 0.1–1.0)
Monocytes Relative: 9 %
Neutro Abs: 17.5 K/uL — ABNORMAL HIGH (ref 1.7–7.7)
Neutrophils Relative %: 83 %
Platelets: 269 K/uL (ref 150–400)
RBC: 5.45 MIL/uL (ref 4.22–5.81)
RDW: 14 % (ref 11.5–15.5)
WBC: 20.7 K/uL — ABNORMAL HIGH (ref 4.0–10.5)
nRBC: 0 % (ref 0.0–0.2)

## 2023-08-05 LAB — PROCALCITONIN: Procalcitonin: 0.32 ng/mL

## 2023-08-05 LAB — HEMOGLOBIN A1C
Hgb A1c MFr Bld: 5.1 % (ref 4.8–5.6)
Mean Plasma Glucose: 99.67 mg/dL

## 2023-08-05 LAB — CBG MONITORING, ED
Glucose-Capillary: 154 mg/dL — ABNORMAL HIGH (ref 70–99)
Glucose-Capillary: 107 mg/dL — ABNORMAL HIGH (ref 70–99)

## 2023-08-05 LAB — PROTIME-INR
INR: 1.2 (ref 0.8–1.2)
Prothrombin Time: 15.5 s — ABNORMAL HIGH (ref 11.4–15.2)

## 2023-08-05 LAB — BRAIN NATRIURETIC PEPTIDE: B Natriuretic Peptide: 382 pg/mL — ABNORMAL HIGH (ref 0.0–100.0)

## 2023-08-05 MED ORDER — FLUOXETINE HCL 20 MG PO CAPS
40.0000 mg | ORAL_CAPSULE | Freq: Every day | ORAL | Status: DC
  Administered 2023-08-09 – 2023-08-12 (×3): 40 mg via ORAL
  Filled 2023-08-05 (×5): qty 2

## 2023-08-05 MED ORDER — SODIUM CHLORIDE 0.9 % IV SOLN: INTRAVENOUS | Status: DC

## 2023-08-05 MED ORDER — DM-GUAIFENESIN ER 30-600 MG PO TB12: 1.0000 | ORAL_TABLET | Freq: Two times a day (BID) | ORAL | Status: DC | PRN

## 2023-08-05 MED ORDER — CLOPIDOGREL BISULFATE 75 MG PO TABS
75.0000 mg | ORAL_TABLET | Freq: Every day | ORAL | Status: DC
  Administered 2023-08-09 – 2023-08-13 (×4): 75 mg via ORAL
  Filled 2023-08-05 (×4): qty 1

## 2023-08-05 MED ORDER — SODIUM CHLORIDE 0.9 % IV BOLUS
500.0000 mL | Freq: Once | INTRAVENOUS | Status: AC
  Administered 2023-08-05: 500 mL via INTRAVENOUS

## 2023-08-05 MED ORDER — NICOTINE 21 MG/24HR TD PT24
21.0000 mg | MEDICATED_PATCH | Freq: Every day | TRANSDERMAL | Status: DC
  Administered 2023-08-06 – 2023-08-13 (×8): 21 mg via TRANSDERMAL
  Filled 2023-08-05 (×8): qty 1

## 2023-08-05 MED ORDER — ALBUTEROL SULFATE (2.5 MG/3ML) 0.083% IN NEBU: 2.5000 mg | INHALATION_SOLUTION | RESPIRATORY_TRACT | Status: DC | PRN

## 2023-08-05 MED ORDER — OLOPATADINE HCL 0.1 % OP SOLN
1.0000 [drp] | Freq: Every day | OPHTHALMIC | Status: DC
  Administered 2023-08-06 – 2023-08-13 (×8): 1 [drp] via OPHTHALMIC
  Filled 2023-08-05: qty 5

## 2023-08-05 MED ORDER — MELATONIN 5 MG PO TABS
5.0000 mg | ORAL_TABLET | Freq: Every day | ORAL | Status: DC
  Administered 2023-08-08 – 2023-08-12 (×5): 5 mg via ORAL
  Filled 2023-08-05 (×6): qty 1

## 2023-08-05 MED ORDER — DIVALPROEX SODIUM 125 MG PO CSDR
250.0000 mg | DELAYED_RELEASE_CAPSULE | Freq: Two times a day (BID) | ORAL | Status: DC
  Administered 2023-08-08 – 2023-08-12 (×9): 250 mg via ORAL
  Filled 2023-08-05 (×18): qty 2

## 2023-08-05 MED ORDER — VANCOMYCIN HCL 2000 MG/400ML IV SOLN
2000.0000 mg | Freq: Once | INTRAVENOUS | Status: AC
  Administered 2023-08-05: 2000 mg via INTRAVENOUS
  Filled 2023-08-05: qty 400

## 2023-08-05 MED ORDER — METOPROLOL SUCCINATE ER 25 MG PO TB24: 25.0000 mg | ORAL_TABLET | Freq: Every day | ORAL | Status: DC

## 2023-08-05 MED ORDER — INSULIN ASPART 100 UNIT/ML IJ SOLN: 0.0000 [IU] | Freq: Every day | INTRAMUSCULAR | Status: DC

## 2023-08-05 MED ORDER — INSULIN ASPART 100 UNIT/ML IJ SOLN: 0.0000 [IU] | Freq: Three times a day (TID) | INTRAMUSCULAR | Status: DC

## 2023-08-05 MED ORDER — SODIUM CHLORIDE 0.45 % IV SOLN: INTRAVENOUS | Status: DC

## 2023-08-05 MED ORDER — SODIUM CHLORIDE 0.9 % IV SOLN
2.0000 g | Freq: Once | INTRAVENOUS | Status: AC
  Administered 2023-08-05: 2 g via INTRAVENOUS
  Filled 2023-08-05: qty 12.5

## 2023-08-05 MED ORDER — ONDANSETRON HCL 4 MG/2ML IJ SOLN: 4.0000 mg | Freq: Three times a day (TID) | INTRAMUSCULAR | Status: DC | PRN

## 2023-08-05 MED ORDER — ACETAMINOPHEN 650 MG RE SUPP: 650.0000 mg | Freq: Four times a day (QID) | RECTAL | Status: DC | PRN

## 2023-08-05 MED ORDER — ATORVASTATIN CALCIUM 10 MG PO TABS
10.0000 mg | ORAL_TABLET | Freq: Every day | ORAL | Status: DC
  Administered 2023-08-08 – 2023-08-12 (×5): 10 mg via ORAL
  Filled 2023-08-05 (×6): qty 1

## 2023-08-05 MED ORDER — SODIUM CHLORIDE 0.9 % IV BOLUS
2000.0000 mL | Freq: Once | INTRAVENOUS | Status: AC
  Administered 2023-08-05: 2000 mL via INTRAVENOUS

## 2023-08-05 MED ORDER — HYDRALAZINE HCL 20 MG/ML IJ SOLN: 5.0000 mg | INTRAMUSCULAR | Status: DC | PRN

## 2023-08-05 MED ORDER — VITAMIN D 25 MCG (1000 UNIT) PO TABS
4000.0000 [IU] | ORAL_TABLET | Freq: Every day | ORAL | Status: DC
  Administered 2023-08-09 – 2023-08-13 (×4): 4000 [IU] via ORAL
  Filled 2023-08-05 (×4): qty 4

## 2023-08-05 MED ORDER — ENOXAPARIN SODIUM 40 MG/0.4ML IJ SOSY
40.0000 mg | PREFILLED_SYRINGE | INTRAMUSCULAR | Status: DC
  Administered 2023-08-05 – 2023-08-12 (×8): 40 mg via SUBCUTANEOUS
  Filled 2023-08-05 (×8): qty 0.4

## 2023-08-05 MED ORDER — THIAMINE MONONITRATE 100 MG PO TABS
100.0000 mg | ORAL_TABLET | Freq: Every day | ORAL | Status: DC
Start: 2023-08-06 — End: 2023-08-13
  Administered 2023-08-09 – 2023-08-13 (×4): 100 mg via ORAL
  Filled 2023-08-05 (×4): qty 1

## 2023-08-05 NOTE — ED Triage Notes (Signed)
 Pt arrived via [facility] from [facility]. Per EMS they were called due to pt having a high HR however pt is altered and responsive to painful stimuli and is mute at baseline.

## 2023-08-05 NOTE — H&P (Signed)
 History and Physical    Maxwell Webb FMW:[id number] DOB: 05/20/1950 DOA: 08/05/2023  Referring MD/NP/PA:   PCP: [facility]   Patient coming from:  The patient is coming from SNF    Chief Complaint: Tachycardia  HPI: Maxwell Webb is a 73 y.o. male with medical history significant of alcohol abuse, Warnicke Korsakoff syndrome, stroke, nonverbal at the baseline, HTN, HLD, DM, PVD, dCHF, depression with anxiety, s/p of bilateral BKA, who presents with tachycardia.  Patient is nonverbal at baseline and is unable to provide medical history, therefore, most of the history is obtained by discussing the case with ED physician, per EMS report, and with the nursing staff.  Per report, pt was noted to be tachycardic and less interactive in facility.  His heart rate was initially 142, which improved to 110s after giving IV fluid in ED.  Per ED physician, his mental status has improved and back to baseline after giving IV fluid.  When I saw patient in ED, patient very slowly and minimally waves his hand, indicating that he probably understands our conversation.  Patient has tachypnea with RR 38, no oxygen desaturation, 2 L oxygen was started in ED for comfort reason.  No active nausea, vomiting, diarrhea noted.  No active respiratory distress, cough noted.  Does not seem to have chest pain or abdominal pain.  Not sure if patient has symptoms of UTI.   Data reviewed independently and ED Course: pt was found to have WBC 20.7, AKI, lactic acid of 5.2, procalcitonin 0.32, INR 1.2, sodium 148,  pending UA. Temperature 98.6, blood pressure 129/85, heart rate 142 --> 110s, RR 38, oxygen sat 98% on 2 L oxygen.  Chest x-ray showed minimal left basilar opacity.  Patient is admitted to PCU as inpatient.  CXR: Minimal peripheral opacity at the left lung base, atelectasis versus pneumonia. Pending UA:    EKG: I have personally reviewed.  Sinus rhythm, QTc 499, heart rate of 139, right bundle blockade,  low voltage, early outpatient.  Review of Systems: Not able to be reviewed due to nonverbal status.    Allergy: No Known Allergies  Past Medical History:  Diagnosis Date   Anxiety    Depression    Hyperlipemia    Hypertension    Korsakoff disease (HCC)    Nicotine  abuse    Osteomyelitis (HCC)    PVD (peripheral vascular disease) (HCC)    Stroke (HCC)    Wernicke encephalopathy     Past Surgical History:  Procedure Laterality Date   BELOW KNEE LEG AMPUTATION Bilateral    FINGER SURGERY     left index finger - as a child    Social History:  reports that he has been smoking cigarettes. He has a 20 pack-year smoking history. He has never used smokeless tobacco. He reports that he does not drink alcohol and does not use drugs.  Family History:  Family History  Problem Relation Age of Onset   Hypertension Mother    Other Mother        brain bleed from accidental fall on ice - age 61   Diabetes Father      Prior to Admission medications   Medication Sig Start Date End Date Taking? Authorizing Provider  acetaminophen  (TYLENOL ) 325 MG tablet Take 650 mg by mouth every 4 (four) hours as needed for mild pain, moderate pain or headache.   Yes [provider]  atorvastatin  (LIPITOR) 10 MG tablet Take 10 mg by mouth daily. 07/17/23  Yes [provider]  Cholecalciferol (VITAMIN D ) 50 MCG (2000 UT) tablet Take 4,000 Units by mouth daily.   Yes [provider]  clopidogrel  (PLAVIX ) 75 MG tablet Take 1 tablet (75 mg total) by mouth daily. 02/18/16  Yes Gouru, Aruna, MD  divalproex  (DEPAKOTE  SPRINKLE) 125 MG capsule Take 250 mg by mouth every 12 (twelve) hours. 07/09/23  Yes [provider]  divalproex  (DEPAKOTE  SPRINKLE) 125 MG capsule Take 125 mg by mouth daily.   Yes [provider]  FLUoxetine  (PROZAC ) 40 MG capsule Take 40 mg by mouth daily. 07/18/23  Yes [provider]  medroxyPROGESTERone (PROVERA) 10 MG tablet Take 10 mg by mouth  daily. 07/25/23  Yes [provider]  melatonin 5 MG TABS Take 3 mg by mouth at bedtime.   Yes [provider]  metFORMIN (GLUCOPHAGE) 1000 MG tablet Take 1,000 mg by mouth 2 (two) times daily. 07/25/23  Yes [provider]  metoprolol  succinate (TOPROL -XL) 25 MG 24 hr tablet Take 25 mg by mouth daily. 08/05/23  Yes [provider]  Olopatadine  HCl (PATADAY ) 0.7 % SOLN Apply 1 drop to eye daily.   Yes [provider]  risperiDONE microspheres (RISPERDAL CONSTA) 50 MG injection Inject into the muscle. 50MG /2ML, intramuscular, twice a week 07/18/23  Yes [provider]  sodium chloride  0.9 % infusion 21ml/hr, subcutaneous, every shift, 61ml/hr *1L via clysis; may d/c when completed 08/05/23  Yes [provider]  thiamine  (VITAMIN B-1) 100 MG tablet Take 100 mg by mouth daily.    Yes [provider]  alum & mag hydroxide-simeth (MINTOX) 200-200-20 MG/5ML suspension Take 30 mLs by mouth 4 (four) times daily as needed for indigestion or heartburn. Patient not taking: Reported on 08/05/2023    [provider]  ARIPiprazole  (ABILIFY ) 10 MG tablet Take 10 mg by mouth every evening.  Patient not taking: Reported on 08/05/2023    [provider]  aspirin  EC 81 MG EC tablet Take 1 tablet (81 mg total) by mouth daily. Patient not taking: Reported on 08/05/2023 02/18/16   Gouru, Aruna, MD  barrier cream (NON-SPECIFIED) CREA Apply 1 application topically as needed (skin protection). Patient not taking: Reported on 08/05/2023    [provider]  cefTRIAXone (ROCEPHIN) 1 g injection Inject 1 g into the muscle daily. 08/05/23   [provider]  diphenhydrAMINE (BENADRYL) 25 MG tablet Take 25 mg by mouth every 6 (six) hours as needed for allergies. Patient not taking: Reported on 08/05/2023    [provider]  DULoxetine  (CYMBALTA ) 60 MG capsule Take 60 mg by mouth daily.  Patient not taking: Reported on  08/05/2023    [provider]  folic acid  (FOLVITE ) 1 MG tablet Take 1 mg by mouth daily. Patient not taking: Reported on 08/05/2023    [provider]  guaiFENesin  (ROBITUSSIN) 100 MG/5ML SOLN Take 15 mLs by mouth every 6 (six) hours as needed for cough or to loosen phlegm. Patient not taking: Reported on 08/05/2023    [provider]  ibuprofen  (ADVIL ,MOTRIN ) 200 MG tablet Take 400 mg by mouth every 6 (six) hours as needed for mild pain. (max 2000mg  in 24 hours) Patient not taking: Reported on 08/05/2023    [provider]  loperamide (IMODIUM) 2 MG capsule Take 4 mg by mouth as needed for diarrhea or loose stools. (max 8 doses per 24 hours) Patient not taking: Reported on 08/05/2023    [provider]  magnesium hydroxide (MILK OF MAGNESIA) 400 MG/5ML  suspension Take 30 mLs by mouth daily as needed for mild constipation or moderate constipation. Patient not taking: Reported on 08/05/2023    [provider]  menthol-zinc oxide (GOLD BOND) powder Apply 1 application topically daily as needed. (powder or lotion) Patient not taking: Reported on 08/05/2023    [provider]  mirtazapine  (REMERON ) 15 MG tablet Take 15 mg by mouth at bedtime. Patient not taking: Reported on 08/05/2023    [provider]  Multiple Vitamins-Minerals [facility] COMPLETE 50 PLUS PO) Take 1 tablet by mouth daily. Patient not taking: Reported on 08/05/2023    [provider]  pyrithione zinc (HEAD AND SHOULDERS) 1 % shampoo Apply 1 application topically daily as needed for itching. (T-Gel shampoo also acceptable) Patient not taking: Reported on 08/05/2023    [provider]  Zinc 50 MG TABS Take 100 mg by mouth daily. Patient not taking: Reported on 08/05/2023    [provider]    Physical Exam: Vitals:   08/05/23 2200 08/05/23 2300 08/06/23 0030 08/06/23 0047  BP: 104/89 (!) 85/63 93/75   Pulse: 62 (!) 115 (!) 112   Resp: (!)  35 (!) 29 (!) 31   Temp:    (!) 97.5 F (36.4 C)  TempSrc:    Tympanic  SpO2: 98% 95% 96%   Weight:       General: Not in acute distress HEENT:       Eyes: PERRL, EOMI, no jaundice       ENT: No discharge from the ears and nose       Neck: No JVD, no bruit, no mass felt. Heme: No neck lymph node enlargement. Cardiac: S1/S2, RRR, No murmurs, No gallops or rubs. Respiratory: No rales, wheezing, rhonchi or rubs. GI: Soft, nondistended, nontender, no organomegaly, BS present. GU: No hematuria Ext: s/p of BKA Musculoskeletal: No joint deformities, No joint redness or warmth, no limitation of ROM in spin. Skin: No rashes.  Neuro: Nonverbal status, moves all extremities. Psych: Patient is not psychotic, no suicidal or hemocidal ideation.  Labs on Admission: I have personally reviewed following labs and imaging studies  CBC: Recent Labs  Lab 08/05/23 1645  WBC 20.7*  NEUTROABS 17.5*  HGB 17.4*  HCT 53.6*  MCV 98.3  PLT 269   Basic Metabolic Panel: Recent Labs  Lab 08/05/23 1645  NA 148*  K 3.9  CL 108  CO2 22  GLUCOSE 158*  BUN 33*  CREATININE 1.60*  CALCIUM  9.6   GFR: CrCl cannot be calculated (Unknown ideal weight.). Liver Function Tests: Recent Labs  Lab 08/05/23 1645  AST 27  ALT 9  ALKPHOS 68  BILITOT 0.5  PROT 8.3*  ALBUMIN 3.1*   No results for input(s): LIPASE, AMYLASE in the last 168 hours. No results for input(s): AMMONIA in the last 168 hours. Coagulation Profile: Recent Labs  Lab 08/05/23 1720  INR 1.2   Cardiac Enzymes: No results for input(s): CKTOTAL, CKMB, CKMBINDEX, TROPONINI in the last 168 hours. BNP (last 3 results) No results for input(s): PROBNP in the last 8760 hours. HbA1C: Recent Labs    08/05/23 2011  HGBA1C 5.1   CBG: Recent Labs  Lab 08/05/23 1622 08/05/23 2318  GLUCAP 154* 107*   Lipid Profile: No results for input(s): CHOL, HDL, LDLCALC, TRIG, CHOLHDL, LDLDIRECT in the last 72  hours. Thyroid Function Tests: No results for input(s): TSH, T4TOTAL, FREET4, T3FREE, THYROIDAB in the last 72 hours. Anemia Panel: No results for input(s): VITAMINB12, FOLATE, FERRITIN, TIBC,  IRON, RETICCTPCT in the last 72 hours. Urine analysis:    Component Value Date/Time   COLORURINE YELLOW (A) 06/30/2017 1641   APPEARANCEUR CLEAR (A) 06/30/2017 1641   LABSPEC 1.012 06/30/2017 1641   PHURINE 6.0 06/30/2017 1641   GLUCOSEU NEGATIVE 06/30/2017 1641   HGBUR NEGATIVE 06/30/2017 1641   BILIRUBINUR NEGATIVE 06/30/2017 1641   KETONESUR NEGATIVE 06/30/2017 1641   PROTEINUR NEGATIVE 06/30/2017 1641   NITRITE NEGATIVE 06/30/2017 1641   LEUKOCYTESUR NEGATIVE 06/30/2017 1641   Sepsis Labs: @LABRCNTIP (procalcitonin:4,lacticidven:4) ) Recent Results (from the past 240 hours)  Resp panel by RT-PCR (RSV, Flu A&B, Covid) Anterior Nasal Swab     Status: None   Collection Time: 08/05/23  8:11 PM   Specimen: Anterior Nasal Swab  Result Value Ref Range Status   SARS Coronavirus 2 by RT PCR NEGATIVE NEGATIVE Final    Comment: (NOTE) SARS-CoV-2 target nucleic acids are NOT DETECTED.  The SARS-CoV-2 RNA is generally detectable in upper respiratory specimens during the acute phase of infection. The lowest concentration of SARS-CoV-2 viral copies this assay can detect is 138 copies/mL. A negative result does not preclude SARS-Cov-2 infection and should not be used as the sole basis for treatment or other patient management decisions. A negative result may occur with  improper specimen collection/handling, submission of specimen other than nasopharyngeal swab, presence of viral mutation(s) within the areas targeted by this assay, and inadequate number of viral copies(<138 copies/mL). A negative result must be combined with clinical observations, patient history, and epidemiological information. The expected result is Negative.  Fact Sheet for Patients:   BloggerCourse.com  Fact Sheet for Healthcare Providers:  SeriousBroker.it  This test is no t yet approved or cleared by the [place]  FDA and  has been authorized for detection and/or diagnosis of SARS-CoV-2 by FDA under an Emergency Use Authorization (EUA). This EUA will remain  in effect (meaning this test can be used) for the duration of the COVID-19 declaration under Section 564(b)(1) of the Act, 21 U.S.C.section 360bbb-3(b)(1), unless the authorization is terminated  or revoked sooner.       Influenza A by PCR NEGATIVE NEGATIVE Final   Influenza B by PCR NEGATIVE NEGATIVE Final    Comment: (NOTE) The Xpert Xpress SARS-CoV-2/FLU/RSV plus assay is intended as an aid in the diagnosis of influenza from Nasopharyngeal swab specimens and should not be used as a sole basis for treatment. Nasal washings and aspirates are unacceptable for Xpert Xpress SARS-CoV-2/FLU/RSV testing.  Fact Sheet for Patients: BloggerCourse.com  Fact Sheet for Healthcare Providers: SeriousBroker.it  This test is not yet approved or cleared by the [place]  FDA and has been authorized for detection and/or diagnosis of SARS-CoV-2 by FDA under an Emergency Use Authorization (EUA). This EUA will remain in effect (meaning this test can be used) for the duration of the COVID-19 declaration under Section 564(b)(1) of the Act, 21 U.S.C. section 360bbb-3(b)(1), unless the authorization is terminated or revoked.     Resp Syncytial Virus by PCR NEGATIVE NEGATIVE Final    Comment: (NOTE) Fact Sheet for Patients: BloggerCourse.com  Fact Sheet for Healthcare Providers: SeriousBroker.it  This test is not yet approved or cleared by the [place]  FDA and has been authorized for detection and/or diagnosis of SARS-CoV-2 by FDA under an Emergency Use  Authorization (EUA). This EUA will remain in effect (meaning this test can be used) for the duration of the COVID-19 declaration under Section 564(b)(1) of the Act, 21 U.S.C. section 360bbb-3(b)(1), unless the authorization is terminated  or revoked.  Performed at [facility], [address], [place], [place] [address]      Radiological Exams on Admission:   Assessment/Plan Principal Problem:   Severe sepsis [facility]) Active Problems:   HCAP (healthcare-associated pneumonia)   Dehydration   Hypernatremia   AKI (acute kidney injury) (HCC)   Chronic diastolic CHF (congestive heart failure) (HCC)   HTN (hypertension)   HLD (hyperlipidemia)   Diabetes mellitus without complication (HCC)   Stroke (HCC)   Depression with anxiety   S/P bilateral BKA (below knee amputation) (HCC)   Assessment and Plan:   Severe sepsis due to possible HCAP (healthcare-associated pneumonia): Patient meets critical for severe sepsis with WBC 20.7, heart rate up to 140, RR 38, lactic acid 5.2, procalcitonin 0.32.  Chest x-ray showed left basilar opacity.  Patient may have HCAP versus aspiration pneumonia.  Pending UA.  - Will admit to PCU as inpt - IV Vancomycin  and Zosyn  (pt received 1 dose of cefepime  in ED) - Incentive spirometry - Mucinex  for cough  - Bronchodilators - Urine legionella and S. pneumococcal antigen - Follow up blood culture x2, sputum culture - Check RSEP panel --> negative - Trend lactic acid level per sepsis protocol - IVF: 2.5L of NS bolus in ED, followed by 1/2 NS at 75 cc/h - SLP  Dehydration and hypernatremia: Na 148 -IVF as above -continue 1/2 NS  AKI (acute kidney injury) (HCC): Baseline creatinine 0.87 02/05/2019.  His creatinine is 1.60, BUN 33, GFR 46, no recent baseline creatinine available. -Pending UA.  - IV fluid as above - Avoid using renal toxic medications.  Chronic diastolic CHF (congestive heart failure) (HCC): 2D echo on 01/04/2016 showed EF of  55 to 60% with grade 1 diastolic dysfunction.  BNP is elevated 382, but no leg edema or JVD.  Does not seem to have CHF exacerbation. -Hold diuretics due to severe sepsis  HTN (hypertension): -IV hydralazine  as needed - Hold metoprolol  due to severe sepsis and high risk of developing hypotension  HLD (hyperlipidemia) -Lipitor  Diabetes mellitus without complication (HCC): Recent A1c 6.2, well-controlled.  Patient is taking metformin -SSI  Stroke (HCC) -Lipitor  Depression with anxiety -Continue home medications  S/P bilateral BKA (below knee amputation) (HCC) - Fall precaution      DVT ppx:  SQ Lovenox   Code Status: Full code    Family Communication:     not done, no family member is at bed side.          Disposition Plan:  Anticipate discharge back to previous environment, SNF  Consults called:  none  Admission status and Level of care: Progressive:    for obs as inpt        Dispo: The patient is from: SNF              Anticipated d/c is to: SNF              Anticipated d/c date is: 2 days              Patient currently is not medically stable to d/c.    Severity of Illness:  The appropriate patient status for this patient is INPATIENT. Inpatient status is judged to be reasonable and necessary in order to provide the required intensity of service to ensure the patient's safety. The patient's presenting symptoms, physical exam findings, and initial radiographic and laboratory data in the context of their chronic comorbidities is felt to place them at high risk for further  clinical deterioration. Furthermore, it is not anticipated that the patient will be medically stable for discharge from the hospital within 2 midnights of admission.   * I certify that at the point of admission it is my clinical judgment that the patient will require inpatient hospital care spanning beyond 2 midnights from the point of admission due to high intensity of service, high risk for  further deterioration and high frequency of surveillance required.*       Date of Service 08/06/2023    [facility]   If 7PM-7AM, please contact night-coverage [email or web address] 08/06/2023, 2:38 AM

## 2023-08-05 NOTE — ED Provider Notes (Signed)
 [facility] Provider Note    Event Date/Time   First MD Initiated Contact with Patient 08/05/23 [phone]     (approximate)   History   Code Sepsis   HPI  Maxwell Webb is a 73 y.o. male who presents to the ED for evaluation of Code Sepsis   Review medical DC summary from 4 years ago.  No more recent history in our system.  Previous history of alcohol abuse with Warnicke Korsakoff syndrome, stroke, bilateral BKA.   Patient presents to the ED from a local SNF for evaluation of tachycardia and altered mentation.  He is nonverbal at baseline but apparently has been less responsive at his facility.  History is limited   Physical Exam   Triage Vital Signs: ED Triage Vitals  Encounter Vitals Group     BP --      Girls Systolic BP Percentile --      Girls Diastolic BP Percentile --      Boys Systolic BP Percentile --      Boys Diastolic BP Percentile --      Pulse Rate 08/05/23 1630 (!) 142     Resp 08/05/23 1630 (!) 38     Temp 08/05/23 1630 98.6 F (37 C)     Temp Source 08/05/23 1630 Axillary     SpO2 08/05/23 1630 97 %     Weight 08/05/23 1632 185 lb (83.9 kg)     Height --      Head Circumference --      Peak Flow --      Pain Score 08/05/23 1632 0     Pain Loc --      Pain Education --      Exclude from Growth Chart --     Most recent vital signs: Vitals:   08/05/23 1630 08/05/23 1700  BP:  129/85  Pulse: (!) 142 (!) 125  Resp: (!) 38   Temp: 98.6 F (37 C)   SpO2: 97% 98%    General: Somnolent, notably tachypneic CV:  Good peripheral perfusion.  Sinus tachycardia Resp:  Tachypnea Abd:  No distention.  MSK:  No deformity noted.  Bile BKA Neuro:  No focal deficits appreciated. Other:     ED Results / Procedures / Treatments   Labs (all labs ordered are listed, but only abnormal results are displayed) Labs Reviewed  LACTIC ACID, PLASMA - Abnormal; Notable for the following components:      Result Value   Lactic Acid, Venous  5.2 (*)    All other components within normal limits  CBC WITH DIFFERENTIAL/PLATELET - Abnormal; Notable for the following components:   WBC 20.7 (*)    Hemoglobin 17.4 (*)    HCT 53.6 (*)    Neutro Abs 17.5 (*)    Monocytes Absolute 1.8 (*)    Abs Immature Granulocytes 0.11 (*)    All other components within normal limits  COMPREHENSIVE METABOLIC PANEL WITH GFR - Abnormal; Notable for the following components:   Sodium 148 (*)    Glucose, Bld 158 (*)    BUN 33 (*)    Creatinine, Ser 1.60 (*)    Total Protein 8.3 (*)    Albumin 3.1 (*)    GFR, Estimated 46 (*)    Anion gap 18 (*)    All other components within normal limits  CBG MONITORING, ED - Abnormal; Notable for the following components:   Glucose-Capillary 154 (*)    All other components within normal limits  CULTURE, BLOOD (ROUTINE X 2)  CULTURE, BLOOD (ROUTINE X 2)  URINE CULTURE  PROCALCITONIN  LACTIC ACID, PLASMA  CBC WITH DIFFERENTIAL/PLATELET  PROTIME-INR  URINALYSIS, ROUTINE W REFLEX MICROSCOPIC    EKG Sinus tachycardia with rate 139 bpm.  Right bundle.  No STEMI  RADIOLOGY 1 view CXR interpreted by me with left basilar opacity  Official radiology report(s): DG Chest Port 1 View Result Date: 08/05/2023 CLINICAL DATA:  Provided history: Questionable sepsis - evaluate for abnormality Altered with tachycardia. EXAM: PORTABLE CHEST 1 VIEW COMPARISON:  Radiograph 02/03/2019 FINDINGS: Minimal peripheral opacity at the left lung base. Right lung is clear. Normal heart size with stable mediastinal contours. Aortic atherosclerosis. No pulmonary edema, large pleural effusion, or pneumothorax. IMPRESSION: Minimal peripheral opacity at the left lung base, atelectasis versus pneumonia. Electronically Signed   By: Andrea Gasman M.D.   On: 08/05/2023 16:54    PROCEDURES and INTERVENTIONS:  .1-3 Lead EKG Interpretation  Performed by: Claudene Rover, MD Authorized by: Claudene Rover, MD     Interpretation: abnormal      ECG rate:  140   ECG rate assessment: tachycardic     Rhythm: sinus tachycardia     Ectopy: none     Conduction: normal   .Critical Care  Performed by: Claudene Rover, MD Authorized by: Claudene Rover, MD   Critical care provider statement:    Critical care time (minutes):  30   Critical care time was exclusive of:  Separately billable procedures and treating other patients   Critical care was necessary to treat or prevent imminent or life-threatening deterioration of the following conditions:  Sepsis   Critical care was time spent personally by me on the following activities:  Development of treatment plan with patient or surrogate, discussions with consultants, evaluation of patient's response to treatment, examination of patient, ordering and review of laboratory studies, ordering and review of radiographic studies, ordering and performing treatments and interventions, pulse oximetry, re-evaluation of patient's condition and review of old charts .Ultrasound ED Peripheral IV (Provider)  Date/Time: 08/05/2023 4:40 PM  Performed by: Claudene Rover, MD Authorized by: Claudene Rover, MD   Procedure details:    Indications: multiple failed IV attempts and poor IV access     Skin Prep: chlorhexidine gluconate     Location: right basilic v.   Angiocath:  20 G   Bedside Ultrasound Guided: Yes     Images: not archived     Patient tolerated procedure without complications: Yes     Dressing applied: Yes     Medications  vancomycin  (VANCOREADY) IVPB 2000 mg/400 mL (has no administration in time range)  sodium chloride  0.9 % bolus 2,000 mL (2,000 mLs Intravenous New Bag/Given 08/05/23 1645)  ceFEPIme  (MAXIPIME ) 2 g in sodium chloride  0.9 % 100 mL IVPB (0 g Intravenous Stopped 08/05/23 1751)     IMPRESSION / MDM / ASSESSMENT AND PLAN / ED COURSE  I reviewed the triage vital signs and the nursing notes.  Differential diagnosis includes, but is not limited to, pneumonia, bacteremia, UTI, dehydration,  AKI  {Patient presents with symptoms of an acute illness or injury that is potentially life-threatening.  Patient presents from local facility with signs of severe sepsis from pneumonia, AKI requiring medical admission.  No signs of shock or instability.  Leukocytosis and elevated lactic acid and procalcitonin.  AKI.  Infiltrate on x-ray and he is quite tachypneic and suspected pneumonia.  Provided broad-spectrum antibiotics.  Consult medicine for admission.  Clinical Course as of 08/05/23  [id number]  Mon Aug 05, 2023  1632 USIV on arrival. Right basilic v, 20g  [DS]  1812 Reassessed, visually tracking more consistently and looks better than his presentation. [DS]    Clinical Course User Index [DS] Claudene Rover, MD     FINAL CLINICAL IMPRESSION(S) / ED DIAGNOSES   Final diagnoses:  Sepsis due to pneumonia [facility])  AKI (acute kidney injury) (HCC)     Rx / DC Orders   ED Discharge Orders     None        Note:  This document was prepared using Dragon voice recognition software and may include unintentional dictation errors.   Claudene Rover, MD 08/05/23 [phone]

## 2023-08-06 DIAGNOSIS — N179 Acute kidney failure, unspecified: Secondary | ICD-10-CM | POA: Diagnosis not present

## 2023-08-06 DIAGNOSIS — A419 Sepsis, unspecified organism: Secondary | ICD-10-CM | POA: Diagnosis not present

## 2023-08-06 DIAGNOSIS — J189 Pneumonia, unspecified organism: Secondary | ICD-10-CM | POA: Diagnosis not present

## 2023-08-06 DIAGNOSIS — E870 Hyperosmolality and hypernatremia: Secondary | ICD-10-CM | POA: Diagnosis not present

## 2023-08-06 DIAGNOSIS — E860 Dehydration: Secondary | ICD-10-CM | POA: Diagnosis not present

## 2023-08-06 LAB — URINALYSIS, ROUTINE W REFLEX MICROSCOPIC
Bilirubin Urine: NEGATIVE
Glucose, UA: NEGATIVE mg/dL
Ketones, ur: 5 mg/dL — AB
Nitrite: NEGATIVE
Protein, ur: 30 mg/dL — AB
Specific Gravity, Urine: 1.021 (ref 1.005–1.030)
WBC, UA: >50 WBC/hpf (ref 0–5)
pH: 5 (ref 5.0–8.0)

## 2023-08-06 LAB — BASIC METABOLIC PANEL WITH GFR
Anion gap: 13 (ref 5–15)
BUN: 34 mg/dL — ABNORMAL HIGH (ref 8–23)
CO2: 18 mmol/L — ABNORMAL LOW (ref 22–32)
Calcium: 8.3 mg/dL — ABNORMAL LOW (ref 8.9–10.3)
Chloride: 116 mmol/L — ABNORMAL HIGH (ref 98–111)
Creatinine, Ser: 1.26 mg/dL — ABNORMAL HIGH (ref 0.61–1.24)
GFR, Estimated: >60 mL/min (ref >60)
Glucose, Bld: 114 mg/dL — ABNORMAL HIGH (ref 70–99)
Potassium: 3.1 mmol/L — ABNORMAL LOW (ref 3.5–5.1)
Sodium: 147 mmol/L — ABNORMAL HIGH (ref 135–145)

## 2023-08-06 LAB — CBC
HCT: 44.3 % (ref 39.0–52.0)
Hemoglobin: 14 g/dL (ref 13.0–17.0)
MCH: 31.8 pg (ref 26.0–34.0)
MCHC: 31.6 g/dL (ref 30.0–36.0)
MCV: 100.7 fL — ABNORMAL HIGH (ref 80.0–100.0)
Platelets: 191 K/uL (ref 150–400)
RBC: 4.4 MIL/uL (ref 4.22–5.81)
RDW: 14 % (ref 11.5–15.5)
WBC: 14.1 K/uL — ABNORMAL HIGH (ref 4.0–10.5)
nRBC: 0 % (ref 0.0–0.2)

## 2023-08-06 LAB — GLUCOSE, CAPILLARY
Glucose-Capillary: 113 mg/dL — ABNORMAL HIGH (ref 70–99)
Glucose-Capillary: 98 mg/dL (ref 70–99)
Glucose-Capillary: 81 mg/dL (ref 70–99)
Glucose-Capillary: 74 mg/dL (ref 70–99)

## 2023-08-06 LAB — STREP PNEUMONIAE URINARY ANTIGEN: Strep Pneumo Urinary Antigen: NEGATIVE

## 2023-08-06 LAB — LACTIC ACID, PLASMA: Lactic Acid, Venous: 1.3 mmol/L (ref 0.5–1.9)

## 2023-08-06 MED ORDER — SODIUM CHLORIDE 0.9 % IV SOLN
250.0000 mL | Freq: Once | INTRAVENOUS | Status: AC
  Administered 2023-08-06: 250 mL via INTRAVENOUS

## 2023-08-06 MED ORDER — VANCOMYCIN HCL 1500 MG/300ML IV SOLN
1500.0000 mg | INTRAVENOUS | Status: DC
  Administered 2023-08-06: 1500 mg via INTRAVENOUS
  Filled 2023-08-06: qty 300

## 2023-08-06 MED ORDER — SODIUM CHLORIDE 0.45 % IV SOLN
INTRAVENOUS | Status: DC
  Filled 2023-08-06 (×3): qty 75

## 2023-08-06 MED ORDER — POTASSIUM CHLORIDE 10 MEQ/100ML IV SOLN
10.0000 meq | INTRAVENOUS | Status: AC
  Administered 2023-08-06 (×4): 10 meq via INTRAVENOUS
  Filled 2023-08-06 (×4): qty 100

## 2023-08-06 MED ORDER — POTASSIUM CHLORIDE CRYS ER 20 MEQ PO TBCR: 40.0000 meq | EXTENDED_RELEASE_TABLET | Freq: Two times a day (BID) | ORAL | Status: DC

## 2023-08-06 MED ORDER — PIPERACILLIN-TAZOBACTAM 3.375 G IVPB
3.3750 g | Freq: Three times a day (TID) | INTRAVENOUS | Status: AC
  Administered 2023-08-06 – 2023-08-10 (×15): 3.375 g via INTRAVENOUS
  Filled 2023-08-06 (×15): qty 50

## 2023-08-06 NOTE — Evaluation (Addendum)
 Clinical/Bedside Swallow Evaluation Patient Details  Name: Maxwell Webb MRN: [id number] Date of Birth: January 25, 1950  Today's Date: 08/06/2023 Time: SLP Start Time (ACUTE ONLY): 0930 SLP Stop Time (ACUTE ONLY): 1010 SLP Time Calculation (min) (ACUTE ONLY): 40 min  Past Medical History:  Past Medical History:  Diagnosis Date   Anxiety    Depression    Hyperlipemia    Hypertension    Korsakoff disease (HCC)    Nicotine  abuse    Osteomyelitis (HCC)    PVD (peripheral vascular disease) (HCC)    Stroke (HCC)    Wernicke encephalopathy    Past Surgical History:  Past Surgical History:  Procedure Laterality Date   BELOW KNEE LEG AMPUTATION Bilateral    FINGER SURGERY     left index finger - as a child   HPI:  Pt is a 73 y.o. male with medical history significant of alcohol abuse, Wernicke Korsakoff syndrome, stroke, nonverbal at the baseline(?), HTN, HLD, DM, PVD, dCHF, depression with anxiety, s/p of bilateral BKA, who presents with tachycardia.  Per report, pt was noted to be tachycardic and less interactive in facility.  His heart rate was initially 142, which improved to 110s after giving IV fluid in ED.  Per ED physician, his mental status has improved and back to baseline after giving IV fluid. Patient has tachypnea with RR 38, no oxygen desaturation, 2 L oxygen was started in ED for comfort reason.   CXR: Minimal peripheral opacity at the left lung base, atelectasis versus  pneumonia.  Pt resides at [facility].    Assessment / Plan / Recommendation  Clinical Impression   Pt seen for BSE today(attempted assessment). Pt lethargic, drowsy w/ poor State arousal w/out MAX verbal/tactile stim- it was only sustained for seconds when he stirred. Eyes remained closed.  On [place] O2 support 2L; afebrile. WBC elevated.   Pt has a BASELINE dx of oropharyngeal phase Dysphagia per MBSS on 08/01/2023: moderate oropharyngeal dysphagia. Pt demonstrated oral holding and prolonged oral prep with both solids and  purees, inconsistently with liquids. Incomplete mastication of solids. Repetition lingual motion also noted throughout study. Piecemeal swallow and/or oral residual noted. Pharyngeally, pt with mild-moderate vallecular stasis with solids in which liquid wash via straw attributed to before the swallow aspiration. Residual was trace-unremarkable with other consistencies. Inconsistently delayed swallow initiation noted. Above mentioned deficits due to lingual weakness/incoordination, reduced BOT retraction, and mistimed swallow initiation. Deficits likely exacerbated by cognitive status. Recommend a pureed diet (runnier purees preferred with additional gravies, sauces, and condiments to moisten) with thin liquids Recommend safe swallowing strategies/aspiration including, but not limited to, 1:1 assistance with feeding, avoidance of straws, watch for pharyngeal swallow and clearance of oral cavity before next bite/sip, and upright as possible for PO intake.  Unsure of his progress/status at his NH re: swallowing and oral intake since that study ~1 week ago.   Pt did not sufficiently arouse for safe assessment of oral intake/po trials despite MAX verbal/tactile stim by this SLP w/ NSG support moving him about in bed. D/t this, po's were HELD and not given.   ST services will f/u tomorrow for ongoing assessment to determine a least restrictive oral diet, if able to. Recommend frequent oral care when awake for oral stim/swallowing and hygiene. Aspiration precautions. NSG and MD updated, agreed. Recommend Dietician f/u and Palliative Care consult for support/GOC. SLP Visit Diagnosis: Dysphagia, oropharyngeal phase (R13.12) (baseline Cogntiive decline; dependency w/ feeding)    Aspiration Risk  Severe aspiration risk;Risk for inadequate nutrition/hydration (  d/t lethargy currently)    Diet Recommendation   NPO  Medication Administration: Via alternative means    Other  Recommendations Recommended Consults:   (Palliative Care consult for GOC; Dietician f/u) Oral Care Recommendations: Oral care QID;Staff/trained caregiver to provide oral care Caregiver Recommendations:  (tbd)     Assistance Recommended at Discharge  FULL  Functional Status Assessment Patient has had a recent decline in their functional status and/or demonstrates limited ability to make significant improvements in function in a reasonable and predictable amount of time  Frequency and Duration min 2x/week  2 weeks       Prognosis Prognosis for improved oropharyngeal function: Guarded (-Fair) Barriers to Reach Goals: Cognitive deficits;Language deficits;Time post onset;Severity of deficits Barriers/Prognosis Comment: Baseline Cognitive decline      Swallow Study   General Date of Onset: 08/05/23 HPI: Pt is a 73 y.o. male with medical history significant of alcohol abuse, Wernicke Korsakoff syndrome, stroke, nonverbal at the baseline(?), HTN, HLD, DM, PVD, dCHF, depression with anxiety, s/p of bilateral BKA, who presents with tachycardia.  Per report, pt was noted to be tachycardic and less interactive in facility.  His heart rate was initially 142, which improved to 110s after giving IV fluid in ED.  Per ED physician, his mental status has improved and back to baseline after giving IV fluid. Patient has tachypnea with RR 38, no oxygen desaturation, 2 L oxygen was started in ED for comfort reason.   CXR: Minimal peripheral opacity at the left lung base, atelectasis versus  pneumonia.  Pt resides at [facility]. Type of Study: Bedside Swallow Evaluation Previous Swallow Assessment: MBSS- 08/01/2023: moderate oropharyngeal dysphagia. Pt demonstrated oral holding and prolonged oral prep with both solids and purees, inconsistently with liquids. Incomplete mastication of solids. Repetition lingual motion also noted throughout study. Piecemeal swallow and/or oral residual noted. Pharyngeally, pt with mild-moderate vallecular stasis with solids in which  liquid wash via straw attributed to before the swallow aspiration. Residual was trace-unremarkable with other consistencies. Inconsistently delayed swallow initiation noted. Above mentioned deficits due to lingual weakness/incoordination, reduced BOT retraction, and mistimed swallow initiation. Deficits likely exacerbated by cognitive status. Recommend a pureed diet (runnier purees preferred with additional gravies, sauces, and condiments to moisten) with thin liquids Recommend safe swallowing strategies/aspiration including, but not limited to, 1:1 assistance with feeding, avoidance of straws, watch for pharyngeal swallow and clearance of oral cavity before next bite/sip, and upright as possible for PO intake. Diet Prior to this Study: Dysphagia 2 (finely chopped);Thin liquids (Level 0) (at admit) Temperature Spikes Noted: No (wbc 14.1; Sodium elevated 147+) Respiratory Status: Nasal cannula (2L) History of Recent Intubation: No Behavior/Cognition: Lethargic/Drowsy;Doesn't follow directions Oral Cavity Assessment: Dry Oral Care Completed by SLP: Yes (attempted) Oral Cavity - Dentition: Adequate natural dentition;Poor condition;Missing dentition Vision:  (n/a) Self-Feeding Abilities:  (NT) Patient Positioning: Upright in bed (full assist) Baseline Vocal Quality:  (nonverbal) Volitional Cough: Cognitively unable to elicit Volitional Swallow: Unable to elicit    Oral/Motor/Sensory Function Overall Oral Motor/Sensory Function: Generalized oral weakness (could not fully assess)   Ice Chips   NT   Thin Liquid   NT   Nectar Thick   NT  Honey Thick   NT  Puree   NT  Solid       NT       [name], MS, CCC-SLP Speech Language Pathologist Rehab Services; [facility] [phone] (ascom) Janese Radabaugh 08/06/2023,6:13 PM

## 2023-08-06 NOTE — Consult Note (Addendum)
 WOC Nurse Consult Note: Reason for Consult: Consult requested for left buttock and sacrum.  Performed remotely after review of progress notes and photos in the EMR.  Wound type: Left buttock with Stage 2 pressure injury, red and moist Sacrum and across bilat buttocks dark red-purple Deep tissue pressure injury, surrounded by red moist macerated moisture associated skin damage. Deep tissue pressure injuries are high risk to evolved into full thickness skin loss within 7-10 days. Pressure Injury POA: Yes Dressing procedure/placement/frequency: Topical treatment orders provided for bedside nurses to perform as follows: Apply Xeroform gauze to left buttock and sacrum Q day, then cover with foam dressing.  Change foam dressing Q 3 days or PRN soiling.  Please re-consult if further assistance is needed.  Thank-you,  Stephane Fought MSN, RN, CWOCN, CWCN-AP, CNS Contact Mon-Fri 0700-1500: [phone]

## 2023-08-06 NOTE — Progress Notes (Signed)
 Pharmacy Antibiotic Note  Maxwell Webb is a 73 y.o. male with hx of bilateral BKA admitted on 08/05/2023 with HCAP vs. aspiration pneumonia .  Pharmacy has been consulted for Zosyn  and Vancomycin  dosing.  Plan: Pt given Vancomycin  2000 mg once. Vancomycin  1500 mg IV Q 24 hrs. Goal AUC 400-550. Expected AUC: 538.3 SCr used: 1.26, Ht 5' 7 (Ht ~6' 8 prior to BKA)  Pharmacy will continue to follow and will adjust abx dosing whenever warranted.  Temp (24hrs), Avg:98.1 F (36.7 C), Min:97.5 F (36.4 C), Max:98.6 F (37 C)   Recent Labs  Lab 08/05/23 1645 08/05/23 2011 08/05/23 2321  WBC 20.7*  --   --   CREATININE 1.60*  --   --   LATICACIDVEN 5.2* 3.2* 2.3*    Estimated Creatinine Clearance: 43.8 mL/min (A) (by C-G formula based on SCr of 1.6 mg/dL (H)).    No Known Allergies  Antimicrobials this admission: 7/21 Cefepime  >> x 1 dose 7/21 Vancomycin  >>  7/22 Zosyn  >>   Microbiology results: 7/21 BCx: Pending 7/21 UCx: Pending 7/22 Sputum: Pending   Thank you for allowing pharmacy to be a part of this patient's care.  Rankin CANDIE Dills, PharmD, [facility] 08/06/2023 3:18 AM

## 2023-08-06 NOTE — Progress Notes (Signed)
 [facility]  - [place] at [facility]   PATIENT NAME: Maxwell Webb    MR#:  [id number]  DATE OF BIRTH:  Jun 15, 1950  SUBJECTIVE:  unable to obtain any history review system. Patient is void of state. No family at bedside. Patient apparently was brought in from nursing home for tachycardia. Open size to verbal command. Does not communicate much. Did squeeze my fingers.    VITALS:  Blood pressure 94/72, pulse 89, temperature 98.6 F (37 C), resp. rate 18, height 5' 7 (1.702 m), weight 83.9 kg, SpO2 97%.  PHYSICAL EXAMINATION:  limited exam due to patient's baseline mental status GENERAL:  73 y.o.-year-old patient with no acute distress.  LUNGS: Normal breath sounds bilaterally, no wheezing CARDIOVASCULAR: S1, S2 normal. No murmur tachycardia ABDOMEN: Soft, nontender, nondistended. EXTREMITIES: No  edema b/l.    NEUROLOGIC: noncommunicative. Moves extremities spontaneously  SKIN: per RN  LABORATORY PANEL:  CBC Recent Labs  Lab 08/06/23 0506  WBC 14.1*  HGB 14.0  HCT 44.3  PLT 191    Chemistries  Recent Labs  Lab 08/05/23 1645 08/06/23 0506  NA 148* 147*  K 3.9 3.1*  CL 108 116*  CO2 22 18*  GLUCOSE 158* 114*  BUN 33* 34*  CREATININE 1.60* 1.26*  CALCIUM  9.6 8.3*  AST 27  --   ALT 9  --   ALKPHOS 68  --   BILITOT 0.5  --    Cardiac Enzymes No results for input(s): TROPONINI in the last 168 hours. RADIOLOGY:  DG Chest Port 1 View Result Date: 08/05/2023 CLINICAL DATA:  Provided history: Questionable sepsis - evaluate for abnormality Altered with tachycardia. EXAM: PORTABLE CHEST 1 VIEW COMPARISON:  Radiograph 02/03/2019 FINDINGS: Minimal peripheral opacity at the left lung base. Right lung is clear. Normal heart size with stable mediastinal contours. Aortic atherosclerosis. No pulmonary edema, large pleural effusion, or pneumothorax. IMPRESSION: Minimal peripheral opacity at the left lung base, atelectasis versus pneumonia. Electronically Signed    By: Andrea Gasman M.D.   On: 08/05/2023 16:54    Assessment and Plan  Maxwell Webb is a 73 y.o. male with medical history significant of alcohol abuse, Wernicke Korsakoff syndrome, stroke, nonverbal at the baseline, HTN, HLD, DM, PVD, dCHF, depression with anxiety, s/p of bilateral BKA, who presents with tachycardia.    Severe sepsis due to possible HCAP (healthcare-associated pneumonia): Patient meets critical for severe sepsis with WBC 20.7, heart rate up to 140, RR 38, lactic acid 5.2, procalcitonin 0.32.  -- Chest x-ray showed left basilar opacity.  Patient may have HCAP versus aspiration pneumonia. -- UA. Suggestive UTI  --IV Vancomycin  and Zosyn  (pt received 1 dose of cefepime  in ED)--BC neg. Will dc vanc if MRSA screen neg - RESP panel --> negative -Lactic 5.7--1.3   Dehydration and hypernatremia: Na 148 -IVF as above -continue 1/2 NS with kcl and bicarb   AKI (acute kidney injury) (HCC): Baseline creatinine 0.87 02/05/2019.  His creatinine is 1.60, BUN 33, GFR 46, no recent baseline creatinine available. -Pending UA.  - IV fluid as above - Avoid using renal toxic medications.   Chronic diastolic CHF (congestive heart failure) (HCC): 2D echo on 01/04/2016 showed EF of 55 to 60% with grade 1 diastolic dysfunction.  BNP is elevated 382, but no leg edema or JVD.  Does not seem to have CHF exacerbation. -Hold diuretics due to severe sepsis   HTN (hypertension): -IV hydralazine  as needed - Hold metoprolol  due to severe sepsis and high risk of developing  hypotension   HLD (hyperlipidemia) -Lipitor   Diabetes mellitus without complication [employer] [facility]): Recent A1c 6.2, well-controlled.  Patient is taking metformin -SSI   Stroke (HCC) -Lipitor   Depression with anxiety -Continue home medications   S/P bilateral BKA (below knee amputation) (HCC) - Fall precaution   H/o Wernicki's/Korsakoff syndrome --bed bound and not much communicative    Procedures: Family communication  :none today. Pt has legal guardian Consults :Palliative care CODE STATUS: FULL DVT Prophylaxis :enoxaparin  Level of care: Progressive Status is: Inpatient Remains inpatient appropriate because: sepsis    TOTAL TIME TAKING CARE OF THIS PATIENT:45 minutes.  >50% time spent on counselling and coordination of care  Note: This dictation was prepared with Dragon dictation along with smaller phrase technology. Any transcriptional errors that result from this process are unintentional.  [name] M.D    [facility]   CC: Primary care physician; [facility]

## 2023-08-06 NOTE — Consult Note (Signed)
 Consultation Note Date: 08/06/2023 at 1300  Patient Name: Genesis Paget  DOB: Nov 26, 1950  MRN: [id number]  Age / Sex: 73 y.o., male  PCP: [facility] Referring Physician: Tobie Calix, MD  HPI/Patient Profile: 73 y.o. male  with past medical history of EtOH abuse, Warnicke Korsakoff syndrome, stroke, HTN, HLD, type 2 diabetes, PVD, diastolic CHF, depression, anxiety, s/p bilateral BKA, and nonverbal at baseline admitted from [facility] on 08/05/2023 with tachycardia and tachypnea.  Patient is being treated for severe sepsis due to possible HCAP versus aspiration pneumonia, dehydration, hypernatremia, and AKI.  Given patient's overall poor prognosis, PMT was consulted and she worked with patient and his legal guardian to address code status and boundaries/goals of care.    Clinical Assessment and Goals of Care: Extensive chart review completed prior to meeting patient including labs, vital signs, imaging, progress notes, orders, and available advanced directive documents from current and previous encounters. I then met with patient at bedside.  He was sleeping but awakened when RN did not gaze strong physical stimuli/chest ROM.  He awakens momentarily to meet my gaze but then quickly fell back to sleep.  He is unable to participate in goals of care or medical decision making independently at this time.  Discussed patient's condition and symptoms with RN at bedside.  She endorses that patient is in no distress.  She shares that he is minimally responsive.  She shares that when he is awake and alert, he whispers the word yes but does not do it consistently and is unclear on how much she is actually able to comprehend.  She shares that his wounds were just changed and patient showed no signs of pain/moaning/groaning during dressing changes and turning.  No adjustment to [facility] needed at this  time.  After meeting with patient, I attempted to speak with patient's legal guardian Candace over the phone to discuss diagnosis prognosis, GOC, EOL wishes, disposition and options.  No answer.  HIPAA compliant voicemail with PMT contact info left.  Awaiting return phone call from Candace with DSS to continue goals of care discussions.  No change to plan of care at this time.  PMT will continue to follow.  Primary Decision Maker LEGAL GUARDIAN  Physical Exam Vitals reviewed.  Constitutional:      General: He is not in acute distress.    Appearance: He is normal weight. He is not ill-appearing.  HENT:     Nose: Nose normal.  Cardiovascular:     Rate and Rhythm: Normal rate.  Pulmonary:     Effort: Pulmonary effort is normal.  Abdominal:     Palpations: Abdomen is soft.  Skin:    General: Skin is warm and dry.     Coloration: Skin is pale.     Palliative Assessment/Data: 30%     Thank you for this consult. Palliative medicine will continue to follow and assist holistically.   Time Total: 50 minutes  Time spent includes: Detailed review of medical records (labs, imaging, vital signs), medically appropriate  exam (mental status, respiratory, cardiac, skin), discussed with treatment team, counseling and educating patient, family and staff, documenting clinical information, medication management and coordination of care.  Signed by: Lamarr Gunner, DNP, FNP-BC Palliative Medicine   Please contact Palliative Medicine Team providers via [facility] for questions and concerns.

## 2023-08-07 DIAGNOSIS — N179 Acute kidney failure, unspecified: Secondary | ICD-10-CM | POA: Diagnosis not present

## 2023-08-07 DIAGNOSIS — A419 Sepsis, unspecified organism: Secondary | ICD-10-CM | POA: Diagnosis not present

## 2023-08-07 DIAGNOSIS — Z515 Encounter for palliative care: Secondary | ICD-10-CM

## 2023-08-07 DIAGNOSIS — E860 Dehydration: Secondary | ICD-10-CM | POA: Diagnosis not present

## 2023-08-07 DIAGNOSIS — J189 Pneumonia, unspecified organism: Secondary | ICD-10-CM | POA: Diagnosis not present

## 2023-08-07 LAB — BASIC METABOLIC PANEL WITH GFR
Anion gap: 11 (ref 5–15)
BUN: 32 mg/dL — ABNORMAL HIGH (ref 8–23)
CO2: 20 mmol/L — ABNORMAL LOW (ref 22–32)
Calcium: 8 mg/dL — ABNORMAL LOW (ref 8.9–10.3)
Chloride: 118 mmol/L — ABNORMAL HIGH (ref 98–111)
Creatinine, Ser: 1 mg/dL (ref 0.61–1.24)
GFR, Estimated: >60 mL/min (ref >60)
Glucose, Bld: 92 mg/dL (ref 70–99)
Potassium: 3.2 mmol/L — ABNORMAL LOW (ref 3.5–5.1)
Sodium: 149 mmol/L — ABNORMAL HIGH (ref 135–145)

## 2023-08-07 LAB — GLUCOSE, CAPILLARY
Glucose-Capillary: 97 mg/dL (ref 70–99)
Glucose-Capillary: 95 mg/dL (ref 70–99)

## 2023-08-07 LAB — URINE CULTURE: Culture: <10000 — AB

## 2023-08-07 LAB — MAGNESIUM: Magnesium: 2 mg/dL (ref 1.7–2.4)

## 2023-08-07 LAB — MRSA NEXT GEN BY PCR, NASAL: MRSA by PCR Next Gen: NOT DETECTED

## 2023-08-07 MED ORDER — POTASSIUM CL IN DEXTROSE 5% 20 MEQ/L IV SOLN
20.0000 meq | INTRAVENOUS | Status: AC
  Administered 2023-08-07 (×2): 20 meq via INTRAVENOUS
  Filled 2023-08-07 (×4): qty 1000

## 2023-08-07 MED ORDER — POTASSIUM CHLORIDE 10 MEQ/100ML IV SOLN
10.0000 meq | INTRAVENOUS | Status: AC
  Administered 2023-08-07 (×6): 10 meq via INTRAVENOUS
  Filled 2023-08-07 (×6): qty 100

## 2023-08-07 NOTE — Progress Notes (Signed)
 Speech Language Pathology Treatment: Dysphagia  Patient Details Name: Maxwell Webb MRN: [id number] DOB: Jun 09, 1950 Today's Date: 08/07/2023 Time: 9054-8964 SLP Time Calculation (min) (ACUTE ONLY): 50 min  Assessment / Plan / Recommendation Clinical Impression  Pt seen for ongoing assessment of swallowing; safety for oral intake. Pt more alert, State improved somewhat from yesterday but pt remains drowsy and mostly nonverbal w/ Slow Responses overall despite verbal/tactile stim. He responded to the visual presentation of swabs/spoon to mouth/lips. OF NOTE: extensive oral care given w/ MOD amount of debris/phlegm removed. Unsure of pt is able to manage own secretions/clear mouth fully d/t declined awareness/State(?). On [place] O2 support 2L; afebrile. WBC elevated, trending down.    Pt has a BASELINE dx of oropharyngeal phase Dysphagia per MBSS on 08/01/2023: moderate oropharyngeal dysphagia. Pt demonstrated oral holding and prolonged oral prep with both solids and purees, inconsistently with liquids. Incomplete mastication of solids. Repetition lingual motion also noted throughout study. Piecemeal swallow and/or oral residual noted. Pharyngeally, pt with mild-moderate vallecular stasis with solids in which liquid wash via straw attributed to before the swallow aspiration. Residual was trace-unremarkable with other consistencies. Inconsistently delayed swallow initiation noted. Above mentioned deficits due to lingual weakness/incoordination, reduced BOT retraction, and mistimed swallow initiation. Deficits likely exacerbated by cognitive status. Recommend a pureed diet (runnier purees preferred with additional gravies, sauces, and condiments to moisten) with thin liquids Recommend safe swallowing strategies/aspiration including, but not limited to, 1:1 assistance with feeding, avoidance of straws, watch for pharyngeal swallow and clearance of oral cavity before next bite/sip, and upright as possible for PO  intake.  Unsure of his progress/status at his NH re: swallowing and oral intake since that study ~1 week ago.   Pt aroused for po trials given MOD verbal/tactile stim by this SLP w/ NSG support in moving him up/forward in bed. Oral care was completed. Po trials of Nectar liquids via tsp and loose puree were given, alternating the trials to aid A-P transfer/swallow/oral clearing. Oral phase deficits c/b oral holding, bolus residue post swallow(removed by swab), and slow/min lingual/labial movements. Pharyngeal phase c/b reduced hyolaryngeal excursion via palpation at neck and a much delayed, congested cough -- unsure if related to similar at Baseline(as heard prior to po's).  OM exam was limited d/t pt's poor follow through w/ instructions. Minimal engagement/response overall.    Pt appears at HIGH risk for aspiration/aspiration pneumonia w/ current swallowing presentation -- oropharyngeal phase Dysphagia presentation. Recommend continued NPO status w/ frequent oral care when awake for oral stim/swallowing and hygiene. Aspiration precautions. ST services will follow for ongoing assessment to determine a least restrictive oral diet, if able to. NSG and MD updated, agreed. Recommend Dietician f/u and Palliative Care consult for support/GOC.     HPI HPI: Pt is a 73 y.o. male with medical history significant of alcohol abuse, Wernicke Korsakoff syndrome, stroke, nonverbal at the baseline(?), HTN, HLD, DM, PVD, dCHF, depression with anxiety, s/p of bilateral BKA, who presents with tachycardia.  Per report, pt was noted to be tachycardic and less interactive in facility.  His heart rate was initially 142, which improved to 110s after giving IV fluid in ED.  Per ED physician, his mental status has improved and back to baseline after giving IV fluid. Patient has tachypnea with RR 38, no oxygen desaturation, 2 L oxygen was started in ED for comfort reason.   CXR: Minimal peripheral opacity at the left lung base,  atelectasis versus  pneumonia.  Pt resides at [facility]. Recent MBSS (OP)  07/2023 - see results in Imaging. A Pureed diet and thin liquids recommended then d/t Dysphagia noted.      SLP Plan  Continue with current plan of care (mbss tbd)          Recommendations  Diet recommendations: NPO (oral care) Medication Administration: Via alternative means                 (Palliative Care for GOC; Dietician) Oral care QID;Staff/trained caregiver to provide oral care   Frequent or constant Supervision/Assistance Dysphagia, oropharyngeal phase (R13.12) (baseline Cogntiive decline; dependency w/ feeding)     Continue with current plan of care (mbss tbd)      [name], MS, CCC-SLP Speech Language Pathologist Rehab Services; [facility] [phone] (ascom) Avonda Toso  08/07/2023, 6:19 PM

## 2023-08-07 NOTE — Care Management Important Message (Signed)
 mailImportant Message  Patient Details  Name: Maxwell Webb MRN: [id number] Date of Birth: [birth year]   Important Message Given:  mailed to [facility] DSS patients guardian      [name] 08/07/2023, 12:22 PM

## 2023-08-07 NOTE — Care Management Important Message (Signed)
 Important Message  Patient Details  Name: Maxwell Webb MRN: [id number] Date of Birth: 1950/05/20   Important Message Given:  Telephone call to [place] DSS/patients guardian,left message to go over IMM.      [name] 08/07/2023, 12:20 PM

## 2023-08-07 NOTE — Progress Notes (Addendum)
 [facility]  - [place] at [facility]   PATIENT NAME: Maxwell Webb    MR#:  [id number]  DATE OF BIRTH:  06-03-1950  SUBJECTIVE:  unable to obtain any history review system. Patient is ward of state. No family at bedside. Patient apparently was brought in from nursing home for tachycardia. Open size to verbal command. Does not communicate much.     VITALS:  Blood pressure 104/79, pulse 73, temperature 98.2 F (36.8 C), resp. rate 16, height 5' 7 (1.702 m), weight 82.7 kg, SpO2 98%.  PHYSICAL EXAMINATION:  limited exam due to patient's baseline mental status GENERAL:  73 y.o.-year-old patient with no acute distress. Poor Oral hygiene LUNGS: Normal breath sounds bilaterally, no wheezing CARDIOVASCULAR: S1, S2 normal. No murmur tachycardia ABDOMEN: Soft, nontender, nondistended. NEUROLOGIC: noncommunicative. Moves extremities spontaneously  SKIN:  Left buttock with Stage 2 pressure injury, red and moist Sacrum and across bilat buttocks dark red-purple Deep tissue pressure injury, surrounded by red moist macerated moisture associated skin damage. Deep tissue pressure injuries are high risk to evolved into full thickness skin loss within 7-10 days. Pressure Injury POA: Yes   LABORATORY PANEL:  CBC Recent Labs  Lab 08/06/23 0506  WBC 14.1*  HGB 14.0  HCT 44.3  PLT 191    Chemistries  Recent Labs  Lab 08/05/23 1645 08/06/23 0506 08/07/23 0249  NA 148*   < > 149*  K 3.9   < > 3.2*  CL 108   < > 118*  CO2 22   < > 20*  GLUCOSE 158*   < > 92  BUN 33*   < > 32*  CREATININE 1.60*   < > 1.00  CALCIUM  9.6   < > 8.0*  MG  --   --  2.0  AST 27  --   --   ALT 9  --   --   ALKPHOS 68  --   --   BILITOT 0.5  --   --    < > = values in this interval not displayed.   Cardiac Enzymes No results for input(s): TROPONINI in the last 168 hours. RADIOLOGY:  DG Chest Port 1 View Result Date: 08/05/2023 CLINICAL DATA:  Provided history: Questionable sepsis -  evaluate for abnormality Altered with tachycardia. EXAM: PORTABLE CHEST 1 VIEW COMPARISON:  Radiograph 02/03/2019 FINDINGS: Minimal peripheral opacity at the left lung base. Right lung is clear. Normal heart size with stable mediastinal contours. Aortic atherosclerosis. No pulmonary edema, large pleural effusion, or pneumothorax. IMPRESSION: Minimal peripheral opacity at the left lung base, atelectasis versus pneumonia. Electronically Signed   By: Andrea Gasman M.D.   On: 08/05/2023 16:54    Assessment and Plan  Maxwell Webb is a 73 y.o. male with medical history significant of alcohol abuse, Wernicke Korsakoff syndrome, stroke, nonverbal at the baseline, HTN, HLD, DM, PVD, dCHF, depression with anxiety, s/p of bilateral BKA, who presents with tachycardia.    Severe sepsis due to possible HCAP (healthcare-associated pneumonia): Patient meets critical for severe sepsis with WBC 20.7, heart rate up to 140, RR 38, lactic acid 5.2, procalcitonin 0.32.  -- Chest x-ray showed left basilar opacity.  Patient may have HCAP versus aspiration pneumonia. -- UA. Suggestive UTI  --IV Vancomycin  and Zosyn  (pt received 1 dose of cefepime  in ED)--BC neg. Will dc vanc if MRSA screen neg - RESP panel --> negative -Lactic 5.7--1.3 --cont iv zosyn  --MRSA neg --UC <10K insignificant growth   Dehydration   AKI (acute kidney  injury) [facility]): Acidosis Severe hypernatremia due to poor PO intake secondary to issues with mentation Baseline creatinine 0.87 02/05/2019.  His creatinine is 1.60, BUN 33, GFR 46, no recent baseline creatinine available. -Pending UA.  - IV fluid as above - Avoid using renal toxic medications. --sodium 147--153 -- start IV D5 water with potassium -- received bicarb drip   Chronic diastolic CHF (congestive heart failure) (HCC): 2D echo on 01/04/2016 showed EF of 55 to 60% with grade 1 diastolic dysfunction.  BNP is elevated 382, but no leg edema or JVD.  Does not seem to have CHF  exacerbation. -Hold diuretics due to severe sepsis   HTN (hypertension): -IV hydralazine  as needed - Hold metoprolol  due to severe sepsis and high risk of developing hypotension   HLD (hyperlipidemia) -Lipitor--not taking po yet   Diabetes mellitus without complication (HCC): Recent A1c 6.2, well-controlled.  Home med metformin -SSI   Stroke (HCC) -Lipitor   Depression with anxiety -po meds on hold   S/P bilateral BKA (below knee amputation) (HCC) - Fall precaution   H/o Wernicki's/Korsakoff syndrome failure to thrive/deconditioning difficulty swallowing --bed bound and not much communicative -- patient seen by speech therapist. Very high risk for aspiration. Not tolerating oral feeds. Keep patient NPO.  Palliative care consultation appreciated.  I spoke on the phone with DSS legal guardian Candace Goble. Discussed overall poor prognosis with patient. Legal guardian will discuss with her team and get back with me.   Procedures: Family communication :legal guardian Candace Consults :Palliative care CODE STATUS: FULL DVT Prophylaxis :enoxaparin  Level of care: Progressive Status is: Inpatient Remains inpatient appropriate because: sepsis, failure to thrive, AK I, elevated sodium   Addendum: d/w DSS legal guardian Candace Gobble on the phone--they are OK with DNR/DNI. I mentioned to her that will hydrate for a 1-2 days and see if he perks up to take oral diet--if not then we should consider Hospice/comfort care--she is agreeable.   TOTAL TIME TAKING CARE OF THIS PATIENT:45 minutes.  >50% time spent on counselling and coordination of care  Note: This dictation was prepared with Dragon dictation along with smaller phrase technology. Any transcriptional errors that result from this process are unintentional.  [name] M.D    [facility]   CC: Primary care physician; [facility]

## 2023-08-07 NOTE — Progress Notes (Addendum)
                                                     Palliative Care Progress Note, Assessment & Plan   Patient Name: Maxwell Webb       Date: 08/07/2023 DOB: 1950/06/21  Age: 73 y.o. MRN#: [id number] Attending Physician: Tobie Calix, MD Primary Care Physician: [facility] Admit Date: 08/05/2023  Subjective: Patient is lying in bed, leaning to his right side, sleeping in no apparent distress.  He does not awaken to verbal stimuli but awakens to strong physical stimuli.  However, he quickly returns to sleep.  No family or friends present during my visit.  HPI: 73 y.o. male  with past medical history of EtOH abuse, Warnicke Korsakoff syndrome, stroke, HTN, HLD, type 2 diabetes, PVD, diastolic CHF, depression, anxiety, s/p bilateral BKA, and nonverbal at baseline admitted from [facility] on 08/05/2023 with tachycardia and tachypnea.   Patient is being treated for severe sepsis due to possible HCAP versus aspiration pneumonia, dehydration, hypernatremia, and AKI.   Given patient's overall poor prognosis, PMT was consulted and she worked with patient and his legal guardian to address code status and boundaries/goals of care.  Summary of counseling/coordination of care: Extensive chart review completed prior to meeting patient including labs, vital signs, imaging, progress notes, orders, and available advanced directive documents from current and previous encounters.   After reviewing the patient's chart and assessing the patient at bedside, I assessed patient's symptoms.  Nonverbal signs of distress such as moaning/groaning, fidgeting, grimacing, or brow furrowing not noted.  Respirations are even and unlabored.  Patient is resting comfortably, though remains unable to participate in symptom assessment verbally, goals of care discussions, or  medical decision making independently at this time.  After visiting with patient, I attempted to speak with his DSS legal guardian Maxwell Webb as well as DSS asst. Director Maxwell Webb over the phone.  No answer.  HIPAA compliant voicemails left for both women with PMT contact info given.  PMT will continue to follow and support patient throughout his hospitalization.  Physical Exam Vitals reviewed.  HENT:     Mouth/Throat:     Mouth: Mucous membranes are moist.  Pulmonary:     Effort: Pulmonary effort is normal.  Abdominal:     Palpations: Abdomen is soft.  Skin:    General: Skin is warm and dry.     Coloration: Skin is pale.  Neurological:     Comments: nonverbal            Total Time 25 minutes   Time spent includes: Detailed review of medical records (labs, imaging, vital signs), medically appropriate exam (mental status, respiratory, cardiac, skin), discussed with treatment team, counseling and educating patient, family and staff, documenting clinical information, medication management and coordination of care.  Maxwell L. Arvid, DNP, FNP-BC Palliative Medicine Team

## 2023-08-08 DIAGNOSIS — A419 Sepsis, unspecified organism: Secondary | ICD-10-CM | POA: Diagnosis not present

## 2023-08-08 DIAGNOSIS — R6520 Severe sepsis without septic shock: Secondary | ICD-10-CM | POA: Diagnosis not present

## 2023-08-08 LAB — BASIC METABOLIC PANEL WITH GFR
Anion gap: 9 (ref 5–15)
BUN: 26 mg/dL — ABNORMAL HIGH (ref 8–23)
CO2: 23 mmol/L (ref 22–32)
Calcium: 8.1 mg/dL — ABNORMAL LOW (ref 8.9–10.3)
Chloride: 117 mmol/L — ABNORMAL HIGH (ref 98–111)
Creatinine, Ser: 0.6 mg/dL — ABNORMAL LOW (ref 0.61–1.24)
GFR, Estimated: >60 mL/min (ref >60)
Glucose, Bld: 104 mg/dL — ABNORMAL HIGH (ref 70–99)
Potassium: 3.3 mmol/L — ABNORMAL LOW (ref 3.5–5.1)
Sodium: 149 mmol/L — ABNORMAL HIGH (ref 135–145)

## 2023-08-08 LAB — GLUCOSE, CAPILLARY
Glucose-Capillary: 109 mg/dL — ABNORMAL HIGH (ref 70–99)
Glucose-Capillary: 126 mg/dL — ABNORMAL HIGH (ref 70–99)
Glucose-Capillary: 114 mg/dL — ABNORMAL HIGH (ref 70–99)

## 2023-08-08 LAB — POTASSIUM: Potassium: 3.5 mmol/L (ref 3.5–5.1)

## 2023-08-08 LAB — MAGNESIUM: Magnesium: 2.2 mg/dL (ref 1.7–2.4)

## 2023-08-08 MED ORDER — POTASSIUM CHLORIDE 10 MEQ/100ML IV SOLN
10.0000 meq | INTRAVENOUS | Status: AC
  Administered 2023-08-08 (×6): 10 meq via INTRAVENOUS
  Filled 2023-08-08 (×6): qty 100

## 2023-08-08 MED ORDER — POTASSIUM CHLORIDE 10 MEQ/100ML IV SOLN
10.0000 meq | INTRAVENOUS | Status: AC
  Administered 2023-08-08 (×2): 10 meq via INTRAVENOUS
  Filled 2023-08-08 (×2): qty 100

## 2023-08-08 NOTE — Care Management Important Message (Signed)
 Important Message  Patient Details  Name: Maxwell Webb MRN: [id number] Date of Birth: 1950-07-20   Important Message Given:  Yes - Medicare IM     [name] 08/08/2023, 4:28 PM

## 2023-08-08 NOTE — Consult Note (Signed)
 PHARMACY CONSULT NOTE - FOLLOW UP  Pharmacy Consult for Electrolyte Monitoring and Replacement   Recent Labs: Potassium (mmol/L)  Date Value  08/08/2023 3.5   Magnesium (mg/dL)  Date Value  92/75/7974 2.2   Calcium  (mg/dL)  Date Value  92/75/7974 8.1 (L)   Albumin (g/dL)  Date Value  92/78/7974 3.1 (L)   Sodium (mmol/L)  Date Value  08/08/2023 149 (H)     Assessment: 73 y.o. male with medical history significant of alcohol abuse, Wernicke Korsakoff syndrome, stroke, nonverbal at the baseline, HTN, HLD, DM, PVD, dCHF, depression with anxiety, s/p of bilateral BKA, who presents with tachycardia.   Pt received six Kcl runs 7/23 and no improvement was seen in potassium  Pt is not taking anything by mouth currently.   Goal of Therapy:  Electrolytes WNL  Plan:  ---IV KCl 10 mEq x  1 ---recheck electrolytes in am 07/25   Adriana JONETTA Bolster ,PharmD Clinical Pharmacist 08/08/2023 6:41 PM

## 2023-08-08 NOTE — Progress Notes (Addendum)
 Speech Language Pathology Treatment: Dysphagia  Patient Details Name: Maxwell Webb MRN: [id number] DOB: October 06, 1950 Today's Date: 08/08/2023 Time: 8754-8654 SLP Time Calculation (min) (ACUTE ONLY): 60 min  Assessment / Plan / Recommendation Clinical Impression  Pt seen for ongoing assessment of swallowing; safety for oral intake. Pt more alert, State improved from yesterday w/ min more awareness of environment. Daughter present. 2 verbal (words) responses w/ Slow Responses overall. He responded to the visual presentation of swabs/spoon to mouth/lips. Oral care provided w/ less debris today. On RA; afebrile.     Pt has a BASELINE dx of oropharyngeal phase Dysphagia per MBSS on 08/01/2023: moderate oropharyngeal dysphagia. Pt demonstrated oral holding and prolonged oral prep with both solids and purees, inconsistently with liquids. Incomplete mastication of solids. Repetition lingual motion also noted throughout study. Piecemeal swallow and/or oral residual noted. Pharyngeally, pt with mild-moderate vallecular stasis with solids in which liquid wash via straw attributed to before the swallow aspiration. Residual was trace-unremarkable with other consistencies. Inconsistently delayed swallow initiation noted. Above mentioned deficits due to lingual weakness/incoordination, reduced BOT retraction, and mistimed swallow initiation. Deficits likely exacerbated by cognitive status. Recommend a pureed diet (runnier purees preferred with additional gravies, sauces, and condiments to moisten) with thin liquids Recommend safe swallowing strategies/aspiration including, but not limited to, 1:1 assistance with feeding, avoidance of straws, watch for pharyngeal swallow and clearance of oral cavity before next bite/sip, and upright as possible for PO intake.  Unsure of his progress/status at his NH re: swallowing and oral intake since that study ~1 week ago.   Pt awake for po trials; MIN+ verbal/tactile stim by this SLP  w/ NSG support in moving him up/forward in bed. Oral care was completed. Po trials of Nectar liquids via tsp and loose puree were given, alternating the trials to aid A-P transfer/swallow/oral clearing. Oral phase deficits c/b intermittent oral holding, min bolus residue post swallow, and slow/min lingual/labial movements towards end of session- suspect a fatigue factor w/ the tasks(both mental and physical). Pharyngeal phase c/b min reduced hyolaryngeal excursion via palpation at neck but improved from yesterday, and a slight, delayed congested cough 1x -- unsure if related to similar at Baseline(as heard prior to po's).  OM movements revealed more oral engagement w/ the po trials today.     Pt appears at risk for aspiration/aspiration pneumonia but this appears reduced when following aspiration precautions and given feeding assistance/Supervision by Staff using a Dysphagia level 1(puree) w/ Nectar liquids via TSP diet. Recommend frequent oral care when awake and before/after intake; Small/Single bites/sips w/ TIME b/t bites for oral clearing. Alternate foods/liquids to aid oral clearing. Aspiration precautions.  ST services will follow for toleration of diet and ongoing assessment to determine a least restrictive oral diet; education. NSG and MD updated, agreed. Recommend Dietician f/u and Palliative Care consult for support/GOC.       HPI HPI: Pt is a 73 y.o. male with medical history significant of alcohol abuse, Wernicke Korsakoff syndrome, stroke, nonverbal at the baseline(?), HTN, HLD, DM, PVD, dCHF, depression with anxiety, s/p of bilateral BKA, who presents with tachycardia.  Per report, pt was noted to be tachycardic and less interactive in facility.  His heart rate was initially 142, which improved to 110s after giving IV fluid in ED.  Per ED physician, his mental status has improved and back to baseline after giving IV fluid. Patient has tachypnea with RR 38, no oxygen desaturation, 2 L oxygen  was started in ED for comfort reason.  CXR: Minimal peripheral opacity at the left lung base, atelectasis versus  pneumonia.  Pt resides at [facility]. Recent MBSS (OP) 07/2023 - see results in Imaging. A Pureed diet and thin liquids recommended then d/t Dysphagia noted.      SLP Plan  Continue with current plan of care          Recommendations  Diet recommendations: Dysphagia 1 (puree);Nectar-thick liquid Liquids provided via: Teaspoon Medication Administration: Crushed with puree Supervision: Staff to assist with self feeding;Full supervision/cueing for compensatory strategies Compensations: Minimize environmental distractions;Slow rate;Small sips/bites;Lingual sweep for clearance of pocketing;Multiple dry swallows after each bite/sip;Follow solids with liquid Postural Changes and/or Swallow Maneuvers: Out of bed for meals;Seated upright 90 degrees;Upright 30-60 min after meal                 (Palliative Care; Dietician) Oral care BID;Oral care before and after PO;Staff/trained caregiver to provide oral care   Frequent or constant Supervision/Assistance Dysphagia, oropharyngeal phase (R13.12) (baseline Cogntiive decline; dependency w/ feeding)     Continue with current plan of care      Maxwell Portugal, MS, CCC-SLP Speech Language Pathologist Rehab Services; [facility] [place] [phone] (ascom) Maxwell Webb  08/08/2023, 4:09 PM

## 2023-08-08 NOTE — Consult Note (Signed)
 PHARMACY CONSULT NOTE - FOLLOW UP  Pharmacy Consult for Electrolyte Monitoring and Replacement   Recent Labs: Potassium (mmol/L)  Date Value  08/08/2023 3.3 (L)   Magnesium (mg/dL)  Date Value  92/76/7974 2.0   Calcium  (mg/dL)  Date Value  92/75/7974 8.1 (L)   Albumin (g/dL)  Date Value  92/78/7974 3.1 (L)   Sodium (mmol/L)  Date Value  08/08/2023 149 (H)     Assessment: 73 y.o. male with medical history significant of alcohol abuse, Wernicke Korsakoff syndrome, stroke, nonverbal at the baseline, HTN, HLD, DM, PVD, dCHF, depression with anxiety, s/p of bilateral BKA, who presents with tachycardia.   Pt received six Kcl runs 7/23 and no improvement was seen in potassium. Pt is currently on D5 with Kcl 20 mEq running at 100 ml/hr.   Pt is not taking anything by mouth currently.   Goal of Therapy:  WNL  Plan:  Kcl 10 mEq x 6.  F/u with BMP @1800 .    Cathaleen GORMAN Blanch ,PharmD Clinical Pharmacist 08/08/2023 7:48 AM

## 2023-08-08 NOTE — Progress Notes (Signed)
 Palliative Care Progress Note, Assessment & Plan   Patient Name: Maxwell Webb       Date: 08/08/2023 DOB: 06-14-1950  Age: 73 y.o. MRN#: [id number] Attending Physician: Tobie Calix, MD Primary Care Physician: [facility] Admit Date: 08/05/2023  Subjective: Patient is lying in bed, leaning to his right.  He is awake and alert.  He tracks me with his eyes.  He makes no vocalizations other than answering yes x 2.  He is unable to engage appropriately in medical decision making, symptom assessment, or goals of care discussions independently at this time.  HPI: 73 y.o. male  with past medical history of EtOH abuse, Warnicke Korsakoff syndrome, stroke, HTN, HLD, type 2 diabetes, PVD, diastolic CHF, depression, anxiety, s/p bilateral BKA, and nonverbal at baseline admitted from [facility] on 08/05/2023 with tachycardia and tachypnea.   Patient is being treated for severe sepsis due to possible HCAP versus aspiration pneumonia, dehydration, hypernatremia, and AKI.   Given patient's overall poor prognosis, PMT was consulted and she worked with patient and his legal guardian to address code status and boundaries/goals of care.  Summary of counseling/coordination of care: Extensive chart review completed prior to meeting patient including labs, vital signs, imaging, progress notes, orders, and available advanced directive documents from current and previous encounters.   After reviewing the patient's chart and assessing the patient at bedside, I spoke with patient in regards to symptom management and goals of care.   Symptoms attempted to be assessed.  Patient endorses that he is not in discomfort or pain at the moment.  Nonverbal signs of pain such as groaning, moaning, fidgeting, or brow  furrowing not noted.  No adjustment to [facility] needed.  RN at bedside during my visit.  Counseled with RN in regards to patient's behavior in the ED this morning.  She endorses he has been lethargic and that his current state is the most awake he has been.  Outlined hospitalization and course of treatment for patient.  Discussed use of IV fluids to support dehydration.  Additionally, discussed with patient and RN that plans to continue to assess his p.o. intake will continue with SLP support.  As stated above, patient is a full to participate in discussions at this time.  Patient CSS legal guardian Maxwell Webb has been.  Patient with patient's attending and is in agreement to continue with current care for the next 1 to 2 days to see how he responds to hydration and p.o. intake.  PMT will continue to follow and support.  Physical Exam Vitals reviewed.  Constitutional:      General: He is not in acute distress.    Appearance: He is normal weight.  HENT:     Head: Normocephalic.     Nose: Nose normal.     Mouth/Throat:     Mouth: Mucous membranes are moist.  Eyes:     Pupils: Pupils are equal, round, and reactive to light.  Cardiovascular:     Rate and Rhythm: Normal rate.  Pulmonary:     Effort: Pulmonary effort is normal.  Abdominal:     Palpations: Abdomen is soft.  Neurological:     Mental Status: He is alert.  Comments: Spoke yes X2, no other vocalizations  Psychiatric:        Behavior: Behavior normal.             Total Time 25 minutes   Time spent includes: Detailed review of medical records (labs, imaging, vital signs), medically appropriate exam (mental status, respiratory, cardiac, skin), discussed with treatment team, counseling and educating patient, family and staff, documenting clinical information, medication management and coordination of care.  Maxwell L. Arvid, DNP, FNP-BC Palliative Medicine Team

## 2023-08-08 NOTE — Congregational Nurse Program (Deleted)
 Palliative Care Progress Note, Assessment & Plan   Patient Name: Maxwell Webb       Date: 08/08/2023 DOB: 06-14-1950  Age: 73 y.o. MRN#: [id number] Attending Physician: Tobie Calix, MD Primary Care Physician: [facility] Admit Date: 08/05/2023  Subjective: Patient is lying in bed, leaning to his right.  He is awake and alert.  He tracks me with his eyes.  He makes no vocalizations other than answering yes x 2.  He is unable to engage appropriately in medical decision making, symptom assessment, or goals of care discussions independently at this time.  HPI: 73 y.o. male  with past medical history of EtOH abuse, Warnicke Korsakoff syndrome, stroke, HTN, HLD, type 2 diabetes, PVD, diastolic CHF, depression, anxiety, s/p bilateral BKA, and nonverbal at baseline admitted from [facility] on 08/05/2023 with tachycardia and tachypnea.   Patient is being treated for severe sepsis due to possible HCAP versus aspiration pneumonia, dehydration, hypernatremia, and AKI.   Given patient's overall poor prognosis, PMT was consulted and she worked with patient and his legal guardian to address code status and boundaries/goals of care.  Summary of counseling/coordination of care: Extensive chart review completed prior to meeting patient including labs, vital signs, imaging, progress notes, orders, and available advanced directive documents from current and previous encounters.   After reviewing the patient's chart and assessing the patient at bedside, I spoke with patient in regards to symptom management and goals of care.   Symptoms attempted to be assessed.  Patient endorses that he is not in discomfort or pain at the moment.  Nonverbal signs of pain such as groaning, moaning, fidgeting, or brow  furrowing not noted.  No adjustment to [facility] needed.  RN at bedside during my visit.  Counseled with RN in regards to patient's behavior in the ED this morning.  She endorses he has been lethargic and that his current state is the most awake he has been.  Outlined hospitalization and course of treatment for patient.  Discussed use of IV fluids to support dehydration.  Additionally, discussed with patient and RN that plans to continue to assess his p.o. intake will continue with SLP support.  As stated above, patient is a full to participate in discussions at this time.  Patient CSS legal guardian Maxwell Webb has been.  Patient with patient's attending and is in agreement to continue with current care for the next 1 to 2 days to see how he responds to hydration and p.o. intake.  PMT will continue to follow and support.  Physical Exam Vitals reviewed.  Constitutional:      General: He is not in acute distress.    Appearance: He is normal weight.  HENT:     Head: Normocephalic.     Nose: Nose normal.     Mouth/Throat:     Mouth: Mucous membranes are moist.  Eyes:     Pupils: Pupils are equal, round, and reactive to light.  Cardiovascular:     Rate and Rhythm: Normal rate.  Pulmonary:     Effort: Pulmonary effort is normal.  Abdominal:     Palpations: Abdomen is soft.  Neurological:     Mental Status: He is alert.  Comments: Spoke yes X2, no other vocalizations  Psychiatric:        Behavior: Behavior normal.             Total Time 25 minutes   Time spent includes: Detailed review of medical records (labs, imaging, vital signs), medically appropriate exam (mental status, respiratory, cardiac, skin), discussed with treatment team, counseling and educating patient, family and staff, documenting clinical information, medication management and coordination of care.  [name], DNP, FNP-BC Palliative Medicine Team

## 2023-08-08 NOTE — Progress Notes (Signed)
 [facility]  - [place] at [facility]   PATIENT NAME: Maxwell Webb    MR#:  [id number]  DATE OF BIRTH:  [birth year]  SUBJECTIVE:  patient more perked up today. Did answer a couple basic questions. Discussed with speech therapy to try swallow eval.   VITALS:  Blood pressure 101/85, pulse 66, temperature 98.6 F (37 C), resp. rate 16, height 5' 7 (1.702 m), weight 83.5 kg, SpO2 100%.  PHYSICAL EXAMINATION:  limited exam due to patient's baseline mental status GENERAL:  73 y.o.-year-old patient with no acute distress. Poor Oral hygiene LUNGS: Normal breath sounds bilaterally CARDIOVASCULAR: S1, S2 normal. No murmur ABDOMEN: Soft, nontender, nondistended. NEUROLOGIC: mentation waxing and waning moves extremities spontaneously  SKIN:  Left buttock with Stage 2 pressure injury, red and moist Sacrum and across bilat buttocks dark red-purple Deep tissue pressure injury, surrounded by red moist macerated moisture associated skin damage. Deep tissue pressure injuries are high risk to evolved into full thickness skin loss within 7-10 days. Pressure Injury POA: Yes   LABORATORY PANEL:  CBC Recent Labs  Lab 08/06/23 0506  WBC 14.1*  HGB 14.0  HCT 44.3  PLT 191    Chemistries  Recent Labs  Lab 08/05/23 1645 08/06/23 0506 08/07/23 0249 08/08/23 0427  NA 148*   < > 149* 149*  K 3.9   < > 3.2* 3.3*  CL 108   < > 118* 117*  CO2 22   < > 20* 23  GLUCOSE 158*   < > 92 104*  BUN 33*   < > 32* 26*  CREATININE 1.60*   < > 1.00 0.60*  CALCIUM  9.6   < > 8.0* 8.1*  MG  --   --  2.0  --   AST 27  --   --   --   ALT 9  --   --   --   ALKPHOS 68  --   --   --   BILITOT 0.5  --   --   --    < > = values in this interval not displayed.   Cardiac Enzymes No results for input(s): TROPONINI in the last 168 hours. RADIOLOGY:  No results found.   Assessment and Plan  Maxwell Webb is a 73 y.o. male with medical history significant of alcohol abuse, Wernicke Korsakoff  syndrome, stroke, nonverbal at the baseline, HTN, HLD, DM, PVD, dCHF, depression with anxiety, s/p of bilateral BKA, who presents with tachycardia.    Severe sepsis due to possible HCAP (healthcare-associated pneumonia): Patient meets critical for severe sepsis with WBC 20.7, heart rate up to 140, RR 38, lactic acid 5.2, procalcitonin 0.32.  -- Chest x-ray showed left basilar opacity.  Patient may have HCAP versus aspiration pneumonia. -- UA. Suggestive UTI  --IV Vancomycin  and Zosyn  (pt received 1 dose of cefepime  in ED)--BC neg. Will dc vanc if MRSA screen neg - RESP panel --> negative -Lactic 5.7--1.3 --cont iv zosyn  --MRSA neg --UC <10K insignificant growth   Dehydration   AKI (acute kidney injury) (HCC): Acidosis Severe hypernatremia due to poor PO intake secondary to issues with mentation Baseline creatinine 0.87 02/05/2019.  His creatinine is 1.60, BUN 33, GFR 46, no recent baseline creatinine available. -Pending UA.  - IV fluid as above - Avoid using renal toxic medications. --sodium 147--149--149 -- start IV D5 water with potassium -- received bicarb drip   Chronic diastolic CHF (congestive heart failure) (HCC): 2D echo on 01/04/2016 showed EF of 55 to  60% with grade 1 diastolic dysfunction.  BNP is elevated 382, but no leg edema or JVD.  Does not seem to have CHF exacerbation. -Hold diuretics due to severe sepsis   HTN (hypertension): -IV hydralazine  as needed - Hold metoprolol  due to severe sepsis and high risk of developing hypotension   HLD (hyperlipidemia) -Lipitor--not taking po yet   Diabetes mellitus without complication (HCC): Recent A1c 6.2, well-controlled.  Home med metformin -SSI   Stroke (HCC) -Lipitor   Depression with anxiety -po meds on hold   S/P bilateral BKA (below knee amputation) (HCC) - Fall precaution   H/o Wernicki's/Korsakoff syndrome failure to thrive/deconditioning difficulty swallowing --bed bound and not much communicative --  patient seen by speech therapist. Very high risk for aspiration. Not tolerating oral feeds.  --ST to try po trials depending on his mentation  Palliative care consultation appreciated. DSS CSW has requested NOT to share any info with pt's family. Nursing and Charge made aware   Procedures: none Family communication :legal guardian Candace Consults :Palliative care CODE STATUS: FULL DVT Prophylaxis :enoxaparin  Level of care: Progressive Status is: Inpatient Remains inpatient appropriate because: sepsis, failure to thrive, AK I, elevated sodium   Addendum on 08/07/23  d/w DSS legal guardian Candace Gobble on the phone--they are OK with DNR/DNI. I mentioned to her that will hydrate for a 1-2 days and see if he perks up to take oral diet--if not then we should consider Hospice/comfort care--she is agreeable.   TOTAL TIME TAKING CARE OF THIS PATIENT:35 minutes.  >50% time spent on counselling and coordination of care  Note: This dictation was prepared with Dragon dictation along with smaller phrase technology. Any transcriptional errors that result from this process are unintentional.  [name] M.D    [facility]   CC: Primary care physician; [facility]

## 2023-08-09 DIAGNOSIS — E860 Dehydration: Secondary | ICD-10-CM | POA: Diagnosis not present

## 2023-08-09 DIAGNOSIS — J189 Pneumonia, unspecified organism: Secondary | ICD-10-CM | POA: Diagnosis not present

## 2023-08-09 DIAGNOSIS — E119 Type 2 diabetes mellitus without complications: Secondary | ICD-10-CM | POA: Diagnosis not present

## 2023-08-09 DIAGNOSIS — A419 Sepsis, unspecified organism: Secondary | ICD-10-CM | POA: Diagnosis not present

## 2023-08-09 LAB — BASIC METABOLIC PANEL WITH GFR
Anion gap: 6 (ref 5–15)
BUN: 20 mg/dL (ref 8–23)
CO2: 24 mmol/L (ref 22–32)
Calcium: 8.1 mg/dL — ABNORMAL LOW (ref 8.9–10.3)
Chloride: 117 mmol/L — ABNORMAL HIGH (ref 98–111)
Creatinine, Ser: 0.72 mg/dL (ref 0.61–1.24)
GFR, Estimated: >60 mL/min (ref >60)
Glucose, Bld: 125 mg/dL — ABNORMAL HIGH (ref 70–99)
Potassium: 3.5 mmol/L (ref 3.5–5.1)
Sodium: 147 mmol/L — ABNORMAL HIGH (ref 135–145)

## 2023-08-09 LAB — LEGIONELLA PNEUMOPHILA SEROGP 1 UR AG: L. pneumophila Serogp 1 Ur Ag: NEGATIVE

## 2023-08-09 LAB — MAGNESIUM: Magnesium: 1.9 mg/dL (ref 1.7–2.4)

## 2023-08-09 LAB — GLUCOSE, CAPILLARY: Glucose-Capillary: 131 mg/dL — ABNORMAL HIGH (ref 70–99)

## 2023-08-09 MED ORDER — ENSURE PLUS HIGH PROTEIN PO LIQD
237.0000 mL | Freq: Two times a day (BID) | ORAL | Status: DC
  Administered 2023-08-11: 237 mL via ORAL

## 2023-08-09 MED ORDER — POTASSIUM CHLORIDE 20 MEQ PO PACK
40.0000 meq | PACK | Freq: Once | ORAL | Status: AC
  Administered 2023-08-09: 40 meq via ORAL
  Filled 2023-08-09: qty 2

## 2023-08-09 MED ORDER — POTASSIUM CHLORIDE 10 MEQ/100ML IV SOLN
10.0000 meq | INTRAVENOUS | Status: DC
  Filled 2023-08-09 (×2): qty 100

## 2023-08-09 NOTE — Progress Notes (Signed)
 [facility]  - [place] at [facility]   PATIENT NAME: Maxwell Webb    MR#:  [id number]  DATE OF BIRTH:  [birth year]  SUBJECTIVE:  patient more perked up today. Did answer a couple basic questions. Per RN ate most of his lunch yday Will cont to see how he does today with po intake   VITALS:  Blood pressure (!) 118/96, pulse 90, temperature 98.3 F (36.8 C), temperature source Oral, resp. rate 14, height 5' 7 (1.702 m), weight 84.5 kg, SpO2 100%.  PHYSICAL EXAMINATION:  limited exam due to patient's baseline mental status GENERAL:  73 y.o.-year-old patient with no acute distress. Poor Oral hygiene LUNGS: Normal breath sounds bilaterally CARDIOVASCULAR: S1, S2 normal. No murmur ABDOMEN: Soft, nontender, nondistended. NEUROLOGIC: mentation waxing and waning moves extremities spontaneously  SKIN:  Left buttock with Stage 2 pressure injury, red and moist Sacrum and across bilat buttocks dark red-purple Deep tissue pressure injury, surrounded by red moist macerated moisture associated skin damage. Deep tissue pressure injuries are high risk to evolved into full thickness skin loss within 7-10 days. Pressure Injury POA: Yes   LABORATORY PANEL:  CBC Recent Labs  Lab 08/06/23 0506  WBC 14.1*  HGB 14.0  HCT 44.3  PLT 191    Chemistries  Recent Labs  Lab 08/05/23 1645 08/06/23 0506 08/09/23 0550  NA 148*   < > 147*  K 3.9   < > 3.5  CL 108   < > 117*  CO2 22   < > 24  GLUCOSE 158*   < > 125*  BUN 33*   < > 20  CREATININE 1.60*   < > 0.72  CALCIUM  9.6   < > 8.1*  MG  --    < > 1.9  AST 27  --   --   ALT 9  --   --   ALKPHOS 68  --   --   BILITOT 0.5  --   --    < > = values in this interval not displayed.   Cardiac Enzymes No results for input(s): TROPONINI in the last 168 hours. RADIOLOGY:  No results found.   Assessment and Plan  Oseph Imburgia is a 73 y.o. male with medical history significant of alcohol abuse, Wernicke Korsakoff syndrome,  stroke, nonverbal at the baseline, HTN, HLD, DM, PVD, dCHF, depression with anxiety, s/p of bilateral BKA, who presents with tachycardia.    Severe sepsis due to possible HCAP (healthcare-associated pneumonia): Patient meets critical for severe sepsis with WBC 20.7, heart rate up to 140, RR 38, lactic acid 5.2, procalcitonin 0.32.  -- Chest x-ray showed left basilar opacity.  Patient may have HCAP versus aspiration pneumonia. -- UA. Suggestive UTI  --IV Vancomycin  and Zosyn  (pt received 1 dose of cefepime  in ED)--BC neg. Will dc vanc since MRSA screen neg --IV zosyn  for 5 days - RESP panel --> negative -Lactic 5.7--1.3 --cont iv zosyn  --MRSA neg --UC <10K insignificant growth   Dehydration   AKI (acute kidney injury) (HCC): Acidosis Severe hypernatremia due to poor PO intake secondary to issues with mentation Baseline creatinine 0.87 02/05/2019.  His creatinine is 1.60, BUN 33, GFR 46, no recent baseline creatinine available. -Pending UA.  - IV fluid as above - Avoid using renal toxic medications. --sodium 147--149--149--147--BMP tomorrow --received IVF -- received bicarb drip   Chronic diastolic CHF (congestive heart failure) (HCC): 2D echo on 01/04/2016 showed EF of 55 to 60% with grade 1 diastolic dysfunction.  BNP  is elevated 382, but no leg edema or JVD.  Does not seem to have CHF exacerbation. -Hold diuretics due to severe sepsis   HTN (hypertension): -IV hydralazine  as needed - Hold metoprolol   since bP stable   HLD (hyperlipidemia) -Lipitor   Diabetes mellitus without complication (HCC): Recent A1c 6.2, well-controlled.  Home med metformin--on hold due to stable sugars. -SSI   Stroke (HCC) -Lipitor   Depression with anxiety -po meds on hold   S/P bilateral BKA (below knee amputation) (HCC) - Fall precaution   H/o Wernicki's/Korsakoff syndrome failure to thrive/deconditioning difficulty swallowing --bed bound and not much communicative -- patient seen by  speech therapist. Very high risk for aspiration. --pt has started consuming po pureed diet. Will give anohter day to see if it continues to improve   Palliative care consultation appreciated. DSS CSW has requested NOT to share any info with pt's family.   7/25--d/w DSS legal guardian Candice--updated about pt. Plan is to [facility] on Saturday and palliative care to follow. Patient is at a high risk for readmission due to his multiple comorbidities. Patient is DNR DNI.  Procedures: none Family communication :legal guardian Candace  on 7/25 Consults :Palliative care CODE STATUS: DNR DVT Prophylaxis :enoxaparin  Level of care: Med-Surg Status is: Inpatient Remains inpatient appropriate because: sepsis, failure to thrive, AK I, elevated sodium    TOTAL TIME TAKING CARE OF THIS PATIENT:35 minutes.  >50% time spent on counselling and coordination of care  Note: This dictation was prepared with Dragon dictation along with smaller phrase technology. Any transcriptional errors that result from this process are unintentional.  [name] M.D    [facility]   CC: Primary care physician; [facility]

## 2023-08-09 NOTE — Progress Notes (Signed)
 Palliative Care Progress Note, Assessment & Plan   Patient Name: Siddhanth Denk       Date: 08/09/2023 DOB: 03-Jan-1951  Age: 73 y.o. MRN#: [id number] Attending Physician: Tobie Calix, MD Primary Care Physician: [facility] Admit Date: 08/05/2023  Subjective: Patient lying in bed, resting on his right side.  He is asleep but awakens to my presence.  He opens his eyes to verbal stimuli.  He tracks me with his eyes.  However, he makes no vocalizations during my visit.  No family or friends present during my visit.  HPI: 73 y.o. male  with past medical history of EtOH abuse, Warnicke Korsakoff syndrome, stroke, HTN, HLD, type 2 diabetes, PVD, diastolic CHF, depression, anxiety, s/p bilateral BKA, and nonverbal at baseline admitted from [facility] on 08/05/2023 with tachycardia and tachypnea.   Patient is being treated for severe sepsis due to possible HCAP versus aspiration pneumonia, dehydration, hypernatremia, and AKI.   Given patient's overall poor prognosis, PMT was consulted and she worked with patient and his legal guardian to address code status and boundaries/goals of care.  Summary of counseling/coordination of care: Extensive chart review completed prior to meeting patient including labs, vital signs, imaging, progress notes, orders, and available advanced directive documents from current and previous encounters.   After reviewing the patient's chart and assessing the patient at bedside, I spoke with patient in regards to symptom management and goals of care.   Patient confirmed that he is not in pain.  He is able to say yes or no appropriately to questions.  He denies headache, chest pain, N/B, and other acute issues at this time.  No adjustment to [facility] needed.  Discussed that plan  remains to continue supporting patient with hydration and p.o. intake transferred back to the resources tomorrow.  Patient is stating agreement.  Discussed outpatient palliative services and that this additional layer of support will continue to follow with them at this facility.  He did not improve.  Reviewed.  Symptom burden is low.  Goals are clear.  PMT will step back from daily visits and monitor the patient peripherally.  Please re-engage with PMT if goals change, at patient/family's request, or if patient's health deteriorates during hospitalization.    Physical Exam Vitals reviewed.  Constitutional:      Comments: thin  HENT:     Mouth/Throat:     Mouth: Mucous membranes are moist.  Eyes:     Pupils: Pupils are equal, round, and reactive to light.  Pulmonary:     Effort: Pulmonary effort is normal.  Abdominal:     Palpations: Abdomen is soft.  Skin:    General: Skin is warm and dry.  Neurological:     Mental Status: He is alert.     Comments: nonverbal  Psychiatric:        Behavior: Behavior normal.        Judgment: Judgment normal.             Total Time 25 minutes   Time spent includes: Detailed review of medical records (labs, imaging, vital signs), medically appropriate exam (mental status, respiratory, cardiac, skin), discussed with treatment team, counseling and educating patient, family and staff,  documenting clinical information, medication management and coordination of care.  [name], DNP, FNP-BC Palliative Medicine Team

## 2023-08-09 NOTE — Progress Notes (Addendum)
 Speech Language Pathology Treatment: Dysphagia  Patient Details Name: Maxwell Webb MRN: [id number] DOB: 10/20/1950 Today's Date: 08/09/2023 Time: 1030-1100 SLP Time Calculation (min) (ACUTE ONLY): 30 min  Assessment / Plan / Recommendation Clinical Impression  Pt seen for ongoing assessment of swallowing; safety for oral intake. Pt alert/awake but seemed to exhibit min less engagement/energy than at yesterday's session. NSG reported same. Slow Responses overall. He responded to the visual presentation of swabs/spoon to mouth/lips. Oral care provided. On RA; afebrile.     Pt has a BASELINE dx of oropharyngeal phase Dysphagia per MBSS on 08/01/2023: moderate oropharyngeal dysphagia. Pt demonstrated oral holding and prolonged oral prep with both solids and purees, inconsistently with liquids. Incomplete mastication of solids. Repetition lingual motion also noted throughout study. Piecemeal swallow and/or oral residual noted. Pharyngeally, pt with mild-moderate vallecular stasis with solids in which liquid wash via straw attributed to before the swallow aspiration. Residual was trace-unremarkable with other consistencies. Inconsistently delayed swallow initiation noted. Above mentioned deficits due to lingual weakness/incoordination, reduced BOT retraction, and mistimed swallow initiation. Deficits likely exacerbated by cognitive status. Recommend a pureed diet (runnier purees preferred with additional gravies, sauces, and condiments to moisten) with thin liquids Recommend safe swallowing strategies/aspiration including, but not limited to, 1:1 assistance with feeding, avoidance of straws, watch for pharyngeal swallow and clearance of oral cavity before next bite/sip, and upright as possible for PO intake.  Unsure of his progress/status at his NH re: swallowing and oral intake since that study ~1 week ago.   Pt awake for po trials of Nectar liquids and Purees; MIN+ verbal/tactile stim by this SLP. Oral care  was completed. Po trials of Nectar liquids via tsp and purees were given, alternating the trials to aid A-P transfer/swallow/oral clearing. Oral phase deficits c/b intermittent oral holding, min bolus residue post swallow, and slow/min lingual/labial movements intermittently- suspect a fatigue factor w/ the tasks(both mental and physical). Pharyngeal phase c/b min reduced hyolaryngeal excursion via palpation at neck but improved in general      Pt appears at risk for aspiration/aspiration pneumonia but this appears reduced when following aspiration precautions and given feeding assistance/Supervision by Staff using a Dysphagia level 1(puree) w/ Nectar liquids via TSP diet. Pt is also at risk for not being able to meet his nutrition/hydration needs d/t dependency on being fed. Recommend frequent oral care when awake and before/after intake; Small/Single bites/sips w/ TIME b/t bites for oral clearing. Alternate foods/liquids to aid oral clearing. Aspiration precautions.  ST services can follow for toleration of diet and ongoing assessment to determine a least restrictive oral diet at his Facility per MD order there; education also. NSG and MD updated, agreed. Recommend Dietician f/u and Palliative Care consult for support/GOC.    HPI HPI: Pt is a 73 y.o. male with medical history significant of alcohol abuse, Wernicke Korsakoff syndrome, stroke, nonverbal at the baseline(?), HTN, HLD, DM, PVD, dCHF, depression with anxiety, s/p of bilateral BKA, who presents with tachycardia.  Per report, pt was noted to be tachycardic and less interactive in facility.  His heart rate was initially 142, which improved to 110s after giving IV fluid in ED.  Per ED physician, his mental status has improved and back to baseline after giving IV fluid. Patient has tachypnea with RR 38, no oxygen desaturation, 2 L oxygen was started in ED for comfort reason.   CXR: Minimal peripheral opacity at the left lung base, atelectasis versus   pneumonia.  Pt resides at [facility]. Recent MBSS (OP)  07/2023 - see results in Imaging. A Pureed diet and thin liquids recommended then d/t Dysphagia noted.      SLP Plan  Continue with current plan of care          Recommendations  Diet recommendations: Dysphagia 1 (puree);Nectar-thick liquid Liquids provided via: Teaspoon Medication Administration: Crushed with puree Supervision: Staff to assist with self feeding;Full supervision/cueing for compensatory strategies Compensations: Minimize environmental distractions;Slow rate;Small sips/bites;Lingual sweep for clearance of pocketing;Multiple dry swallows after each bite/sip;Follow solids with liquid Postural Changes and/or Swallow Maneuvers: Out of bed for meals;Seated upright 90 degrees;Upright 30-60 min after meal                 (Palliative Care f/u; Dietician f/u) Oral care BID;Oral care before and after PO;Staff/trained caregiver to provide oral care   Frequent or constant Supervision/Assistance Dysphagia, oropharyngeal phase (R13.12) (baseline Cogntiive decline; dependency w/ feeding)     Continue with current plan of care      [name], MS, CCC-SLP Speech Language Pathologist Rehab Services; [facility] [phone] (ascom) Makhiya Coburn  08/09/2023, 2:20 PM

## 2023-08-09 NOTE — Plan of Care (Signed)
   Problem: Education: Goal: Ability to describe self-care measures that may prevent or decrease complications (Diabetes Survival Skills Education) will improve Outcome: Not Progressing Goal: Individualized Educational Video(s) Outcome: Not Progressing   Problem: Coping: Goal: Ability to adjust to condition or change in health will improve Outcome: Not Progressing   Problem: Fluid Volume: Goal: Ability to maintain a balanced intake and output will improve Outcome: Not Progressing   Problem: Health Behavior/Discharge Planning: Goal: Ability to identify and utilize available resources and services will improve Outcome: Not Progressing Goal: Ability to manage health-related needs will improve Outcome: Not Progressing   Problem: Metabolic: Goal: Ability to maintain appropriate glucose levels will improve Outcome: Not Progressing   Problem: Nutritional: Goal: Maintenance of adequate nutrition will improve Outcome: Not Progressing Goal: Progress toward achieving an optimal weight will improve Outcome: Not Progressing   Problem: Skin Integrity: Goal: Risk for impaired skin integrity will decrease Outcome: Not Progressing   Problem: Tissue Perfusion: Goal: Adequacy of tissue perfusion will improve Outcome: Not Progressing

## 2023-08-09 NOTE — Progress Notes (Signed)
 Pt tx from 2A, Pt A&OX1 to self. BP (!) 118/96 (BP Location: Right Arm)   Pulse 90   Temp 98.3 F (36.8 C) (Oral)   Resp 14   Ht 5' 7 (1.702 m) Comment: From prior O/V note.  Pt hx of Bilateral BKA.  Wt 84.5 kg   SpO2 100%   BMI 29.18 kg/m  Pt has significant wound on tail bone. Pt not on tele or oxygen. Pt has purewick. Pt on dysphasia diet. Pt is BLE amputee. No other needs voiced at this time. Maxwell Webb 08/09/23 1:26 PM

## 2023-08-09 NOTE — Consult Note (Addendum)
 PHARMACY CONSULT NOTE - FOLLOW UP  Pharmacy Consult for Electrolyte Monitoring and Replacement   Recent Labs: Potassium (mmol/L)  Date Value  08/09/2023 3.5   Magnesium (mg/dL)  Date Value  92/74/7974 1.9   Calcium  (mg/dL)  Date Value  92/74/7974 8.1 (L)   Albumin (g/dL)  Date Value  92/78/7974 3.1 (L)   Sodium (mmol/L)  Date Value  08/09/2023 147 (H)     Assessment: 73 y.o. male with medical history significant of alcohol abuse, Wernicke Korsakoff syndrome, stroke, nonverbal at the baseline, HTN, HLD, DM, PVD, dCHF, depression with anxiety, s/p of bilateral BKA, who presents with tachycardia.   Pt received six Kcl runs 7/23 and no improvement was seen in potassium. Completed D5 with Kcl 20 mEq running at 100 ml/hr.   Seems pt is now able to take some medications PO.   Goal of Therapy:  WNL  Plan:  Kcl 40 mEq x 2.  F/u with BMP in the AM.     Cathaleen GORMAN Blanch ,PharmD Clinical Pharmacist 08/09/2023 7:15 AM

## 2023-08-09 NOTE — Plan of Care (Signed)
  Problem: Fluid Volume: Goal: Ability to maintain a balanced intake and output will improve Outcome: Progressing   Problem: Nutritional: Goal: Maintenance of adequate nutrition will improve Outcome: Progressing   Problem: Safety: Goal: Ability to remain free from injury will improve Outcome: Progressing

## 2023-08-09 NOTE — TOC Progression Note (Addendum)
 Transition of Care [facility]) - Progression Note    Patient Details  Name: Sevyn Paredez MRN: [id number] Date of Birth: Sep 24, 1950  [facility] CM/SW Contact  Tomasa JAYSON Childes, RN Phone Number: 08/09/2023, 9:01 AM  Clinical Narrative:    Per MD patient likely to meet medically readiness tomorrow.   Attempt to reach [facility]. No answer. Left a message.   12:30pm Per [facility], patient can return to facility tomorrow.    3:55pm Attempt to reach [employer] DSS @ [phone] . No answer. Left a message regarding discharge. Provided covering TOC  for Saturday's contact number   4:26pm Spoke with Sari from [facility], [facility] tomorrow.                     Expected Discharge Plan and Services                                               Social Drivers of Health (SDOH) Interventions SDOH Screenings   Food Insecurity: Patient Unable To Answer (08/06/2023)  Housing: Unknown (08/06/2023)  Transportation Needs: Patient Unable To Answer (08/06/2023)  Utilities: Patient Unable To Answer (08/06/2023)  Social Connections: Patient Unable To Answer (08/06/2023)  Tobacco Use: High Risk (08/05/2023)    Readmission Risk Interventions     No data to display

## 2023-08-10 DIAGNOSIS — E870 Hyperosmolality and hypernatremia: Secondary | ICD-10-CM | POA: Diagnosis not present

## 2023-08-10 DIAGNOSIS — L89322 Pressure ulcer of left buttock, stage 2: Secondary | ICD-10-CM | POA: Insufficient documentation

## 2023-08-10 DIAGNOSIS — R5383 Other fatigue: Secondary | ICD-10-CM

## 2023-08-10 DIAGNOSIS — A419 Sepsis, unspecified organism: Secondary | ICD-10-CM | POA: Diagnosis not present

## 2023-08-10 DIAGNOSIS — N179 Acute kidney failure, unspecified: Secondary | ICD-10-CM | POA: Diagnosis not present

## 2023-08-10 LAB — BASIC METABOLIC PANEL WITH GFR
Anion gap: 5 (ref 5–15)
BUN: 16 mg/dL (ref 8–23)
CO2: 21 mmol/L — ABNORMAL LOW (ref 22–32)
Calcium: 8.4 mg/dL — ABNORMAL LOW (ref 8.9–10.3)
Chloride: 119 mmol/L — ABNORMAL HIGH (ref 98–111)
Creatinine, Ser: 0.6 mg/dL — ABNORMAL LOW (ref 0.61–1.24)
GFR, Estimated: >60 mL/min (ref >60)
Glucose, Bld: 109 mg/dL — ABNORMAL HIGH (ref 70–99)
Potassium: 3.8 mmol/L (ref 3.5–5.1)
Sodium: 145 mmol/L (ref 135–145)

## 2023-08-10 LAB — CULTURE, BLOOD (ROUTINE X 2)
Culture: NO GROWTH
Culture: NO GROWTH

## 2023-08-10 LAB — MAGNESIUM: Magnesium: 2 mg/dL (ref 1.7–2.4)

## 2023-08-10 LAB — GLUCOSE, CAPILLARY: Glucose-Capillary: 90 mg/dL (ref 70–99)

## 2023-08-10 MED ORDER — KCL IN DEXTROSE-NACL 20-5-0.9 MEQ/L-%-% IV SOLN
INTRAVENOUS | Status: AC
  Filled 2023-08-10 (×2): qty 1000

## 2023-08-10 MED ORDER — POTASSIUM CHLORIDE 10 MEQ/100ML IV SOLN
10.0000 meq | INTRAVENOUS | Status: AC
  Administered 2023-08-10 (×3): 10 meq via INTRAVENOUS
  Filled 2023-08-10 (×3): qty 100

## 2023-08-10 MED ORDER — POTASSIUM CHLORIDE 20 MEQ PO PACK
40.0000 meq | PACK | Freq: Three times a day (TID) | ORAL | Status: DC
  Filled 2023-08-10: qty 2

## 2023-08-10 NOTE — Progress Notes (Signed)
 Progress Note   Patient: Maxwell Webb FMW:[id number] DOB: Feb 06, 1950 DOA: 08/05/2023     5 DOS: the patient was seen and examined on 08/10/2023   Brief hospital course: 73 y.o. male with medical history significant of alcohol abuse, Warnicke Korsakoff syndrome, stroke, nonverbal at the baseline, HTN, HLD, DM, PVD, dCHF, depression with anxiety, s/p of bilateral BKA, who presents with tachycardia.   Patient is nonverbal at baseline and is unable to provide medical history, therefore, most of the history is obtained by discussing the case with ED physician, per EMS report, and with the nursing staff.   Per report, pt was noted to be tachycardic and less interactive in facility.  His heart rate was initially 142, which improved to 110s after giving IV fluid in ED.  Per ED physician, his mental status has improved and back to baseline after giving IV fluid.  When I saw patient in ED, patient very slowly and minimally waves his hand, indicating that he probably understands our conversation.  Patient has tachypnea with RR 38, no oxygen desaturation, 2 L oxygen was started in ED for comfort reason.  No active nausea, vomiting, diarrhea noted.  No active respiratory distress, cough noted.  Does not seem to have chest pain or abdominal pain.  Not sure if patient has symptoms of UTI.     Data reviewed independently and ED Course: pt was found to have WBC 20.7, AKI, lactic acid of 5.2, procalcitonin 0.32, INR 1.2, sodium 148,  pending UA. Temperature 98.6, blood pressure 129/85, heart rate 142 --> 110s, RR 38, oxygen sat 98% on 2 L oxygen.  Chest x-ray showed minimal left basilar opacity.  Patient is admitted to PCU as inpatient.  7/26.  Patient lethargic this morning.  Did not eat breakfast and did not eat much for lunch.  Nursing staff only trouble giving him meds.  Assessment and Plan: * Severe sepsis (HCC) Present on admission.  Left basilar pneumonia, tachycardia, leukocytosis tachypnea and lactic  acidosis of 5.2.  On IV Zosyn .  Lethargy Overall prognosis poor.  Hypernatremia Sodium 148 on presentation and last sodium in the normal range.  Likely will get worse over time with poor appetite.  Overall prognosis poor.  AKI (acute kidney injury) (HCC) Creatinine 1.6 on presentation and down to 0.6.  Chronic diastolic CHF (congestive heart failure) (HCC) Last EF 55%.  With poor appetite would not give any diuretic.  HTN (hypertension) Blood pressure normal off metoprolol   HLD (hyperlipidemia) Lipitor if able to take  Diabetes mellitus without complication (HCC) Hemoglobin A1c 6.2.  Hold metformin  Stroke (HCC) On Lipitor  Depression with anxiety Continue Depakote   Decubitus ulcer of left buttock, stage 2 (HCC) Present on admission, local wound care.     Subjective: Patient opens eyes when I came in this morning and then close them.  Did not speak with me.  Did not eat breakfast.  Lethargic this morning.  Admitted with sepsis and pneumonia.  Physical Exam: Vitals:   08/09/23 2022 08/10/23 0423 08/10/23 0540 08/10/23 0925  BP: (!) 124/99 (!) 118/94  (!) 113/91  Pulse: 100 88  77  Resp: 18 17  20   Temp: 98.3 F (36.8 C) 98.3 F (36.8 C)  98.4 F (36.9 C)  TempSrc:      SpO2: 97% 96%  94%  Weight:   81.8 kg   Height:       Physical Exam HENT:     Head: Normocephalic.     Mouth/Throat:  Pharynx: No oropharyngeal exudate.  Eyes:     General: Lids are normal.     Conjunctiva/sclera: Conjunctivae normal.  Cardiovascular:     Rate and Rhythm: Normal rate and regular rhythm.     Heart sounds: Normal heart sounds, S1 normal and S2 normal.  Pulmonary:     Breath sounds: No decreased breath sounds, wheezing, rhonchi or rales.  Abdominal:     Palpations: Abdomen is soft.     Tenderness: There is no abdominal tenderness.  Musculoskeletal:     Right Lower Extremity: Right leg is amputated below knee.     Left Lower Extremity: Left leg is amputated below  knee.  Skin:    General: Skin is warm.     Findings: No rash.  Neurological:     Mental Status: He is lethargic.     Data Reviewed: Creatinine 0.6  Disposition: Status is: Inpatient Remains inpatient appropriate because: With patient being lethargic most in the morning I do not want to send back to the rehab without knowing that he is able to eat.  Planned Discharge Destination: Long-term care    Time spent: 28 minutes  Author: Charlie Patterson, MD 08/10/2023 2:19 PM  For on call review [email or web address].

## 2023-08-10 NOTE — Assessment & Plan Note (Signed)
 Last EF 55%.  With poor appetite would not give any diuretic.

## 2023-08-10 NOTE — Assessment & Plan Note (Signed)
 Present on admission.  Left basilar pneumonia, tachycardia, leukocytosis tachypnea and lactic acidosis of 5.2.  On IV Zosyn .

## 2023-08-10 NOTE — Assessment & Plan Note (Addendum)
 Sodium 148 on presentation and last sodium in the normal range.  Likely will get worse over time with poor appetite.  Overall prognosis poor.

## 2023-08-10 NOTE — Assessment & Plan Note (Signed)
 Lipitor if able to take

## 2023-08-10 NOTE — Hospital Course (Signed)
 73 y.o. male with medical history significant of alcohol abuse, Warnicke Korsakoff syndrome, stroke, nonverbal at the baseline, HTN, HLD, DM, PVD, dCHF, depression with anxiety, s/p of bilateral BKA, who presents with tachycardia.   Patient is nonverbal at baseline and is unable to provide medical history, therefore, most of the history is obtained by discussing the case with ED physician, per EMS report, and with the nursing staff.   Per report, pt was noted to be tachycardic and less interactive in facility.  His heart rate was initially 142, which improved to 110s after giving IV fluid in ED.  Per ED physician, his mental status has improved and back to baseline after giving IV fluid.  When I saw patient in ED, patient very slowly and minimally waves his hand, indicating that he probably understands our conversation.  Patient has tachypnea with RR 38, no oxygen desaturation, 2 L oxygen was started in ED for comfort reason.  No active nausea, vomiting, diarrhea noted.  No active respiratory distress, cough noted.  Does not seem to have chest pain or abdominal pain.  Not sure if patient has symptoms of UTI.     Data reviewed independently and ED Course: pt was found to have WBC 20.7, AKI, lactic acid of 5.2, procalcitonin 0.32, INR 1.2, sodium 148,  pending UA. Temperature 98.6, blood pressure 129/85, heart rate 142 --> 110s, RR 38, oxygen sat 98% on 2 L oxygen.  Chest x-ray showed minimal left basilar opacity.  Patient is admitted to PCU as inpatient.  7/26.  Patient lethargic this morning.  Did not eat breakfast and did not eat much for lunch.  Nursing staff only trouble giving him meds.

## 2023-08-10 NOTE — Assessment & Plan Note (Signed)
 Blood pressure normal off metoprolol 

## 2023-08-10 NOTE — Assessment & Plan Note (Signed)
 Hemoglobin A1c 6.2.  Hold metformin

## 2023-08-10 NOTE — Consult Note (Signed)
 PHARMACY CONSULT NOTE - FOLLOW UP  Pharmacy Consult for Electrolyte Monitoring and Replacement   Recent Labs: Potassium (mmol/L)  Date Value  08/10/2023 3.8   Magnesium (mg/dL)  Date Value  92/73/7974 2.0   Calcium  (mg/dL)  Date Value  92/73/7974 8.4 (L)   Albumin (g/dL)  Date Value  92/78/7974 3.1 (L)   Sodium (mmol/L)  Date Value  08/10/2023 145     Assessment: 73 y.o. male with medical history significant of alcohol abuse, Wernicke Korsakoff syndrome, stroke, nonverbal at the baseline, HTN, HLD, DM, PVD, dCHF, depression with anxiety, s/p of bilateral BKA, who presents with tachycardia.   Pt received six Kcl runs 7/23 and no improvement was seen in potassium. Completed D5 with Kcl 20 mEq running at 100 ml/hr.   Seems pt is now able to take some medications PO.   Goal of Therapy:  WNL  Plan:  K 3.8  Will order Kcl 40 mEq x 2 doses (received 2 doses on 7/25 and K only went up 0.3 points) F/u with BMP in the AM.     Suzann Allean LABOR ,PharmD Clinical Pharmacist 08/10/2023 8:03 AM

## 2023-08-10 NOTE — Assessment & Plan Note (Signed)
 Present on admission, local wound care.

## 2023-08-10 NOTE — Assessment & Plan Note (Signed)
Continue Depakote. 

## 2023-08-10 NOTE — Assessment & Plan Note (Signed)
Patient has marked muscle wasting and prominence of bony eminences Nutritionist consult

## 2023-08-10 NOTE — Assessment & Plan Note (Signed)
 Creatinine 1.6 on presentation and down to 0.6.

## 2023-08-10 NOTE — Plan of Care (Signed)
 Pt lethargic most of shift, will open eyes and follow commands. Answers name appropriately. Unable to eat breakfast and lunch due to poor swallowing. Pt reports difficulty swallowing, MD notified. BM x 1. Potassium IV given for replacement. IV abx continued.   Problem: Nutrition: Goal: Adequate nutrition will be maintained Outcome: Not Progressing   Problem: Activity: Goal: Ability to tolerate increased activity will improve Outcome: Not Progressing   Problem: Health Behavior/Discharge Planning: Goal: Ability to identify and utilize available resources and services will improve Outcome: Not Progressing   Problem: Nutritional: Goal: Maintenance of adequate nutrition will improve Outcome: Not Progressing

## 2023-08-10 NOTE — Assessment & Plan Note (Signed)
 On Lipitor

## 2023-08-11 DIAGNOSIS — R6520 Severe sepsis without septic shock: Secondary | ICD-10-CM | POA: Diagnosis not present

## 2023-08-11 DIAGNOSIS — A419 Sepsis, unspecified organism: Secondary | ICD-10-CM | POA: Diagnosis not present

## 2023-08-11 LAB — BASIC METABOLIC PANEL WITH GFR
Anion gap: 9 (ref 5–15)
BUN: 16 mg/dL (ref 8–23)
CO2: 22 mmol/L (ref 22–32)
Calcium: 8.3 mg/dL — ABNORMAL LOW (ref 8.9–10.3)
Chloride: 115 mmol/L — ABNORMAL HIGH (ref 98–111)
Creatinine, Ser: 0.72 mg/dL (ref 0.61–1.24)
GFR, Estimated: >60 mL/min (ref >60)
Glucose, Bld: 116 mg/dL — ABNORMAL HIGH (ref 70–99)
Potassium: 3.7 mmol/L (ref 3.5–5.1)
Sodium: 146 mmol/L — ABNORMAL HIGH (ref 135–145)

## 2023-08-11 MED ORDER — POTASSIUM CHLORIDE 10 MEQ/100ML IV SOLN
10.0000 meq | INTRAVENOUS | Status: AC
  Administered 2023-08-11 (×4): 10 meq via INTRAVENOUS
  Filled 2023-08-11 (×4): qty 100

## 2023-08-11 NOTE — Progress Notes (Addendum)
 Progress Note   Patient: Maxwell Webb FMW:[id number] DOB: [birth year] DOA: 08/05/2023     6 DOS: the patient was seen and examined on 08/11/2023   Brief hospital course:  73 y.o. male with medical history significant of alcohol abuse, Warnicke Korsakoff syndrome, stroke, nonverbal at the baseline, HTN, HLD, DM, PVD, dCHF, depression with anxiety, s/p of bilateral BKA, who presents with tachycardia.   Patient is nonverbal at baseline and is unable to provide medical history, therefore, most of the history is obtained by discussing the case with ED physician, per EMS report, and with the nursing staff.   Per report, pt was noted to be tachycardic and less interactive in facility.  His heart rate was initially 142, which improved to 110s after giving IV fluid in ED.  Per ED physician, his mental status has improved and back to baseline after giving IV fluid.  When I saw patient in ED, patient very slowly and minimally waves his hand, indicating that he probably understands our conversation.  Patient has tachypnea with RR 38, no oxygen desaturation, 2 L oxygen was started in ED for comfort reason.  No active nausea, vomiting, diarrhea noted.  No active respiratory distress, cough noted.  Does not seem to have chest pain or abdominal pain.  Not sure if patient has symptoms of UTI.     Data reviewed independently and ED Course: pt was found to have WBC 20.7, AKI, lactic acid of 5.2, procalcitonin 0.32, INR 1.2, sodium 148,  pending UA. Temperature 98.6, blood pressure 129/85, heart rate 142 --> 110s, RR 38, oxygen sat 98% on 2 L oxygen.  Chest x-ray showed minimal left basilar opacity.  Patient is admitted to PCU as inpatient.   7/26.  Patient lethargic this morning.  Did not eat breakfast and did not eat much for lunch.  Nursing staff only trouble giving him meds.   7/27  Continues to have poor oral intake, even with assistance with his meals, patient continues to pocket meals and has difficulty  swallowing.  Palliative care consult requested for goals of care   Assessment and Plan:  * Severe sepsis (HCC) Present on admission.  Secondary to left basilar pneumonia POA as evidenced by tachycardia, leukocytosis tachypnea and lactic acidosis of 5.2.   On IV Zosyn . Appreciate speech therapy input, swallow function evaluation done with no evidence of aspiration.  Recommends to continue dysphagia 1 diet with nectar thick liquids    Acute metabolic encephalopathy Lethargy Patient is nonverbal at baseline Overall prognosis poor.    Hypernatremia Sodium 148 on presentation and last sodium in the normal range.   Likely will get worse over time with poor oral intake. Continue dextrose  containing fluids to administer free water    AKI (acute kidney injury) (HCC) Creatinine 1.6 on presentation and down to 0.6.    Chronic diastolic CHF (congestive heart failure) (HCC) Last EF 55%. Not acutely exacerbated   HTN (hypertension) Patient is normotensive off metoprolol  Continue to monitor blood pressure closely   HLD (hyperlipidemia) Lipitor if able to take   Diabetes mellitus without complication (HCC) Hemoglobin A1c 6.2.  Hold metformin   Stroke (HCC) On Lipitor   Depression with anxiety Continue Depakote    Decubitus ulcer of left buttock, stage 2 (HCC) Present on admission, local wound care.           Subjective: Oral intake remains poor.  Lab reveals hypernatremia  Physical Exam: Vitals:   08/11/23 0440 08/11/23 0810 08/11/23 0812 08/11/23 0814  BP:  ROLLEN)  125/95  (!) 131/95  Pulse:   71 (!) 54  Resp:  19    Temp:  (!) 97.5 F (36.4 C)  97.7 F (36.5 C)  TempSrc:  Oral  Axillary  SpO2:  99% 98% 98%  Weight: 82.2 kg     Height:       HENT:     Head: Normocephalic.     Mouth/Throat:     Pharynx: No oropharyngeal exudate.  Eyes:     General: Lids are normal.     Conjunctiva/sclera: Conjunctivae normal.  Cardiovascular:     Rate and Rhythm: Normal rate  and regular rhythm.     Heart sounds: Normal heart sounds, S1 normal and S2 normal.  Pulmonary:     Breath sounds: No decreased breath sounds, wheezing, rhonchi or rales.  Abdominal:     Palpations: Abdomen is soft.     Tenderness: There is no abdominal tenderness.  Musculoskeletal:     Right Lower Extremity: Right leg is amputated below knee.     Left Lower Extremity: Left leg is amputated below knee.  Skin:    General: Skin is warm.     Findings: No rash.  Neurological:     Mental Status: He is awake but non verbal.        Data Reviewed: Sodium 146, chloride 115 Labs reviewed  Family Communication: Attempted to call legal guardian  Disposition: Status is: Inpatient Remains inpatient appropriate because: Palliative care consult for goals of care conversation  Planned Discharge Destination: Skilled nursing facility    Time spent: 40 minutes  Author: Aimee Somerset, MD 08/11/2023 1:08 PM  For on call review [email or web address].

## 2023-08-11 NOTE — Consult Note (Signed)
 PHARMACY CONSULT NOTE - FOLLOW UP  Pharmacy Consult for Electrolyte Monitoring and Replacement   Recent Labs: Potassium (mmol/L)  Date Value  08/11/2023 3.7   Magnesium (mg/dL)  Date Value  92/73/7974 2.0   Calcium  (mg/dL)  Date Value  92/72/7974 8.3 (L)   Albumin (g/dL)  Date Value  92/78/7974 3.1 (L)   Sodium (mmol/L)  Date Value  08/11/2023 146 (H)     Assessment: 73 y.o. male with medical history significant of alcohol abuse, Wernicke Korsakoff syndrome, stroke, nonverbal at the baseline, HTN, HLD, DM, PVD, dCHF, depression with anxiety, s/p of bilateral BKA, who presents with tachycardia.   Pt received six Kcl runs 7/23 and no improvement was seen in potassium. Completed D5 with Kcl 20 mEq running at 100 ml/hr.    Goal of Therapy:  WNL  Plan:  K 3.7  Will order Kcl 10 meq IV x 4 doses   (KCL po was apparently changed to IV yesterday 7/26 d/t pt being lethargic). K has been slow to respond to replacement so leaning on the slightly aggressive side. On IVF D5NS w/ KCL 20meq/L @ 75 ml/hr started 7/26, x 1 day F/u with BMP in the AM.     Suzann Allean LABOR ,PharmD Clinical Pharmacist 08/11/2023 7:43 AM

## 2023-08-11 NOTE — Plan of Care (Signed)

## 2023-08-11 NOTE — Progress Notes (Signed)
 Speech Language Pathology Treatment: Dysphagia  Patient Details Name: Maxwell Webb MRN: [id number] DOB: 01-02-1951 Today's Date: 08/11/2023 Time: 9059-8969 SLP Time Calculation (min) (ACUTE ONLY): 50 min  Assessment / Plan / Recommendation Clinical Impression  Pt seen for ongoing assessment of swallowing; safety for oral intake. Pt alert/awake. NOnverbal this morning but opened mouth to presentation of each bolus- engaged in po tasks/meal fed to him by this SLP.  Slow Responses overall. He responded to the visual presentation of swabs/spoon to mouth/lips. Oral care provided prior to po's. On RA; afebrile.     Pt has a BASELINE dx of oropharyngeal phase Dysphagia per MBSS on 08/01/2023: moderate oropharyngeal dysphagia. Pt demonstrated oral holding and prolonged oral prep with both solids and purees, inconsistently with liquids. Incomplete mastication of solids. Repetition lingual motion also noted throughout study. Piecemeal swallow and/or oral residual noted. Pharyngeally, pt with mild-moderate vallecular stasis with solids in which liquid wash via straw attributed to before the swallow aspiration. Residual was trace-unremarkable with other consistencies. Inconsistently delayed swallow initiation noted. Above mentioned deficits due to lingual weakness/incoordination, reduced BOT retraction, and mistimed swallow initiation. Deficits likely exacerbated by cognitive status. Recommend a pureed diet (runnier purees preferred with additional gravies, sauces, and condiments to moisten) with thin liquids Recommend safe swallowing strategies/aspiration including, but not limited to, 1:1 assistance with feeding, avoidance of straws, watch for pharyngeal swallow and clearance of oral cavity before next bite/sip, and upright as possible for PO intake.  Unsure of his progress/status at his NH re: swallowing and oral intake since that study ~1 week ago.   Pt awake for Breakfast meal(on his tray table). SLP fed pt  his Meal of Nectar liquids and Purees; MIN-MOD verbal/tactile/visual stim given by this SLP during feeding/the meal. Aternated the trials to aid A-P transfer/swallow/oral clearing. Oral phase deficits c/b intermittent oral holding, min bolus residue post swallow, and slow/min lingual/labial movements intermittently- suspect a distraction and/or fatigue factor w/ the tasks(both mental and physical). Pharyngeal phase c/b min reduced hyolaryngeal excursion via palpation at neck but No overt clinical s/s of aspiration noted during the session. No decline in Pulmonary status during/post the meal.   Pt appears at risk for aspiration/aspiration pneumonia but this appears reduced when following aspiration precautions w/ feeding assistance/Supervision by Staff AND using a Dysphagia level 1(puree) w/ Nectar liquids via TSP diet. Pt is also at risk for not being able to meet his nutrition/hydration needs d/t dependency on being fed. Recommend frequent oral care when awake and before/after intake; Small/Single bites/sips w/ TIME b/t bites for oral clearing. Alternate foods/liquids to aid oral clearing. Aspiration precautions.  ST services can follow for toleration of diet and ongoing assessment to determine a least restrictive oral diet at his Facility per MD order there; education also. NSG and MD updated, agreed. Recommend Dietician f/u and Palliative Care consult for support/GOC.      HPI HPI: Pt is a 73 y.o. male with medical history significant of alcohol abuse, Wernicke Korsakoff syndrome, stroke, nonverbal at the baseline(?), HTN, HLD, DM, PVD, dCHF, depression with anxiety, s/p of bilateral BKA, who presents with tachycardia.  Per report, pt was noted to be tachycardic and less interactive in facility.  His heart rate was initially 142, which improved to 110s after giving IV fluid in ED.  Per ED physician, his mental status has improved and back to baseline after giving IV fluid. Patient has tachypnea with RR  38, no oxygen desaturation, 2 L oxygen was started in ED for comfort  reason.   CXR: Minimal peripheral opacity at the left lung base, atelectasis versus  pneumonia.  Pt resides at [facility]. Recent MBSS (OP) 07/2023 - see results in Imaging. A Pureed diet and thin liquids recommended then d/t Dysphagia noted.      SLP Plan  All goals met          Recommendations  Diet recommendations: Dysphagia 1 (puree);Nectar-thick liquid Liquids provided via: Teaspoon;Cup;Straw (monitor for needs) Medication Administration: Crushed with puree Supervision: Staff to assist with self feeding;Full supervision/cueing for compensatory strategies Compensations: Minimize environmental distractions;Slow rate;Small sips/bites;Lingual sweep for clearance of pocketing;Multiple dry swallows after each bite/sip;Follow solids with liquid Postural Changes and/or Swallow Maneuvers: Out of bed for meals;Seated upright 90 degrees;Upright 30-60 min after meal                 (Palliative Care f/u; Dietician f/u) Oral care BID;Oral care before and after PO;Staff/trained caregiver to provide oral care   Frequent or constant Supervision/Assistance Dysphagia, oropharyngeal phase (R13.12) (baseline Cogntiive decline; dependency w/ feeding)     All goals met       [name], MS, CCC-SLP Speech Language Pathologist Rehab Services; [facility] [place] [phone] (ascom) [name]  08/11/2023, 11:54 AM

## 2023-08-12 DIAGNOSIS — Z66 Do not resuscitate: Secondary | ICD-10-CM

## 2023-08-12 DIAGNOSIS — R6520 Severe sepsis without septic shock: Secondary | ICD-10-CM | POA: Diagnosis not present

## 2023-08-12 DIAGNOSIS — A419 Sepsis, unspecified organism: Secondary | ICD-10-CM | POA: Diagnosis not present

## 2023-08-12 DIAGNOSIS — Z7189 Other specified counseling: Secondary | ICD-10-CM

## 2023-08-12 DIAGNOSIS — Z789 Other specified health status: Secondary | ICD-10-CM

## 2023-08-12 LAB — RENAL FUNCTION PANEL
Albumin: 2.2 g/dL — ABNORMAL LOW (ref 3.5–5.0)
Anion gap: 11 (ref 5–15)
BUN: 11 mg/dL (ref 8–23)
CO2: 20 mmol/L — ABNORMAL LOW (ref 22–32)
Calcium: 8.5 mg/dL — ABNORMAL LOW (ref 8.9–10.3)
Chloride: 117 mmol/L — ABNORMAL HIGH (ref 98–111)
Creatinine, Ser: 0.59 mg/dL — ABNORMAL LOW (ref 0.61–1.24)
GFR, Estimated: >60 mL/min (ref >60)
Glucose, Bld: 108 mg/dL — ABNORMAL HIGH (ref 70–99)
Phosphorus: 2.3 mg/dL — ABNORMAL LOW (ref 2.5–4.6)
Potassium: 3.8 mmol/L (ref 3.5–5.1)
Sodium: 148 mmol/L — ABNORMAL HIGH (ref 135–145)

## 2023-08-12 LAB — MAGNESIUM: Magnesium: 1.9 mg/dL (ref 1.7–2.4)

## 2023-08-12 MED ORDER — POTASSIUM & SODIUM PHOSPHATES 280-160-250 MG PO PACK
2.0000 | PACK | Freq: Once | ORAL | Status: AC
  Administered 2023-08-12: 2 via ORAL
  Filled 2023-08-12: qty 2

## 2023-08-12 MED ORDER — NEPRO/CARBSTEADY PO LIQD
237.0000 mL | Freq: Two times a day (BID) | ORAL | Status: DC
  Administered 2023-08-12 – 2023-08-13 (×3): 237 mL via ORAL

## 2023-08-12 NOTE — TOC Progression Note (Signed)
 Transition of Care [facility]) - Progression Note    Patient Details  Name: Maxwell Webb MRN: [id number] Date of Birth: 20-Oct-1950  [facility] CM/SW Contact  Corean ONEIDA Haddock, RN Phone Number: 08/12/2023, 4:08 PM  Clinical Narrative:      Palliative has made referral to [facility] to [facility] updated TOC to complete Fl2 Signed DNR on [facility]                   Expected Discharge Plan and Services                                               Social Drivers of Health (SDOH) Interventions SDOH Screenings   Food Insecurity: Patient Unable To Answer (08/06/2023)  Housing: Unknown (08/06/2023)  Transportation Needs: Patient Unable To Answer (08/06/2023)  Utilities: Patient Unable To Answer (08/06/2023)  Social Connections: Patient Unable To Answer (08/06/2023)  Tobacco Use: High Risk (08/05/2023)    Readmission Risk Interventions     No data to display

## 2023-08-12 NOTE — Plan of Care (Signed)

## 2023-08-12 NOTE — Progress Notes (Signed)
 Palliative Care Progress Note, Assessment & Plan   Patient Name: Maxwell Webb       Date: 08/12/2023 DOB: 05/11/1950  Age: 73 y.o. MRN#: [id number] Attending Physician: Lanetta Lingo, MD Primary Care Physician: [place] Admit Date: 08/05/2023  Subjective: Patient states feels about the same.  He complains of headache this morning.  Denies shortness of breath or chest pain.  Reports he has no appetite.  HPI: 73 y.o. male  with past medical history of EtOH abuse, Warnicke Korsakoff syndrome, stroke, HTN, HLD, type 2 diabetes, PVD, diastolic CHF, depression, anxiety, s/p bilateral BKA, and nonverbal at baseline admitted from [facility] on 08/05/2023 with tachycardia and tachypnea.   Patient is being treated for severe sepsis due to possible HCAP versus aspiration pneumonia, dehydration, hypernatremia, and AKI.   Given patient's overall poor prognosis, PMT was consulted and she worked with patient and his legal guardian to address code status and boundaries/goals of care.  08/12/2023 PMT was reconsulted to evaluate patient due to pocketing food with difficulty swallowing, electrolyte imbalances due to not meeting nutritional needs and poor prognosis.  Summary of counseling/coordination of care: Extensive chart review completed prior to meeting patient including labs, vital signs, imaging, progress notes, orders, and available advanced directive documents from current and previous encounters.   After reviewing the patient's chart and I assessed patient at bedside.  Ill-appearing, elderly male lying in bed.  He is alert and oriented to self, states he is in [place] and Trump is president.  He incorrectly states year as 2013 and does not know he is in a hospital.  Patient is  extremely soft-spoken and difficult to understand but does engage in conversation.  Even, unlabored respirations.  He is in no distress.  Attempted to contact Candace Goble, legal guardian through DSS, for goals of care due to inability to maintain nutritional requirements and worsening poor prognosis.  Left HIPAA appropriate voicemail for legal guardian to return call regarding patient status.   Physical Exam Vitals reviewed.  Constitutional:      General: He is not in acute distress.    Appearance: He is ill-appearing. He is not toxic-appearing.  HENT:     Head: Normocephalic and atraumatic.     Mouth/Throat:     Mouth: Mucous membranes are dry.  Pulmonary:     Effort: Pulmonary effort is normal. No respiratory distress.  Musculoskeletal:     Comments: Bilateral BKA's  Skin:    General: Skin is warm and dry.  Neurological:     Mental Status: He is alert. He is disoriented.     Recommendations/Plan: Continue DNR/DNI status as previously documented    Continue current supportive interventions Plan to DC to SNF with hospice services when medically stable     Addendum 1350: Received return call from Sari Kerns, DSS legal guardian representative.  Updated on patient current status with severe electrolyte abnormality due to inability to satisfy nutritional needs.  Advised patient is pocketing food and is having difficulty swallowing.  Patient is unable to take most of his medications due to this new finding.  Sari shares that they had previously made patient DNR with no plan for feeding tube.  Recommended patient  discharged back to his SNF with hospice services.  Sari is in agreement with this plan for patient.  Referral placed to change previous palliative services to hospice services at facility.  Total Time 50 minutes    Discussed plan of care with Dr. Lanetta and Sari Kerns, DSS legal guardian.  Time spent includes: Detailed review of medical records (labs, imaging, vital  signs), medically appropriate exam (mental status, respiratory, cardiac, skin), discussed with treatment team, counseling and educating patient, family and staff, documenting clinical information, medication management and coordination of care.     Devere Sacks, ELNITA- [facility] Palliative Medicine Team  08/12/2023 11:20 AM  Office [phone]  Pager [phone]

## 2023-08-12 NOTE — Consult Note (Signed)
 PHARMACY CONSULT NOTE - FOLLOW UP  Pharmacy Consult for Electrolyte Monitoring and Replacement   Recent Labs: Potassium (mmol/L)  Date Value  08/12/2023 3.8   Magnesium (mg/dL)  Date Value  92/71/7974 1.9   Calcium  (mg/dL)  Date Value  92/71/7974 8.5 (L)   Albumin (g/dL)  Date Value  92/71/7974 2.2 (L)   Phosphorus (mg/dL)  Date Value  92/71/7974 2.3 (L)   Sodium (mmol/L)  Date Value  08/12/2023 148 (H)     Assessment: 73 y.o. male with medical history significant of alcohol abuse, Wernicke Korsakoff syndrome, stroke, nonverbal at the baseline, HTN, HLD, DM, PVD, dCHF, depression with anxiety, s/p of bilateral BKA, who presents with tachycardia.  Goal of Therapy:  WNL  Plan: Phos 2.3 >> Phos-NaK 500mg  packets PO x 1 No other electrolyte replacement currently warranted Check BMP, Phos with tomorrow AM labs  Will M. Lenon, PharmD Clinical Pharmacist 08/12/2023 7:28 AM

## 2023-08-12 NOTE — Progress Notes (Signed)
 [facility] Liaison Note  Referral received today from [facility], Corean Haddock, RN, for Hospice services at [facility] at [employer].  Spoke with patient's Guardian, Candice Gobble with [facility].  She is agreeable to Hospice follow up at dc.  Referral submitted today.  Please call with any Hospice related questions or concerns  Thank you for the opportunity to participate in this patient's care.  [facility] Liaison  [phone]

## 2023-08-12 NOTE — Progress Notes (Signed)
 Progress Note   Patient: Maxwell Webb FMW:[id number] DOB: 1950-06-26 DOA: 08/05/2023     7 DOS: the patient was seen and examined on 08/12/2023   Brief hospital course:  73 y.o. male with medical history significant of alcohol abuse, Warnicke Korsakoff syndrome, stroke, nonverbal at the baseline, HTN, HLD, DM, PVD, dCHF, depression with anxiety, s/p of bilateral BKA, who presents with tachycardia.   Patient is nonverbal at baseline and is unable to provide medical history, therefore, most of the history is obtained by discussing the case with ED physician, per EMS report, and with the nursing staff.   Per report, pt was noted to be tachycardic and less interactive in facility.  His heart rate was initially 142, which improved to 110s after giving IV fluid in ED.  Per ED physician, his mental status has improved and back to baseline after giving IV fluid.  When I saw patient in ED, patient very slowly and minimally waves his hand, indicating that he probably understands our conversation.  Patient has tachypnea with RR 38, no oxygen desaturation, 2 L oxygen was started in ED for comfort reason.  No active nausea, vomiting, diarrhea noted.  No active respiratory distress, cough noted.  Does not seem to have chest pain or abdominal pain.  Not sure if patient has symptoms of UTI.     Data reviewed independently and ED Course: pt was found to have WBC 20.7, AKI, lactic acid of 5.2, procalcitonin 0.32, INR 1.2, sodium 148,  pending UA. Temperature 98.6, blood pressure 129/85, heart rate 142 --> 110s, RR 38, oxygen sat 98% on 2 L oxygen.  Chest x-ray showed minimal left basilar opacity.  Patient is admitted to PCU as inpatient.   7/26.  Patient lethargic this morning.  Did not eat breakfast and did not eat much for lunch.  Nursing staff only trouble giving him meds.   7/27  Continues to have poor oral intake, even with assistance with his meals, patient continues to pocket meals and has difficulty  swallowing.  Palliative care consult requested for goals of care.  7/28 Discussed with palliative care and the goal is for discharge to SNF with hospice if legal guardian is okay with the recommendation    Assessment and Plan:  * Severe sepsis [facility]) Present on admission.  Secondary to left basilar pneumonia POA as evidenced by tachycardia, leukocytosis tachypnea and lactic acidosis of 5.2.   Completed course of  IV Zosyn . Appreciate speech therapy input, swallow function evaluation done with no evidence of aspiration.  Recommends to continue dysphagia 1 diet with nectar thick liquids     Acute metabolic encephalopathy Lethargy Patient is nonverbal at baseline Overall prognosis poor.     Hypernatremia Sodium 146 >> 148   Likely will get worse over time with poor oral intake. Continue dextrose  containing fluids to administer free water Poor prognosis     AKI (acute kidney injury) (HCC) Improved Creatinine 1.6 on presentation and down to 0.6.     Chronic diastolic CHF (congestive heart failure) (HCC) Last EF 55%. Not acutely exacerbated   HTN (hypertension) Patient is normotensive off metoprolol  Continue to monitor blood pressure closely   HLD (hyperlipidemia) Lipitor if able to take   Diabetes mellitus without complication (HCC) Hemoglobin A1c 6.2.  Hold metformin   Stroke (HCC) On Lipitor   Depression with anxiety Continue Depakote    Decubitus ulcer of left buttock, stage 2 (HCC) Present on admission, local wound care.  Subjective: No change overnight  Physical Exam: Vitals:   08/11/23 0815 08/11/23 1641 08/12/23 0414 08/12/23 0757  BP: 129/85 111/82 107/79 119/81  Pulse: 70 80 74 93  Resp: 20 19 16 20   Temp: 97.8 F (36.6 C) 98.4 F (36.9 C) 97.8 F (36.6 C) 97.9 F (36.6 C)  TempSrc:  Oral Oral Oral  SpO2: 98% 98% 97% 95%  Weight:      Height:       Head: Normocephalic.     Mouth/Throat:     Pharynx: No oropharyngeal  exudate.  Eyes:     General: Lids are normal.     Conjunctiva/sclera: Conjunctivae normal.  Cardiovascular:     Rate and Rhythm: Normal rate and regular rhythm.     Heart sounds: Normal heart sounds, S1 normal and S2 normal.  Pulmonary:     Breath sounds: No decreased breath sounds, wheezing, rhonchi or rales.  Abdominal:     Palpations: Abdomen is soft.     Tenderness: There is no abdominal tenderness.  Musculoskeletal:     Right Lower Extremity: Right leg is amputated below knee.     Left Lower Extremity: Left leg is amputated below knee.  Skin:    General: Skin is warm.     Findings: No rash.  Neurological:     Mental Status: Lethargic but arousable   Data Reviewed: Sodium 148, chloride 117, bicarb 20 Labs reviewed  Family Communication: Voicemail left for legal guardian  Disposition: Status is: Inpatient Remains inpatient appropriate because: Discussions on discharge disposition  Planned Discharge Destination: TBD    Time spent: 40 minutes  Author: Aimee Somerset, MD 08/12/2023 12:43 PM  For on call review [email or web address].

## 2023-08-13 DIAGNOSIS — R6520 Severe sepsis without septic shock: Secondary | ICD-10-CM | POA: Diagnosis not present

## 2023-08-13 DIAGNOSIS — A419 Sepsis, unspecified organism: Secondary | ICD-10-CM | POA: Diagnosis not present

## 2023-08-13 LAB — BASIC METABOLIC PANEL WITH GFR
Anion gap: 5 (ref 5–15)
BUN: 9 mg/dL (ref 8–23)
CO2: 25 mmol/L (ref 22–32)
Calcium: 8.3 mg/dL — ABNORMAL LOW (ref 8.9–10.3)
Chloride: 114 mmol/L — ABNORMAL HIGH (ref 98–111)
Creatinine, Ser: 0.54 mg/dL — ABNORMAL LOW (ref 0.61–1.24)
GFR, Estimated: >60 mL/min (ref >60)
Glucose, Bld: 96 mg/dL (ref 70–99)
Potassium: 3.2 mmol/L — ABNORMAL LOW (ref 3.5–5.1)
Sodium: 144 mmol/L (ref 135–145)

## 2023-08-13 LAB — PHOSPHORUS: Phosphorus: 3.1 mg/dL (ref 2.5–4.6)

## 2023-08-13 MED ORDER — MELATONIN 5 MG PO TABS: 5.0000 mg | ORAL_TABLET | Freq: Every day | ORAL | 0 refills | Status: AC

## 2023-08-13 MED ORDER — ALBUTEROL SULFATE (2.5 MG/3ML) 0.083% IN NEBU: 2.5000 mg | INHALATION_SOLUTION | RESPIRATORY_TRACT | 12 refills | Status: DC | PRN

## 2023-08-13 MED ORDER — POTASSIUM CHLORIDE CRYS ER 20 MEQ PO TBCR
40.0000 meq | EXTENDED_RELEASE_TABLET | Freq: Once | ORAL | Status: AC
  Administered 2023-08-13: 40 meq via ORAL
  Filled 2023-08-13: qty 2

## 2023-08-13 NOTE — Progress Notes (Signed)
 An attempt was made to call report. This nurse was put on hold three different times, and then they hung up the phone. Will reattempt to give report prior to the patient leaving.

## 2023-08-13 NOTE — NC FL2 (Signed)
 [place]  MEDICAID FL2 LEVEL OF CARE FORM     IDENTIFICATION  Patient Name: Maxwell Webb Birthdate: 1950/01/25 Sex: male Admission Date (Current Location): 08/05/2023  [facility] and [place] Number:  Chiropodist and Address:  [facility], [address], [place], [place] [address]      Provider Number: [id number]  Attending Physician Name and Address:  Lanetta Lingo, MD  Relative Name and Phone Number:  Gobble,Candice DSS (Legal Guardian)  [phone] (Work Phone)    Current Level of Care: Hospital Recommended Level of Care: Skilled Nursing Facility Prior Approval Number:    Date Approved/Denied:   PASRR Number: [id number]  Discharge Plan: SNF    Current Diagnoses: Patient Active Problem List   Diagnosis Date Noted   Decubitus ulcer of left buttock, stage 2 (HCC) 08/10/2023   Lethargy 08/10/2023   Severe sepsis (HCC) 08/05/2023   HCAP (healthcare-associated pneumonia) 08/05/2023   HTN (hypertension) 08/05/2023   HLD (hyperlipidemia) 08/05/2023   Diabetes mellitus without complication (HCC) 08/05/2023   Chronic diastolic CHF (congestive heart failure) (HCC) 08/05/2023   AKI (acute kidney injury) (HCC) 08/05/2023   Stroke (HCC) 08/05/2023   Depression with anxiety 08/05/2023   Dehydration 08/05/2023   Hypernatremia 08/05/2023   OSA (obstructive sleep apnea) 02/04/2019   S/P bilateral BKA (below knee amputation) (HCC) 02/04/2019   Depression 02/04/2019   Anxiety 02/04/2019   AMS (altered mental status) 02/03/2019   Numbness and tingling in left hand 04/17/2016   CVA (cerebral vascular accident) (HCC) 02/16/2016   TIA (transient ischemic attack) 01/04/2016   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  Pt hx of  Bilateral BKA.)  BEHAVIORAL SYMPTOMS/MOOD NEUROLOGICAL BOWEL NUTRITION STATUS      Incontinent Diet (Fluids- Nectar thick, extra gravies on meats, potatoes, yogurt TID with meals. No straws!)  AMBULATORY STATUS COMMUNICATION OF NEEDS Skin   Total Care Verbally PU Stage and Appropriate Care   PU Stage 2 Dressing:  (Q3 days and PRN for soiling)                   Personal Care Assistance Level of Assistance  Total care Bathing Assistance: Maximum assistance Feeding assistance: Maximum assistance Dressing Assistance: Maximum assistance Total Care Assistance: Maximum assistance   Functional Limitations Info  Speech Sight Info: Adequate Hearing Info: Adequate Speech Info: Impaired    SPECIAL CARE FACTORS FREQUENCY                       Contractures Contractures Info: Not present    Additional Factors Info  Code Status (Hospice) Code Status Info: DNR Allergies Info: NKDA           Current Medications (08/13/2023):  This is the current hospital active medication list Current Facility-Administered Medications  Medication Dose Route Frequency Provider Last Rate Last Admin   acetaminophen  (TYLENOL ) suppository 650 mg  650 mg Rectal Q6H PRN Niu, Xilin, MD       albuterol  (PROVENTIL ) (2.5 MG/3ML) 0.083% nebulizer solution 2.5 mg  2.5 mg Nebulization Q4H PRN Niu, Xilin, MD       atorvastatin  (LIPITOR) tablet 10 mg  10 mg Oral QHS Niu, Xilin, MD   10 mg at 08/12/23 2039   cholecalciferol  (VITAMIN D3) 25  MCG (1000 UNIT) tablet 4,000 Units  4,000 Units Oral Daily Niu, Xilin, MD   4,000 Units at 08/13/23 0805   clopidogrel  (PLAVIX ) tablet 75 mg  75 mg Oral Daily Niu, Xilin, MD   75 mg at 08/13/23 0805   dextromethorphan-guaiFENesin  (MUCINEX  DM) 30-600 MG per 12 hr tablet 1 tablet  1 tablet Oral BID PRN Niu, Xilin, MD       divalproex  (DEPAKOTE  SPRINKLE) capsule 250 mg  250 mg Oral Q12H Niu, Xilin, MD   250 mg at 08/12/23 2039   enoxaparin  (LOVENOX ) injection 40 mg  40 mg  Subcutaneous Q24H Niu, Xilin, MD   40 mg at 08/12/23 2039   feeding supplement (NEPRO CARB STEADY) liquid 237 mL  237 mL Oral BID BM Agbata, Tochukwu, MD   237 mL at 08/13/23 0805   FLUoxetine  (PROZAC ) capsule 40 mg  40 mg Oral Daily Niu, Xilin, MD   40 mg at 08/12/23 9162   melatonin tablet 5 mg  5 mg Oral QHS Niu, Xilin, MD   5 mg at 08/12/23 2039   nicotine  (NICODERM CQ  - dosed in mg/24 hours) patch 21 mg  21 mg Transdermal Daily Niu, Xilin, MD   21 mg at 08/13/23 9193   olopatadine  (PATANOL) 0.1 % ophthalmic solution 1 drop  1 drop Both Eyes Daily Niu, Xilin, MD   1 drop at 08/13/23 9193   ondansetron  (ZOFRAN ) injection 4 mg  4 mg Intravenous Q8H PRN Niu, Xilin, MD       thiamine  (VITAMIN B1) tablet 100 mg  100 mg Oral Daily Niu, Xilin, MD   100 mg at 08/13/23 9194     Discharge Medications: Please see discharge summary for a list of discharge medications.  Relevant Imaging Results:  Relevant Lab Results:   Additional Information [id number]  Hedwig Mcfall M Frimet Durfee, RN

## 2023-08-13 NOTE — Progress Notes (Signed)
 Report called to the nurse at [facility]. All of her questions and concerns were addressed to her satisfaction. She had no other questions or concerns at this time. EMS at bedside to transport patient.

## 2023-08-13 NOTE — Consult Note (Signed)
 PHARMACY CONSULT NOTE - FOLLOW UP  Pharmacy Consult for Electrolyte Monitoring and Replacement   Recent Labs: Potassium (mmol/L)  Date Value  08/13/2023 3.2 (L)   Magnesium (mg/dL)  Date Value  92/71/7974 1.9   Calcium  (mg/dL)  Date Value  92/70/7974 8.3 (L)   Albumin (g/dL)  Date Value  92/71/7974 2.2 (L)   Phosphorus (mg/dL)  Date Value  92/70/7974 3.1   Sodium (mmol/L)  Date Value  08/13/2023 144     Assessment: 73 y.o. male with medical history significant of alcohol abuse, Wernicke Korsakoff syndrome, stroke, nonverbal at the baseline, HTN, HLD, DM, PVD, dCHF, depression with anxiety, s/p of bilateral BKA, who presents with tachycardia.  Goal of Therapy:  WNL  Plan: K 3.2 >> potassium chloride  40 mEq PO x 1 No other electrolyte replacement currently warranted Check BMP tomorrow with AM labs   Will M. Lenon, PharmD Clinical Pharmacist 08/13/2023 7:18 AM

## 2023-08-13 NOTE — TOC Transition Note (Signed)
 Transition of Care [facility]) - Discharge Note   Patient Details  Name: Rutilio Yellowhair MRN: [id number] Date of Birth: 11-29-1950  [facility] CM/SW Contact:  Corean ONEIDA Haddock, RN Phone Number: 08/13/2023, 10:27 AM   Clinical Narrative:      Left Sari kerns with DSS a VM to notify of discharge [place] with [facility] notified of discharge  Patient will DC un:Ezjx Anticipated DC date: 08/13/23   Transport by: Suszanne  Per MD patient ready for DC to . RN,  and facility notified of DC. Discharge Summary sent to facility. RN given number for report. DC packet on chart. Ambulance transport requested for patient.   TOC signing off.        Patient Goals and CMS Choice            Discharge Placement                       Discharge Plan and Services Additional resources added to the After Visit Summary for                                       Social Drivers of Health (SDOH) Interventions SDOH Screenings   Food Insecurity: Patient Unable To Answer (08/06/2023)  Housing: Unknown (08/06/2023)  Transportation Needs: Patient Unable To Answer (08/06/2023)  Utilities: Patient Unable To Answer (08/06/2023)  Social Connections: Patient Unable To Answer (08/06/2023)  Tobacco Use: High Risk (08/05/2023)     Readmission Risk Interventions     No data to display

## 2023-08-13 NOTE — Discharge Summary (Signed)
 Physician Discharge Summary   Patient: Maxwell Webb MRN: [id number] DOB: 25-Jun-1950  Admit date:     08/05/2023  Discharge date: 08/13/23  Discharge Physician: Maryama Kuriakose   PCP: [facility]   Recommendations at discharge:   Discharge to skilled nursing facility with hospice Pleasure feeds as tolerated  Discharge Diagnoses: Principal Problem:   Severe sepsis (HCC) Active Problems:   HCAP (healthcare-associated pneumonia)   Lethargy   Dehydration   Hypernatremia   AKI (acute kidney injury) (HCC)   Chronic diastolic CHF (congestive heart failure) (HCC)   HTN (hypertension)   HLD (hyperlipidemia)   Diabetes mellitus without complication (HCC)   Stroke (HCC)   Depression with anxiety   S/P bilateral BKA (below knee amputation) (HCC)   Decubitus ulcer of left buttock, stage 2 (HCC)  Resolved Problems:   * No resolved hospital problems. *  Hospital Course:  73 y.o. male with medical history significant of alcohol abuse, Warnicke Korsakoff syndrome, stroke, nonverbal at the baseline, HTN, HLD, DM, PVD, dCHF, depression with anxiety, s/p of bilateral BKA, who presents with tachycardia.   Patient is nonverbal at baseline and is unable to provide medical history, therefore, most of the history is obtained by discussing the case with ED physician, per EMS report, and with the nursing staff.   Per report, pt was noted to be tachycardic and less interactive in facility.  His heart rate was initially 142, which improved to 110s after giving IV fluid in ED.  Per ED physician, his mental status has improved and back to baseline after giving IV fluid.  When I saw patient in ED, patient very slowly and minimally waves his hand, indicating that he probably understands our conversation.  Patient has tachypnea with RR 38, no oxygen desaturation, 2 L oxygen was started in ED for comfort reason.  No active nausea, vomiting, diarrhea noted.  No active respiratory distress, cough  noted.  Does not seem to have chest pain or abdominal pain.  Not sure if patient has symptoms of UTI.     Data reviewed independently and ED Course: pt was found to have WBC 20.7, AKI, lactic acid of 5.2, procalcitonin 0.32, INR 1.2, sodium 148,  pending UA. Temperature 98.6, blood pressure 129/85, heart rate 142 --> 110s, RR 38, oxygen sat 98% on 2 L oxygen.  Chest x-ray showed minimal left basilar opacity.  Patient is admitted to PCU as inpatient.   7/26.  Patient lethargic this morning.  Did not eat breakfast and did not eat much for lunch.  Nursing staff only trouble giving him meds.   7/27  Continues to have poor oral intake, even with assistance with his meals, patient continues to pocket meals and has difficulty swallowing.  Palliative care consult requested for goals of care.   7/28 Discussed with palliative care and the goal is for discharge to SNF with hospice if legal guardian is okay with the recommendation         Assessment and Plan:  * Severe sepsis [facility]) Present on admission.  Secondary to left basilar pneumonia POA as evidenced by tachycardia, leukocytosis tachypnea and lactic acidosis of 5.2.   Completed course of  IV Zosyn . Appreciate speech therapy input, swallow function evaluation done with no evidence of aspiration.  Recommends to continue dysphagia 1 diet with nectar thick liquids     Acute metabolic encephalopathy Lethargy Patient is nonverbal at baseline Overall prognosis poor.     Hypernatremia Sodium 146 >> 148   Likely will  get worse over time with poor oral intake. Poor prognosis     AKI (acute kidney injury) (HCC) Improved Creatinine 1.6 on presentation and down to 0.6.     Chronic diastolic CHF (congestive heart failure) (HCC) Last EF 55%. Not acutely exacerbated   HTN (hypertension) Patient is normotensive off metoprolol  Continue to monitor blood pressure closely We will discontinue metoprolol    HLD (hyperlipidemia) Lipitor if able  to take   Diabetes mellitus without complication (HCC) Hemoglobin A1c 6.2.  Hold metformin Liberalize diet Pleasure feeds as tolerated   Stroke (HCC) On Lipitor   Depression with anxiety Continue Depakote    Decubitus ulcer of left buttock, stage 2 (HCC) Present on admission, local wound care. DuoDERM every 72 hours        Consultants: Palliative care Procedures performed: None Disposition: Skilled nursing facility with hospice Diet recommendation:  Discharge Diet Orders (From admission, onward)     Start     Ordered   08/13/23 0000  Diet general        08/13/23 0823           Dysphagia type 1 Nectar thick Liquid DISCHARGE MEDICATION: Allergies as of 08/13/2023   No Known Allergies      Medication List     STOP taking these medications    metFORMIN 1000 MG tablet Commonly known as: GLUCOPHAGE   metoprolol  succinate 25 MG 24 hr tablet Commonly known as: TOPROL -XL   sodium chloride  0.9 % infusion   Vitamin D  50 MCG (2000 UT) tablet       TAKE these medications    acetaminophen  325 MG tablet Commonly known as: TYLENOL  Take 650 mg by mouth every 4 (four) hours as needed for mild pain, moderate pain or headache.   albuterol  (2.5 MG/3ML) 0.083% nebulizer solution Commonly known as: PROVENTIL  Take 3 mLs (2.5 mg total) by nebulization every 4 (four) hours as needed for wheezing or shortness of breath.   atorvastatin  10 MG tablet Commonly known as: LIPITOR Take 10 mg by mouth daily.   clopidogrel  75 MG tablet Commonly known as: PLAVIX  Take 1 tablet (75 mg total) by mouth daily.   divalproex  125 MG capsule Commonly known as: DEPAKOTE  SPRINKLE Take 250 mg by mouth every 12 (twelve) hours. What changed: Another medication with the same name was removed. Continue taking this medication, and follow the directions you see here.   FLUoxetine  40 MG capsule Commonly known as: PROZAC  Take 40 mg by mouth daily.   medroxyPROGESTERone 10 MG  tablet Commonly known as: PROVERA Take 10 mg by mouth daily.   melatonin 5 MG Tabs Take 3 mg by mouth at bedtime. What changed: Another medication with the same name was added. Make sure you understand how and when to take each.   melatonin 5 MG Tabs Take 1 tablet (5 mg total) by mouth at bedtime. What changed: You were already taking a medication with the same name, and this prescription was added. Make sure you understand how and when to take each.   Pataday  0.7 % Soln Generic drug: Olopatadine  HCl Apply 1 drop to eye daily.   risperiDONE microspheres 50 MG injection Commonly known as: RISPERDAL CONSTA Inject into the muscle. 50MG /2ML, intramuscular, twice a week   thiamine  100 MG tablet Commonly known as: Vitamin B-1 Take 100 mg by mouth daily.               Discharge Care Instructions  (From admission, onward)  Start     Ordered   08/13/23 0000  Discharge wound care:       Comments: Apply Xeroform gauze to left buttock and sacrum Q day, then cover with foam dressing.  Change foam dressing Q 3 days or PRN soiling.   08/13/23 0823            Discharge Exam: Filed Weights   08/09/23 0500 08/10/23 0540 08/11/23 0440  Weight: 84.5 kg 81.8 kg 82.2 kg  Head: Normocephalic.     Mouth/Throat:     Pharynx: No oropharyngeal exudate.  Eyes:     General: Lids are normal.     Conjunctiva/sclera: Conjunctivae normal.  Cardiovascular:     Rate and Rhythm: Normal rate and regular rhythm.     Heart sounds: Normal heart sounds, S1 normal and S2 normal.  Pulmonary:     Breath sounds: No decreased breath sounds, wheezing, rhonchi or rales.  Abdominal:     Palpations: Abdomen is soft.     Tenderness: There is no abdominal tenderness.  Musculoskeletal:     Right Lower Extremity: Right leg is amputated below knee.     Left Lower Extremity: Left leg is amputated below knee.  Skin:    General: Skin is warm.     Findings: No rash.  Neurological:     Mental  Status: Lethargic but arousable   Condition at discharge: poor  The results of significant diagnostics from this hospitalization (including imaging, microbiology, ancillary and laboratory) are listed below for reference.   Imaging Studies: DG Chest Port 1 View Result Date: 08/05/2023 CLINICAL DATA:  Provided history: Questionable sepsis - evaluate for abnormality Altered with tachycardia. EXAM: PORTABLE CHEST 1 VIEW COMPARISON:  Radiograph 02/03/2019 FINDINGS: Minimal peripheral opacity at the left lung base. Right lung is clear. Normal heart size with stable mediastinal contours. Aortic atherosclerosis. No pulmonary edema, large pleural effusion, or pneumothorax. IMPRESSION: Minimal peripheral opacity at the left lung base, atelectasis versus pneumonia. Electronically Signed   By: Andrea Gasman M.D.   On: 08/05/2023 16:54   DG SWALLOW FUNC OP MEDICARE SPEECH PATH Result Date: 08/01/2023 Table formatting from the original result was not included. CLINICAL DATA:  73 year old male with a history of Wernicke's encephalopathy referred for modified barium swallow due to dysphagia and coughing with eating. EXAM: MODIFIED BARIUM SWALLOW TECHNIQUE: Different consistencies of barium were administered orally to the patient by the Speech Pathologist. Imaging of the pharynx was performed in the lateral projection. McKenzie McInnis PA-C was present in the fluoroscopy room during this study, which was supervised and interpreted by Dr. KANDICE Moan. FLUOROSCOPY: Radiation Exposure Index (as provided by the fluoroscopic device): 19.7 mGy Kerma COMPARISON:  None Available. FINDINGS: Vestibular Penetration: Vestibular penetration appreciated once with thin barium. Aspiration: Sensed aspiration with one swallow with thin barium. Patient with immediate coughing and partial clearance of contrast. Other:  Notable poor oral propulsion of bolus into the oropharynx. IMPRESSION: One occurrence sensed aspiration. Please refer to  the Speech Pathologists report for complete details and recommendations. Electronically Signed   By: Marcey Moan M.D.   On: 08/01/2023 13:34 Modified Barium Swallow Study Patient Details Name: Myking Sar MRN: [id number] Date of Birth: 05-Nov-1950 Today's Date: 08/01/2023 HPI/PMH: HPI: 73 y.o. male who presents for MBSS from SNF for acute cough. Pt with hx of B amputations with B prostheses (not present), Wernicke's Encephalopathy, HLD, HTN, sleep apnea, and stroke.  Head CT. 02/03/19, 1. No evidence of acute intracranial process.  2. Moderate atrophy and  chronic microvascular ischemic changes of  the white matter. Clinical Impression: Clinical Impression: Pt presents with a moderate oropharyngeal dysphagia. Pt demonstrated oral holding and prolonged oral prep with both solids and purees, inconsistently with liquids. Incomplete mastication of solids. Repetition lingual motion also noted throughout study. Piecemeal swallow and/or oral residual noted. Pharyngeally, pt with mild-moderate vallecular stasis with solids in which liquid wash via straw attributed to before the swallow audible aspiration. Partial clearance with cough. Residual was trace-unremarkable with other consistencies. Inconsistently delayed swallow initiation noted. Above mentioned deficits due to lingual weakness/incoordination, reduced BOT retraction, and mistimed swallow initiation. Deficits likely exacerbated by cognitive status. Recommend a pureed diet (runnier purees preferred with additional gravies, sauces, and condiments to moisten) with thin liquids Recommend safe swallowing strategies/aspiration including, but not limited to, 1:1 assistance with feeding, avoidance of straws, watch for pharyngeal swallow and clearance of oral cavity before next bite/sip, and upright as possible for PO intake. Factors that may increase risk of adverse event in presence of aspiration Noe & Lianne 2021): Factors that may increase risk of adverse event in  presence of aspiration Noe & Lianne 2021): Limited mobility; Frail or deconditioned; Inadequate oral hygiene; Weak cough; Aspiration of thick, dense, and/or acidic materials Recommendations/Plan: Swallowing Evaluation Recommendations Swallowing Evaluation Recommendations Recommendations: PO diet PO Diet Recommendation: Dysphagia 1 (Pureed); Full liquid diet (runny purees with extra gravies, sauces, condiments) Liquid Administration via: Spoon; Cup; No straw Medication Administration: Crushed with puree Supervision: Full assist for feeding; Full supervision/cueing for swallowing strategies Swallowing strategies  : Slow rate; Small bites/sips; Check for pocketing or oral holding Postural changes: Position pt fully upright for meals; Stay upright 30-60 min after meals; Out of bed for meals Oral care recommendations: Oral care QID (4x/day); Staff/trained caregiver to provide oral care Treatment Plan Treatment Plan Treatment recommendations: Defer treatment plan to SLP at other venue (see follow-up recommendations) Follow-up recommendations: -- (at [facility]) Recommendations Recommendations for follow up therapy are one component of a multi-disciplinary discharge planning process, led by the attending physician.  Recommendations may be updated based on patient status, additional functional criteria and insurance authorization. Assessment: Orofacial Exam: Orofacial Exam Oral Cavity: Oral Hygiene: Dried secretions; Lingual coating Oral Cavity - Dentition: Poor condition Orofacial Anatomy: Other (comment) Oral Motor/Sensory Function: Generalized oral weakness Anatomy: Anatomy: WFL Boluses Administered: Boluses Administered Boluses Administered: Thin liquids (Level 0); Mildly thick liquids (Level 2, nectar thick); Puree; Solid  Oral Impairment Domain: Oral Impairment Domain Lip Closure: Escape from interlabial space or lateral juncture, no extension beyond vermillion border Tongue control during bolus hold: Not tested Bolus  preparation/mastication: Disorganized chewing/mashing with solid pieces of bolus unchewed Bolus transport/lingual motion: Repetitive/disorganized tongue motion Oral residue: Majority of bolus remaining Initiation of pharyngeal swallow : Pyriform sinuses  Pharyngeal Impairment Domain: Pharyngeal Impairment Domain Soft palate elevation: No bolus between soft palate (SP)/pharyngeal wall (PW) Laryngeal elevation: Complete superior movement of thyroid cartilage with complete approximation of arytenoids to epiglottic petiole Anterior hyoid excursion: Complete anterior movement Epiglottic movement: Complete inversion Laryngeal vestibule closure: Complete, no air/contrast in laryngeal vestibule Pharyngeal stripping wave : Present - diminished Tongue base retraction: Narrow column of contrast or air between tongue base and PPW; Wide column of contrast or air between tongue base and PPW Pharyngeal residue: Collection of residue within or on pharyngeal structures Location of pharyngeal residue: Valleculae; Aryepiglottic folds  Esophageal Impairment Domain: Esophageal Impairment Domain Esophageal clearance upright position: -- (appeared WFL in lateral positition) Pill: Pill Consistency administered: -- (DNT) Penetration/Aspiration Scale Score:  Penetration/Aspiration Scale Score 1.  Material does not enter airway: Thin liquids (Level 0); Mildly thick liquids (Level 2, nectar thick); Puree; Solid 7.  Material enters airway, passes BELOW cords and not ejected out despite cough attempt by patient: Thin liquids (Level 0) (via straw when used as liquid wash for solid) Compensatory Strategies: Compensatory Strategies Compensatory strategies: Yes Straw: Ineffective Ineffective Straw: Thin liquid (Level 0) Liquid wash: -- (inconsistent)   General Information: Caregiver present: No  Diet Prior to this Study: -- (unknown; from SNF)   No data recorded  Respiratory Status: WFL   Supplemental O2: None (Room air)   History of Recent  Intubation: No  Behavior/Cognition: Alert; Lethargic/Drowsy; Distractible; Requires cueing; Doesn't follow directions Self-Feeding Abilities: -- (assistance needed during evaluation) Baseline vocal quality/speech: Dysphonic; Hypophonia/low volume Volitional Cough: Unable to elicit No data recorded Exam Limitations: -- (unable to transfer pt to [facility] chair; study performed in w/c) Goal Planning: Prognosis for improved oropharyngeal function: Fair Barriers to Reach Goals: Cognitive deficits; Severity of deficits; Behavior No data recorded No data recorded No data recorded Pain: Pain Assessment Pain Assessment: Faces Pain Score: 0 End of Session: Start Time:No data recorded Stop Time: No data recorded Time Calculation:No data recorded Charges: SLP Evaluations $ SLP Speech Visit: 1 Visit SLP Evaluations $Outpatient MBS Swallow: 1 Procedure SLP visit diagnosis: SLP Visit Diagnosis: Dysphagia, oropharyngeal phase (R13.12) Past Medical History: Past Medical History: Diagnosis Date  Anxiety   Depression   Hyperlipemia   Hypertension   Korsakoff disease (HCC)   Nicotine  abuse   Osteomyelitis (HCC)   PVD (peripheral vascular disease) (HCC)   Stroke (HCC)   Wernicke encephalopathy  Past Surgical History: Past Surgical History: Procedure Laterality Date  BELOW KNEE LEG AMPUTATION Bilateral   FINGER SURGERY    left index finger - as a child Delon Bangs, M.S., CCC-SLP Speech-Language Pathologist [facility] [phone] (ASCOM) Delon CHRISTELLA Bangs 08/01/2023, 1:46 PM    Microbiology: Results for orders placed or performed during the hospital encounter of 08/05/23  Blood Culture (routine x 2)     Status: None   Collection Time: 08/05/23  4:58 PM   Specimen: BLOOD  Result Value Ref Range Status   Specimen Description BLOOD BLOOD LEFT HAND  Final   Special Requests   Final    BOTTLES DRAWN AEROBIC ONLY Blood Culture results may not be optimal due to an inadequate volume of blood  received in culture bottles   Culture   Final    NO GROWTH 5 DAYS Performed at [facility], [address] [place], [place] [address]    Report Status 08/10/2023 FINAL  Final  Blood Culture (routine x 2)     Status: None   Collection Time: 08/05/23  5:20 PM   Specimen: BLOOD  Result Value Ref Range Status   Specimen Description BLOOD BLOOD RIGHT HAND  Final   Special Requests   Final    BOTTLES DRAWN AEROBIC ONLY Blood Culture results may not be optimal due to an inadequate volume of blood received in culture bottles   Culture   Final    NO GROWTH 5 DAYS Performed at [facility], [address] [place], [place] [address]    Report Status 08/10/2023 FINAL  Final  Resp panel by RT-PCR (RSV, Flu A&B, Covid) Anterior Nasal Swab     Status: None   Collection Time: 08/05/23  8:11 PM   Specimen: Anterior Nasal Swab  Result Value Ref  Range Status   SARS Coronavirus 2 by RT PCR NEGATIVE NEGATIVE Final    Comment: (NOTE) SARS-CoV-2 target nucleic acids are NOT DETECTED.  The SARS-CoV-2 RNA is generally detectable in upper respiratory specimens during the acute phase of infection. The lowest concentration of SARS-CoV-2 viral copies this assay can detect is 138 copies/mL. A negative result does not preclude SARS-Cov-2 infection and should not be used as the sole basis for treatment or other patient management decisions. A negative result may occur with  improper specimen collection/handling, submission of specimen other than nasopharyngeal swab, presence of viral mutation(s) within the areas targeted by this assay, and inadequate number of viral copies(<138 copies/mL). A negative result must be combined with clinical observations, patient history, and epidemiological information. The expected result is Negative.  Fact Sheet for Patients:  BloggerCourse.com  Fact Sheet for Healthcare Providers:   SeriousBroker.it  This test is no t yet approved or cleared by the [place]  FDA and  has been authorized for detection and/or diagnosis of SARS-CoV-2 by FDA under an Emergency Use Authorization (EUA). This EUA will remain  in effect (meaning this test can be used) for the duration of the COVID-19 declaration under Section 564(b)(1) of the Act, 21 U.S.C.section 360bbb-3(b)(1), unless the authorization is terminated  or revoked sooner.       Influenza A by PCR NEGATIVE NEGATIVE Final   Influenza B by PCR NEGATIVE NEGATIVE Final    Comment: (NOTE) The Xpert Xpress SARS-CoV-2/FLU/RSV plus assay is intended as an aid in the diagnosis of influenza from Nasopharyngeal swab specimens and should not be used as a sole basis for treatment. Nasal washings and aspirates are unacceptable for Xpert Xpress SARS-CoV-2/FLU/RSV testing.  Fact Sheet for Patients: BloggerCourse.com  Fact Sheet for Healthcare Providers: SeriousBroker.it  This test is not yet approved or cleared by the [place]  FDA and has been authorized for detection and/or diagnosis of SARS-CoV-2 by FDA under an Emergency Use Authorization (EUA). This EUA will remain in effect (meaning this test can be used) for the duration of the COVID-19 declaration under Section 564(b)(1) of the Act, 21 U.S.C. section 360bbb-3(b)(1), unless the authorization is terminated or revoked.     Resp Syncytial Virus by PCR NEGATIVE NEGATIVE Final    Comment: (NOTE) Fact Sheet for Patients: BloggerCourse.com  Fact Sheet for Healthcare Providers: SeriousBroker.it  This test is not yet approved or cleared by the [place]  FDA and has been authorized for detection and/or diagnosis of SARS-CoV-2 by FDA under an Emergency Use Authorization (EUA). This EUA will remain in effect (meaning this test can be used) for  the duration of the COVID-19 declaration under Section 564(b)(1) of the Act, 21 U.S.C. section 360bbb-3(b)(1), unless the authorization is terminated or revoked.  Performed at [facility] [address], [place], [place] [address]   Urine Culture     Status: Abnormal   Collection Time: 08/06/23  5:06 AM   Specimen: Urine, Catheterized  Result Value Ref Range Status   Specimen Description   Final    URINE, CATHETERIZED Performed at [facility], [address], [place], [place] [address]    Special Requests   Final    NONE Performed at [facility], [address] [place], [place] [address]    Culture (A)  Final    <10,000 COLONIES/mL INSIGNIFICANT GROWTH Performed at [facility], [address] [place], [place] [address]    Report Status 08/07/2023 FINAL  Final  MRSA Next Gen  by PCR, Nasal     Status: None   Collection Time: 08/07/23  7:42 AM   Specimen: Nasal Mucosa; Nasal Swab  Result Value Ref Range Status   MRSA by PCR Next Gen NOT DETECTED NOT DETECTED Final    Comment: (NOTE) The GeneXpert MRSA Assay (FDA approved for NASAL specimens only), is one component of a comprehensive MRSA colonization surveillance program. It is not intended to diagnose MRSA infection nor to guide or monitor treatment for MRSA infections. Test performance is not FDA approved in patients less than 53 years old. Performed at [facility], [address] [place], [place] [address]     Labs: CBC: No results for input(s): WBC, NEUTROABS, HGB, HCT, MCV, PLT in the last 168 hours. Basic Metabolic Panel: Recent Labs  Lab 08/07/23 0249 08/08/23 0427 08/08/23 1752 08/09/23 0550 08/10/23 0347 08/10/23 0348 08/11/23 0533 08/12/23 0300 08/13/23 0404  NA 149*   < >  --  147*  --  145 146* 148* 144  K 3.2*   < > 3.5 3.5  --  3.8 3.7 3.8 3.2*  CL 118*   < >  --  117*  --  119* 115* 117* 114*  CO2 20*   < >  --  24  --  21* 22 20*  25  GLUCOSE 92   < >  --  125*  --  109* 116* 108* 96  BUN 32*   < >  --  20  --  16 16 11 9   CREATININE 1.00   < >  --  0.72  --  0.60* 0.72 0.59* 0.54*  CALCIUM  8.0*   < >  --  8.1*  --  8.4* 8.3* 8.5* 8.3*  MG 2.0  --  2.2 1.9 2.0  --   --  1.9  --   PHOS  --   --   --   --   --   --   --  2.3* 3.1   < > = values in this interval not displayed.   Liver Function Tests: Recent Labs  Lab 08/12/23 0300  ALBUMIN 2.2*   CBG: Recent Labs  Lab 08/08/23 0810 08/08/23 1119 08/08/23 1552 08/09/23 0830 08/10/23 1001  GLUCAP 109* 126* 114* 131* 90    Discharge time spent: greater than 30 minutes.  Signed: Aimee Somerset, MD [facility] 08/13/2023

## 2023-09-16 DEATH — deceased
